# Patient Record
Sex: Female | Born: 1990 | Race: White | Hispanic: No | Marital: Married | Smoking: Former smoker
Health system: Southern US, Community
[De-identification: ages and names within clinical notes are randomized; demographics above are authoritative.]

## PROBLEM LIST (undated history)

## (undated) DIAGNOSIS — G43909 Migraine, unspecified, not intractable, without status migrainosus: Secondary | ICD-10-CM

## (undated) HISTORY — PX: TONSILLECTOMY: SUR1361

---
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
## 2008-01-10 ENCOUNTER — Ambulatory Visit: Payer: Self-pay | Admitting: Obstetrics and Gynecology

## 2008-01-10 IMAGING — US US PELV - US TRANSVAGINAL
1 series · 17 of 25 positions shown · non-contrast
Comparison: none

REASON FOR EXAM: pelvic pain
COMMENTS:

[Series 1: us pelv - us transvaginal · 17 of 46 slices shown]
[im 1/46]
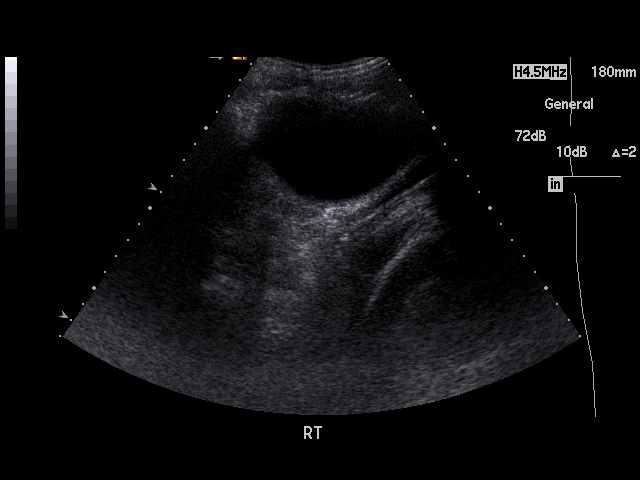
[im 4/46]
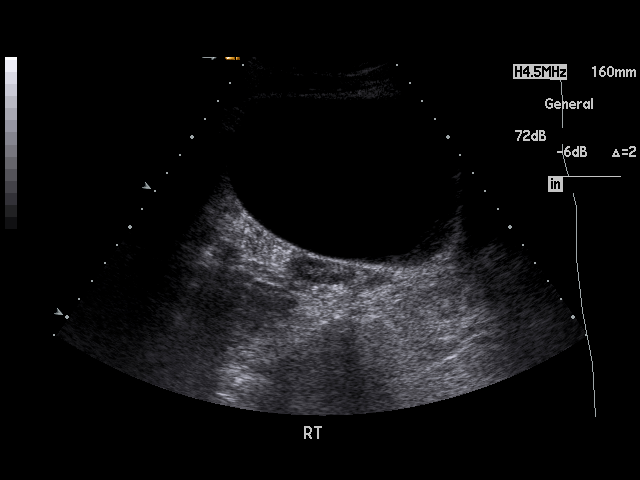
[im 6/46]
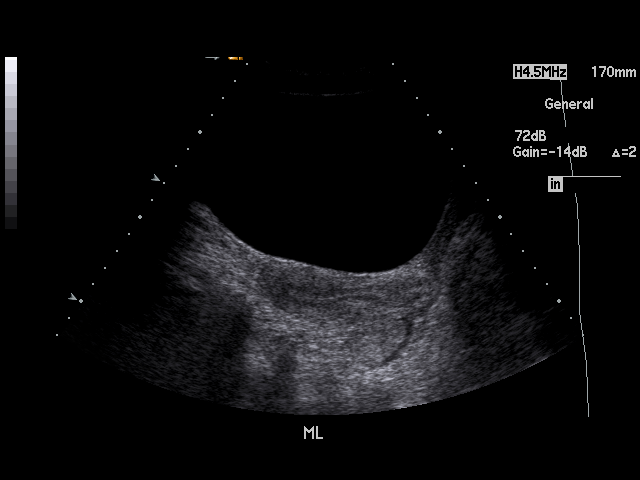
[im 10/46]
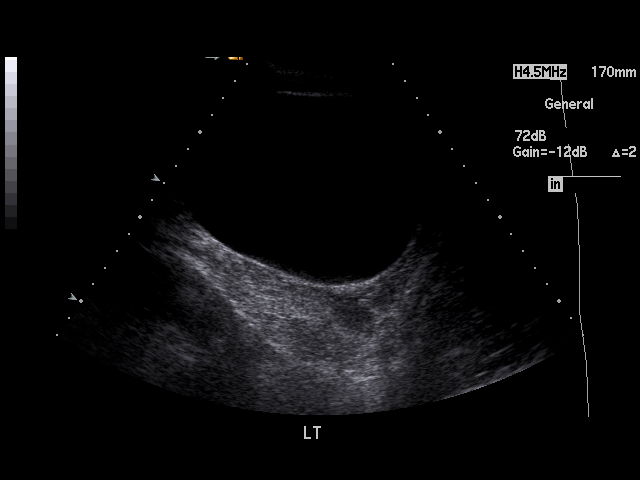
[im 12/46]
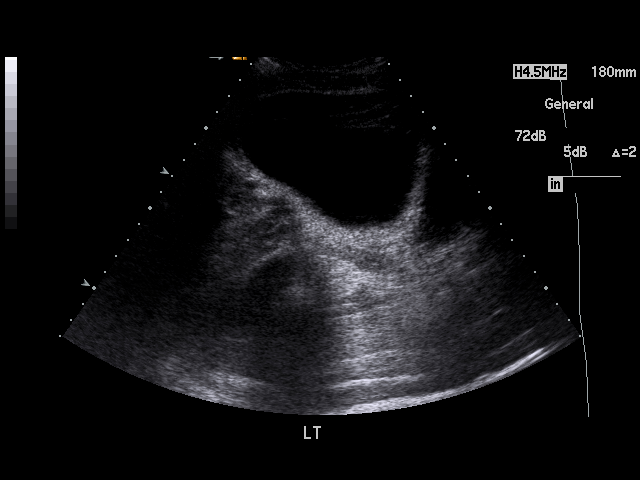
[im 16/46]
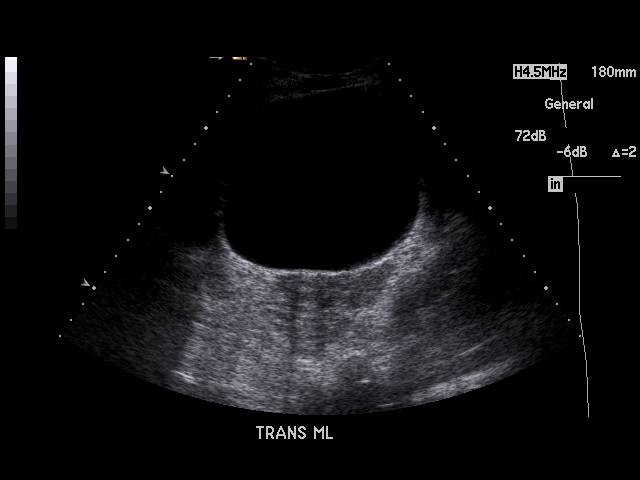
[im 17/46]
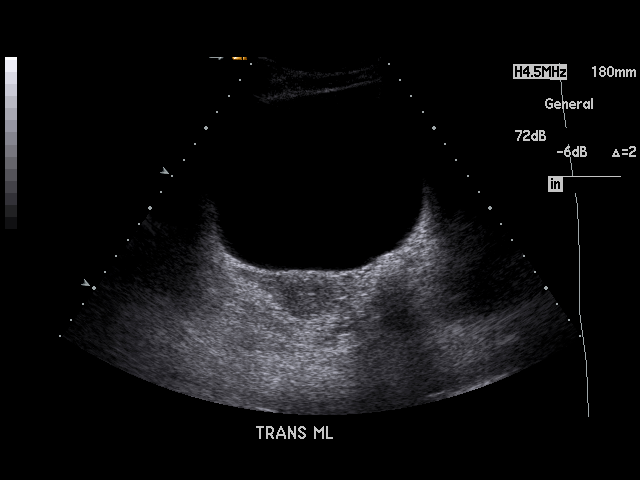
[im 21/46]
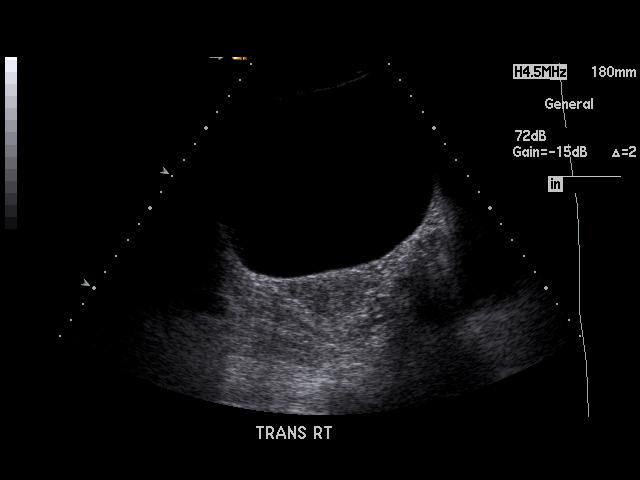
[im 23/46]
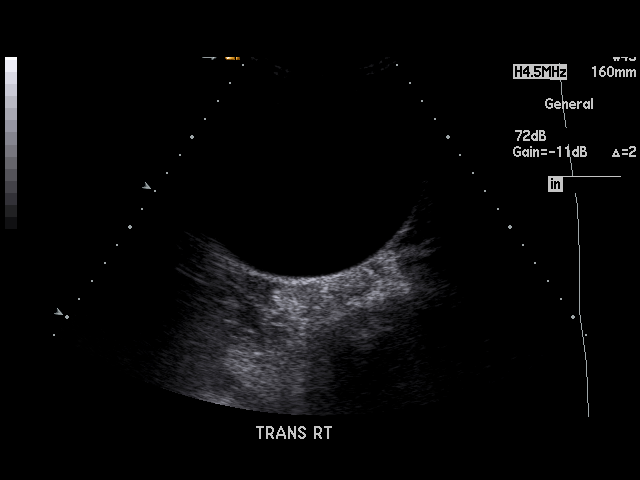
[im 25/46]
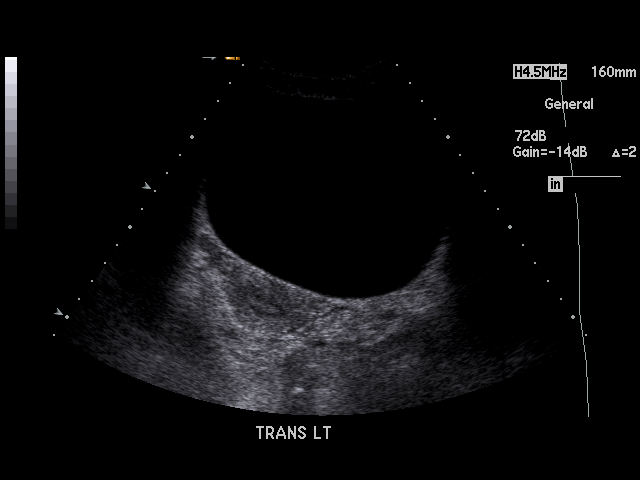
[im 29/46]
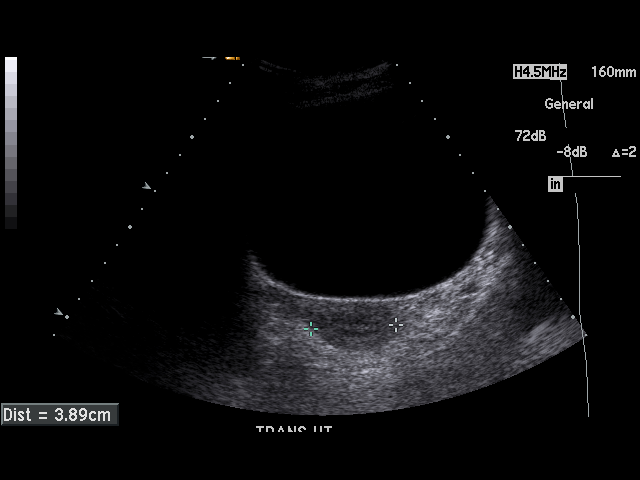
[im 31/46]
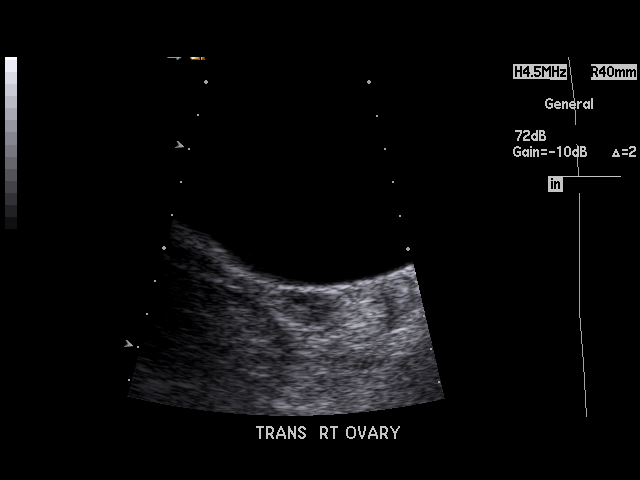
[im 34/46]
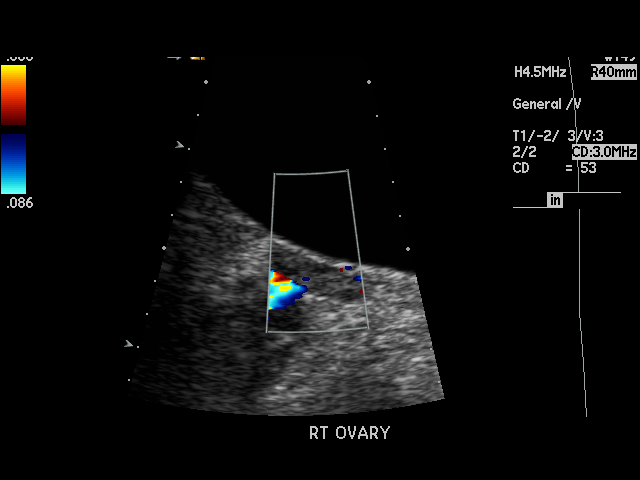
[im 36/46]
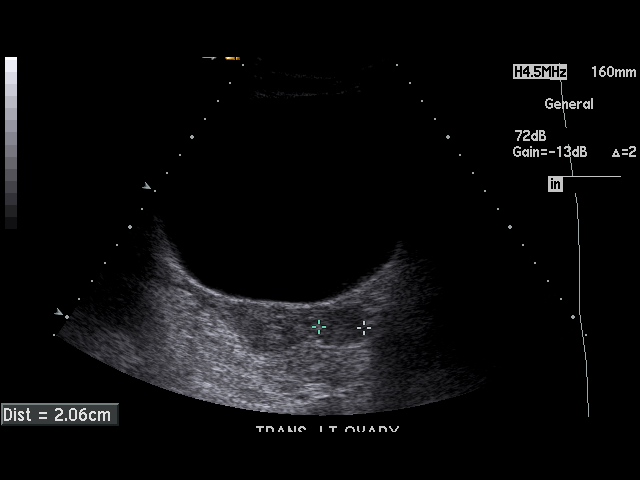
[im 40/46]
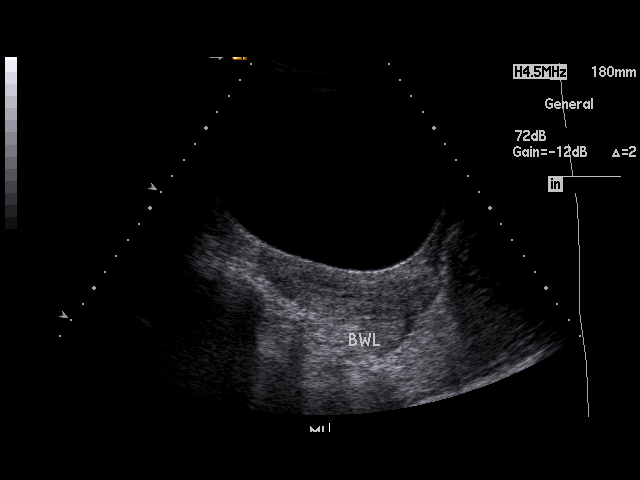
[im 42/46]
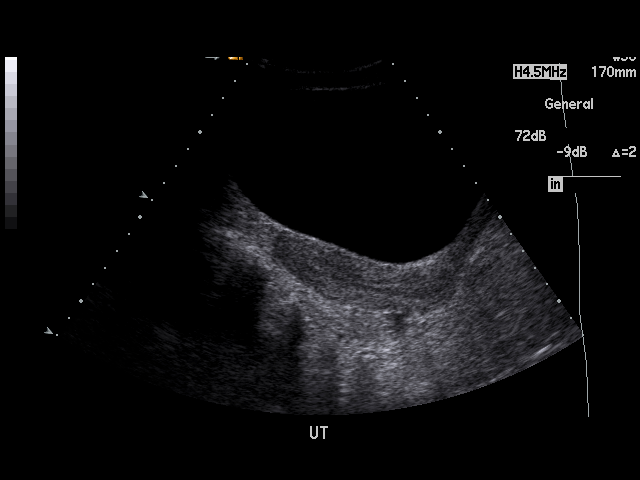
[im 46/46]
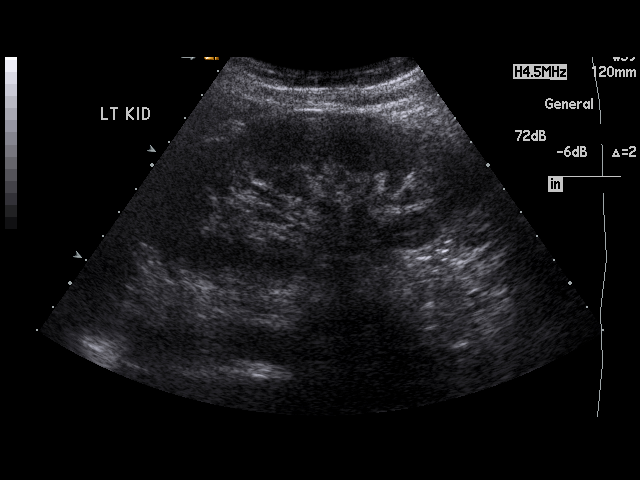

[17 of 25 positions shown; findings below may reference images not displayed]

PROCEDURE:     US  - US PELVIS MASS EXAM  - [DATE] [DATE] [DATE]  [DATE]

RESULT:     The uterus measures 8.75 cm x 4.41 cm x 2.53 cm. No uterine mass
lesions are seen. The endometrium measures 4.5 mm in thickness. The RIGHT
and LEFT ovaries are visualized. The RIGHT ovary measures 3.01 cm at maximum
diameter and the LEFT ovary measures 2.51 cm at maximum diameter.  No
abnormal adnexal masses are seen. There is a nonspecific trace of free fluid
in the cul-de-sac. The visualized portion of the urinary bladder is normal
in appearance. The kidneys are visualized bilaterally and show no
hydronephrosis.
IMPRESSION: 1.     No significant abnormalities are noted.

## 2008-03-19 ENCOUNTER — Emergency Department: Payer: Self-pay | Admitting: Emergency Medicine

## 2009-06-04 ENCOUNTER — Emergency Department: Payer: Self-pay | Admitting: Unknown Physician Specialty

## 2010-04-07 ENCOUNTER — Ambulatory Visit: Payer: Self-pay | Admitting: Gastroenterology

## 2010-04-22 ENCOUNTER — Ambulatory Visit: Payer: Self-pay | Admitting: Gastroenterology

## 2010-04-22 IMAGING — CR DG SMALL BOWEL
1 series · 4 of 4 positions shown · non-contrast
Comparison: none

REASON FOR EXAM: CHRONIC COLITIS
COMMENTS:

PROCEDURE:     FL  - FL SMALL BOWEL  - [DATE]  [DATE]
RESULT:     Indication: Chronic colitis

[Series 1: view not recorded · 0.17mm/px · 4 of 4 slices shown]
[im 1/4]
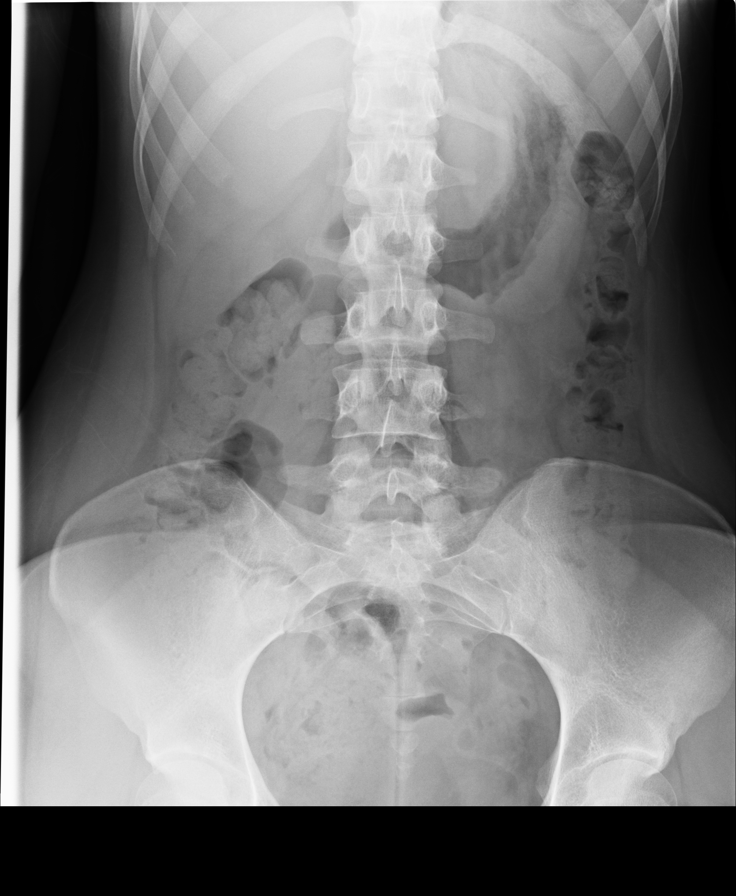
[im 2/4]
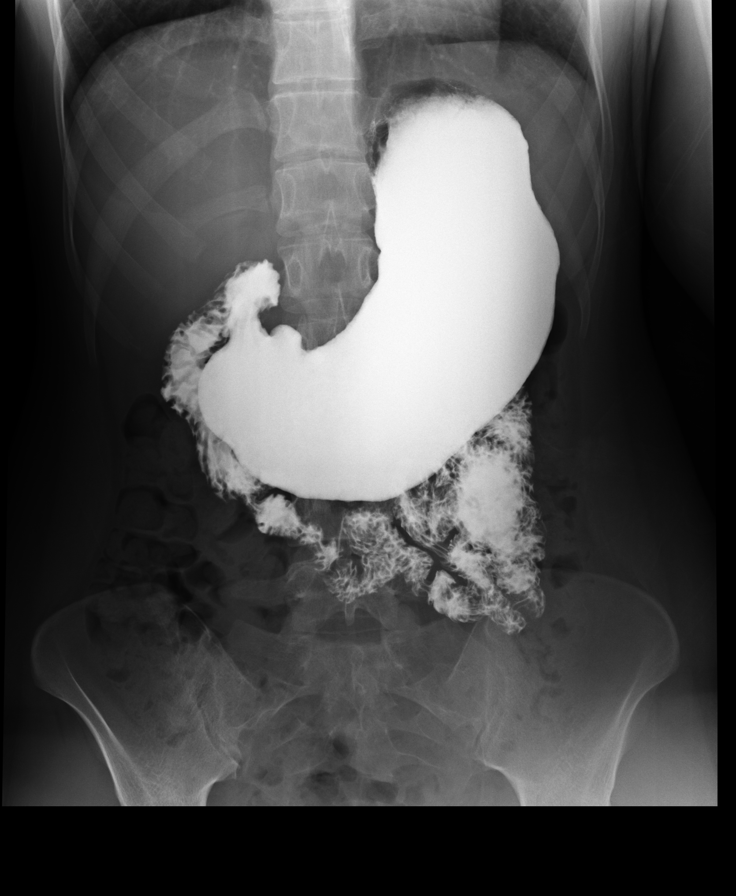
[im 3/4]
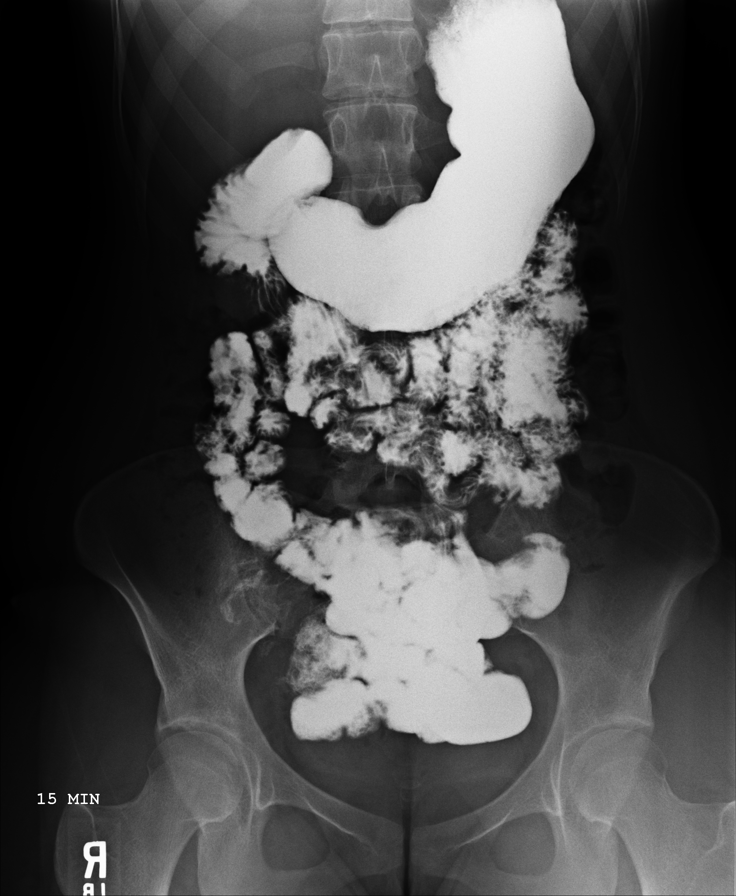
[im 4/4]
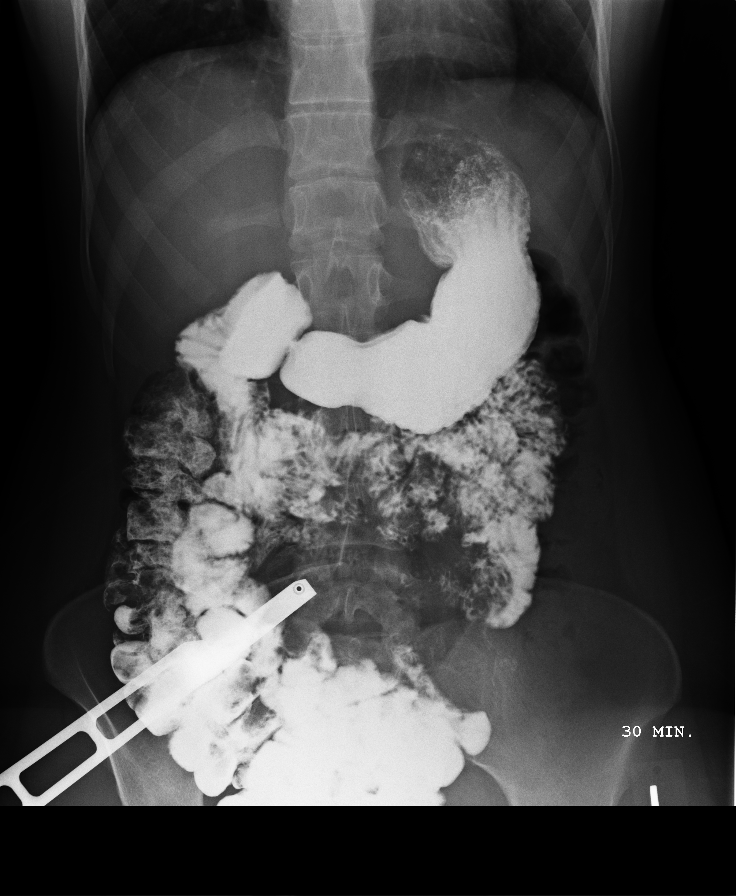

[4 of 4 positions shown; findings below may reference images not displayed]

FINDINGS: Scout frontal abdominal and pelvic radiograph demonstrates no focal
abnormality.

Medium density barium was periodically observed under fluoroscopy to travel
from the stomach to the ascending colon (over a 30 minute time period).
There is no evidence of small bowel stricture or obstruction. No large
filling defects to suggest mass lesion. In addition, there is no evidence of
tethering or definite inflammatory changes present within the small bowel.
IMPRESSION: Normal small bowel follow-through study without evidence of tethering, mass
lesion, or obstruction within the small bowel.

## 2010-04-22 IMAGING — RF DG SMALL BOWEL
1 series · 5 of 5 positions shown · non-contrast
Comparison: none

REASON FOR EXAM: CHRONIC COLITIS
COMMENTS:

PROCEDURE:     FL  - FL SMALL BOWEL  - [DATE]  [DATE]
RESULT:     Indication: Chronic colitis

[Series 1: run · 5 of 5 slices shown]
[im 1/5]
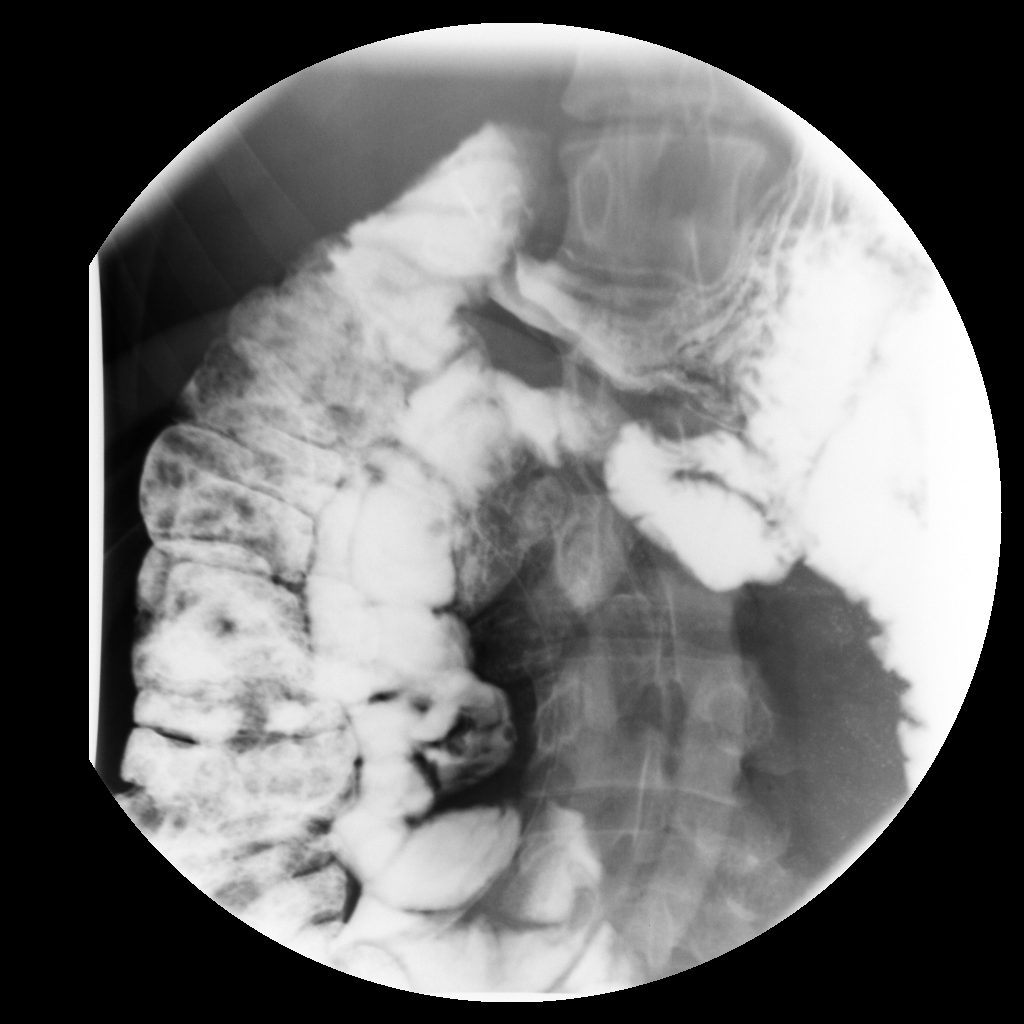
[im 2/5]
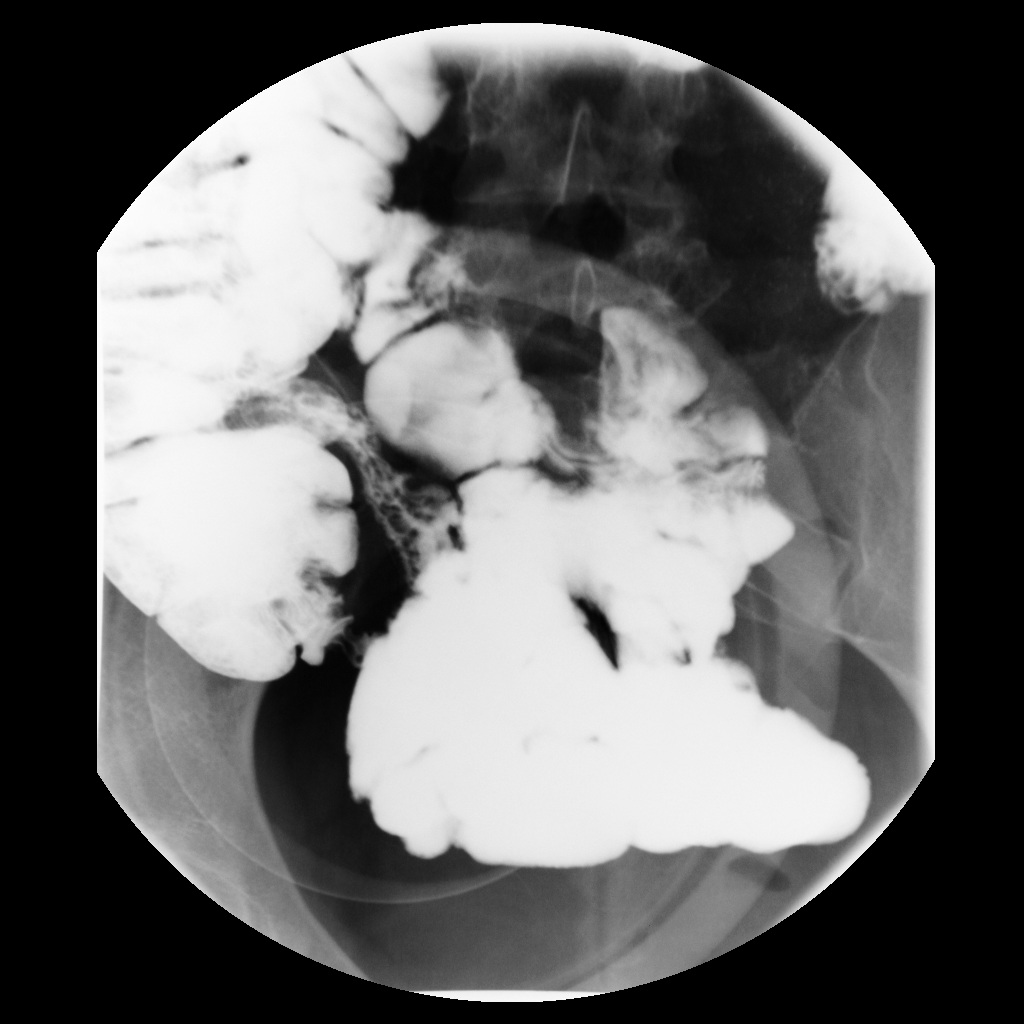
[im 3/5]
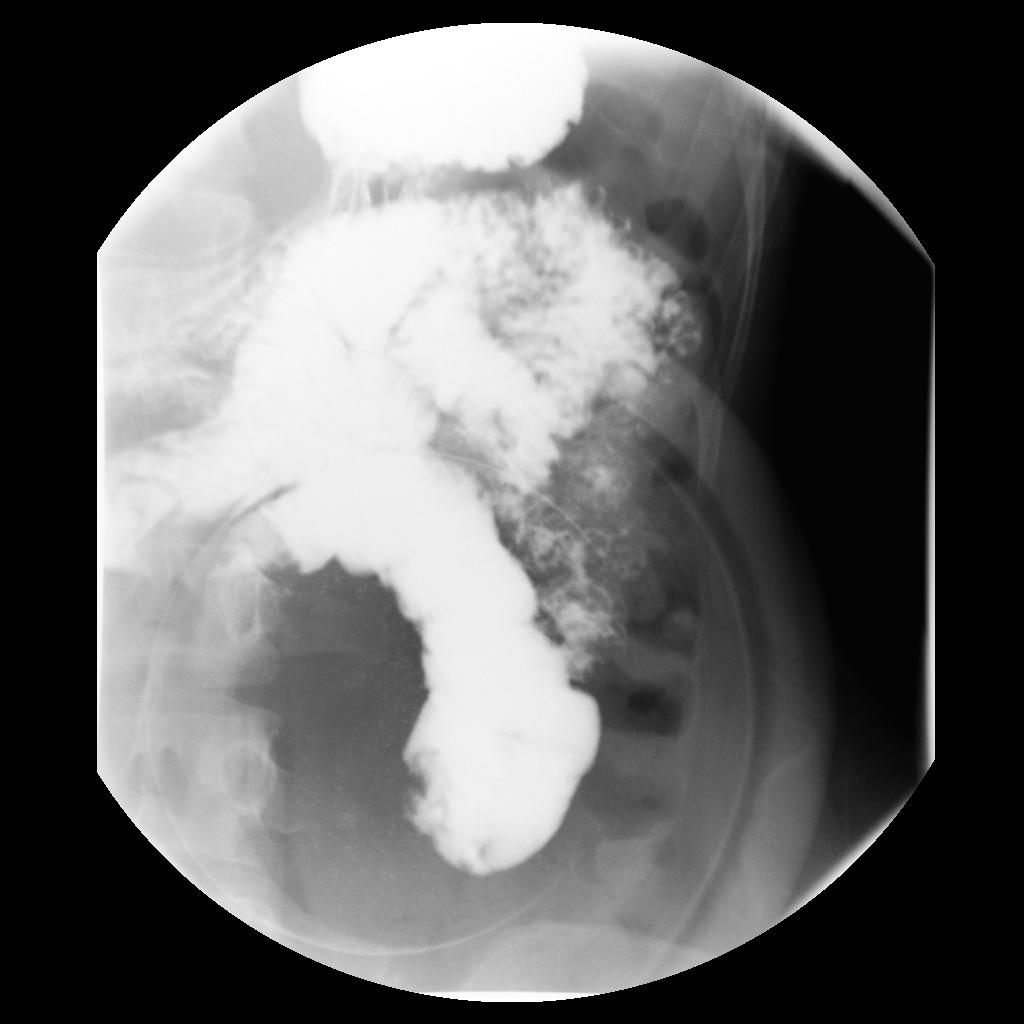
[im 4/5]
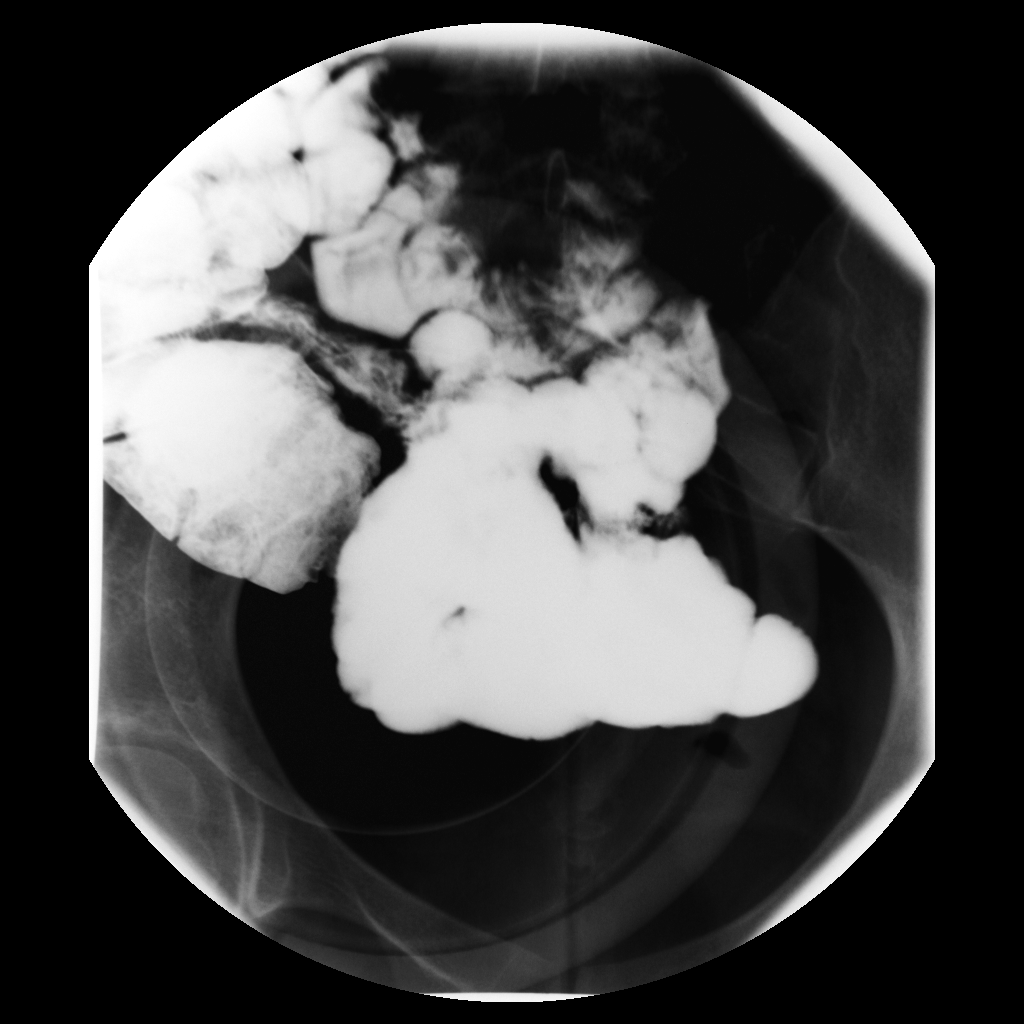
[im 5/5]
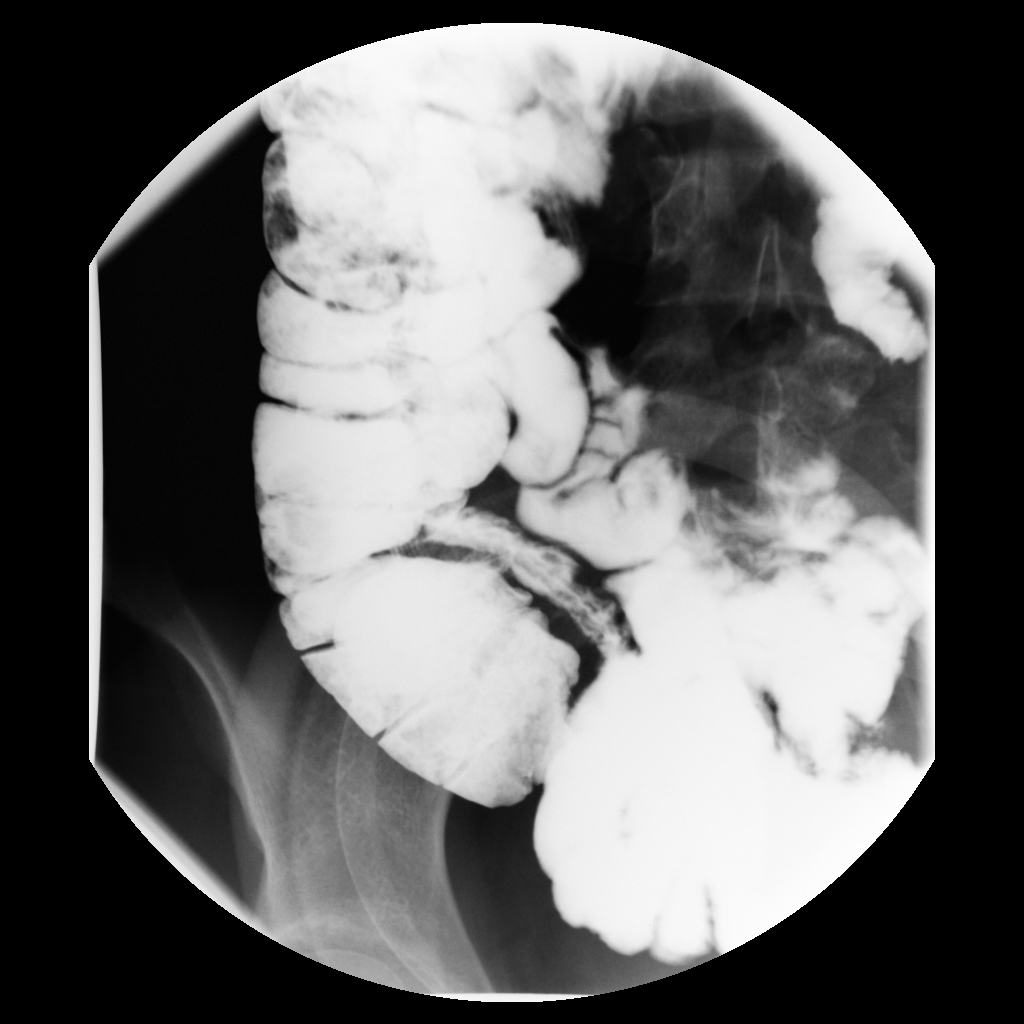

[5 of 5 positions shown; findings below may reference images not displayed]

FINDINGS: Scout frontal abdominal and pelvic radiograph demonstrates no focal
abnormality.

Medium density barium was periodically observed under fluoroscopy to travel
from the stomach to the ascending colon (over a 30 minute time period).
There is no evidence of small bowel stricture or obstruction. No large
filling defects to suggest mass lesion. In addition, there is no evidence of
tethering or definite inflammatory changes present within the small bowel.
IMPRESSION: Normal small bowel follow-through study without evidence of tethering, mass
lesion, or obstruction within the small bowel.

## 2010-05-14 ENCOUNTER — Ambulatory Visit: Payer: Self-pay | Admitting: Gastroenterology

## 2010-05-19 ENCOUNTER — Ambulatory Visit: Payer: Self-pay | Admitting: Gastroenterology

## 2010-05-19 IMAGING — CT CT ABD-PELV W/ CM
1 of 2 series · 15 of 32 positions shown, 19 images · non-contrast
Comparison: none

REASON FOR EXAM: LLQ abd pain
COMMENTS:

PROCEDURE:     CT  - CT ABDOMEN / PELVIS  W  - [DATE]  [DATE]
RESULT:
TECHNIQUE: CT of the abdomen and pelvis is performed utilizing 85 ml of
[7O] iodinated intravenous contrast along with oral contrast. Images
are reconstructed in the axial plane at 5 mm slice thickness.
There is no previous exam for comparison.

[Series 2: soft tissue · axial · 0.69mm/px · z∈[-310,+76]mm · 15 of 85 slices shown, 19 images]
[im 4/85  soft-tissue]
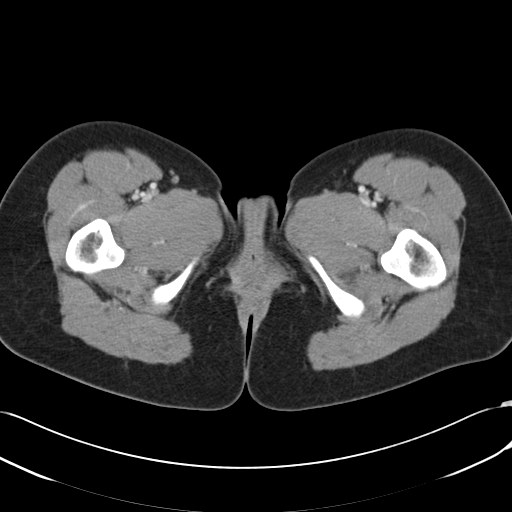
[im 4/85  bone]
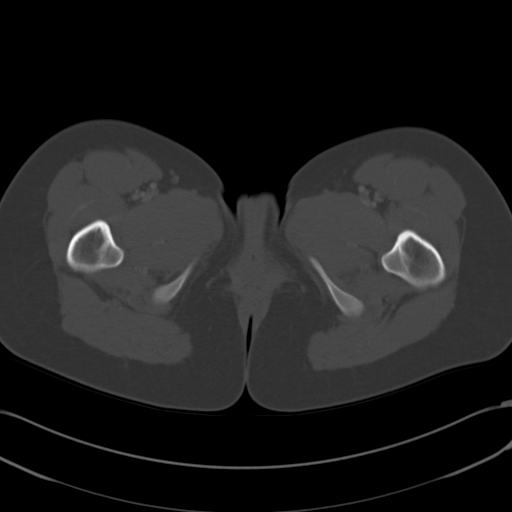
[im 11/85  soft-tissue]
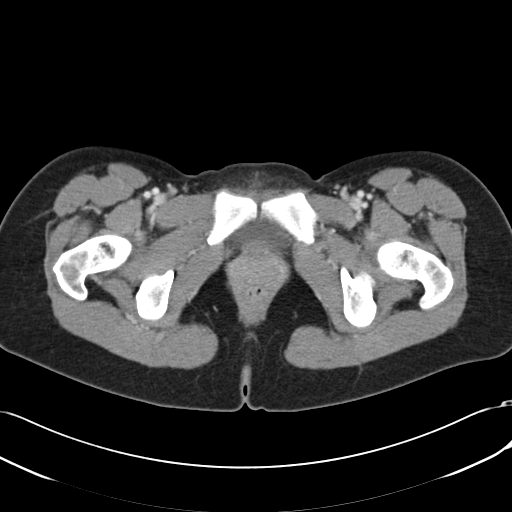
[im 17/85  soft-tissue]
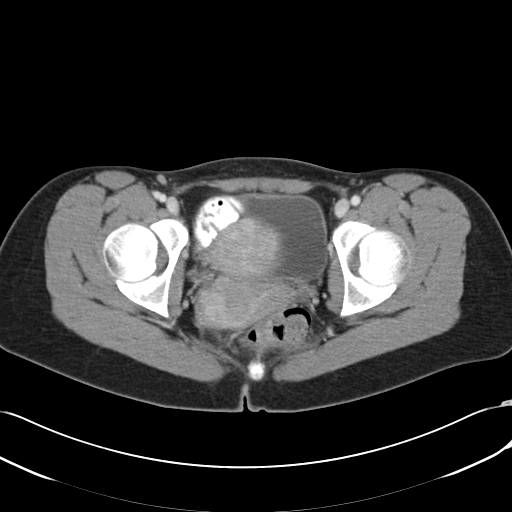
[im 24/85  soft-tissue]
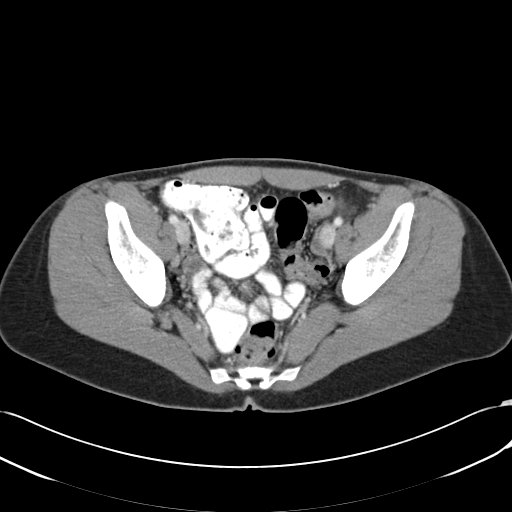
[im 31/85  soft-tissue]
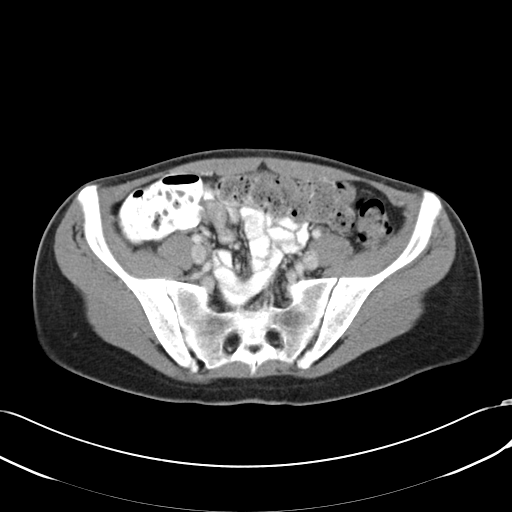
[im 37/85  soft-tissue]
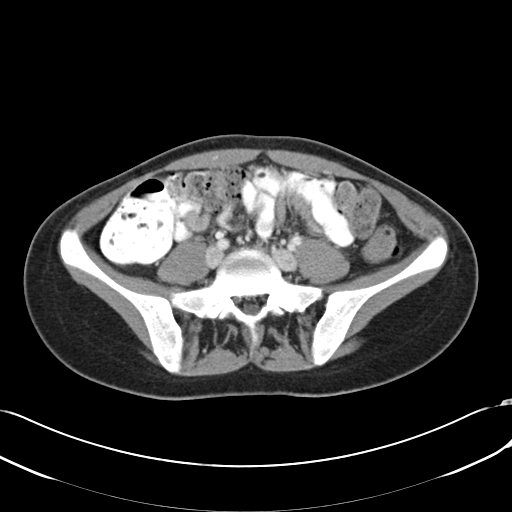
[im 44/85  soft-tissue]
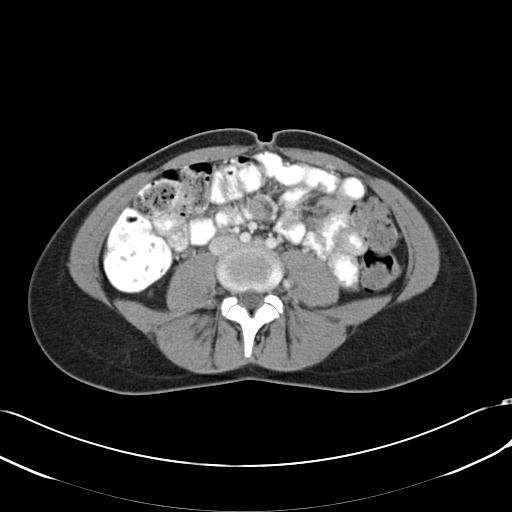
[im 48/85  soft-tissue]
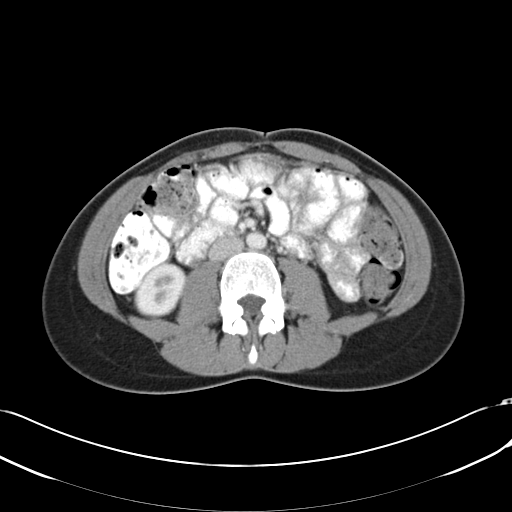
[im 54/85  soft-tissue]
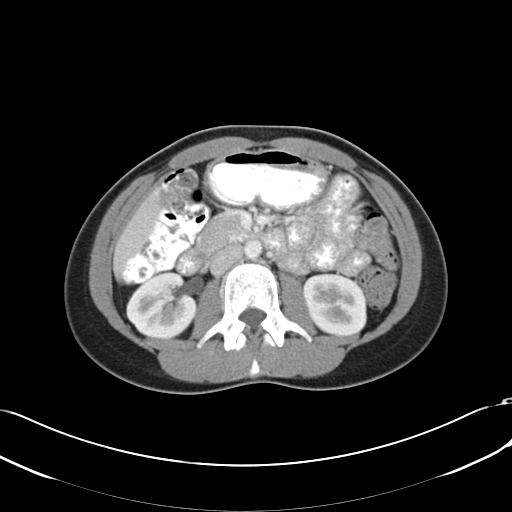
[im 54/85  bone]
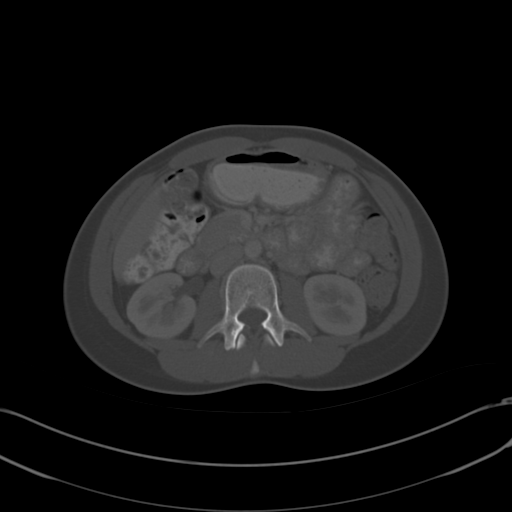
[im 61/85  soft-tissue]
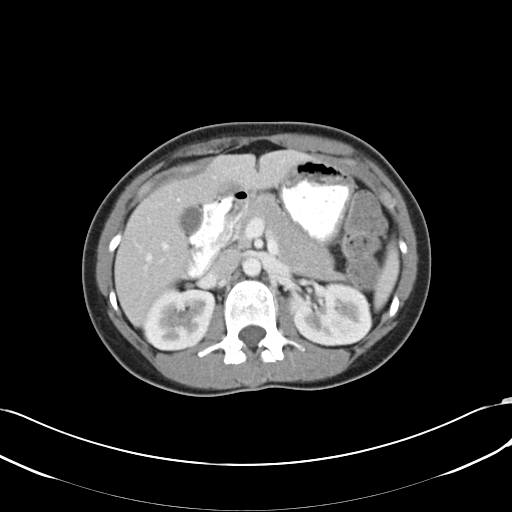
[im 68/85  soft-tissue]
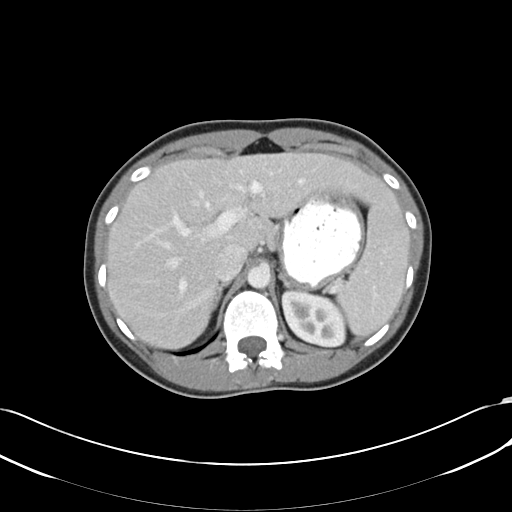
[im 71/85  lung]
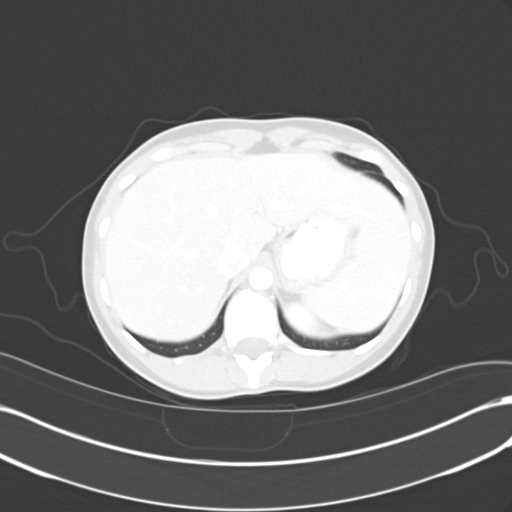
[im 74/85  soft-tissue]
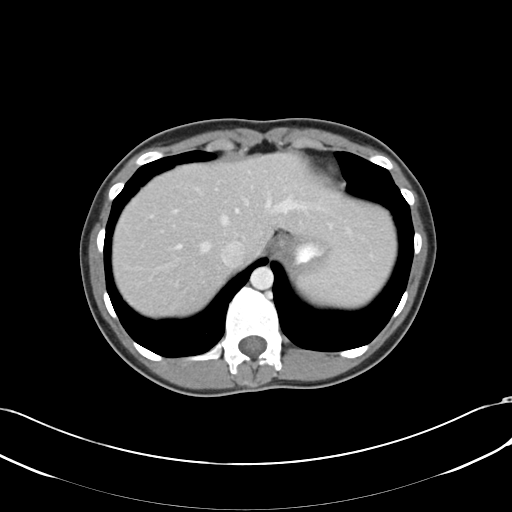
[im 74/85  lung]
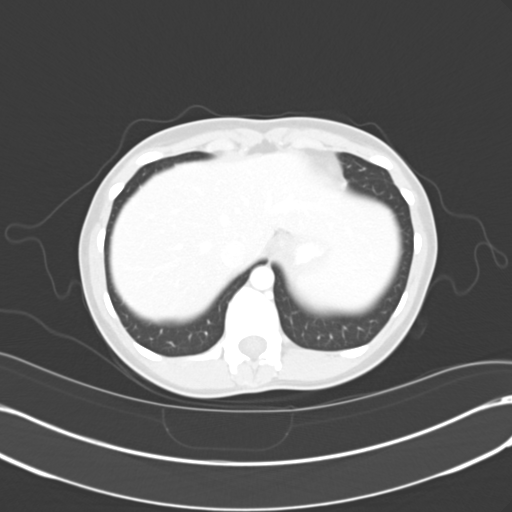
[im 78/85  lung]
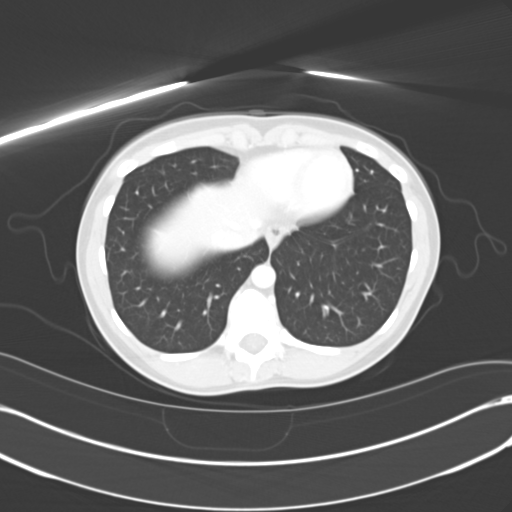
[im 81/85  soft-tissue]
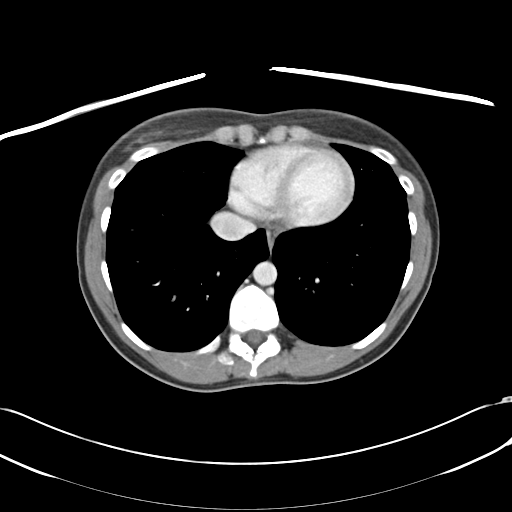
[im 81/85  lung]
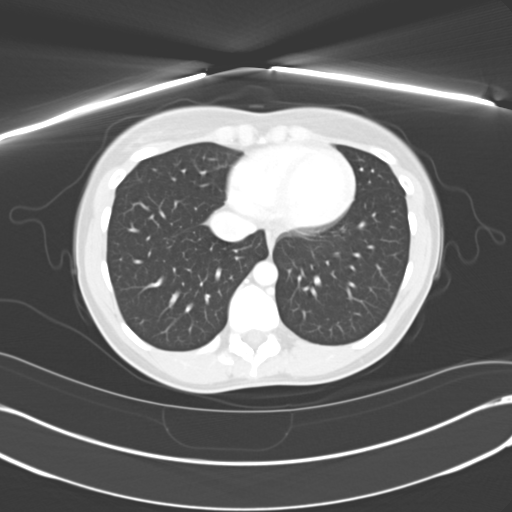

[15 of 32 positions shown; findings below may reference images not displayed]

FINDINGS: Images through the base of the lungs demonstrate normal appearing
aeration. There is no pleural or pericardial effusion. The liver and spleen
appear to be within normal limits. The pancreas, gallbladder, adrenal
glands, kidneys, aorta and bowel appear to be unremarkable. There is no
abnormal bowel distention, wall thickening, abnormal fluid collection or
free air. The urinary bladder is nondistended. The uterus appears to be
within normal limits. There is no free air or free fluid. There is what
appears to be a normal appearing appendix. No adenopathy is evident.
IMPRESSION: No acute abnormality demonstrated in the abdomen or pelvis.
No findings to suggest colitis or diverticulitis. No enteritis evident.

## 2012-09-15 LAB — COMPREHENSIVE METABOLIC PANEL
Alkaline Phosphatase: 122 U/L (ref 50–136)
BUN: 8 mg/dL (ref 7–18)
Bilirubin,Total: 0.5 mg/dL (ref 0.2–1.0)
Calcium, Total: 9.1 mg/dL (ref 8.5–10.1)
Chloride: 107 mmol/L (ref 98–107)
Co2: 22 mmol/L (ref 21–32)
Creatinine: 0.65 mg/dL (ref 0.60–1.30)
EGFR (African American): >60
EGFR (Non-African Amer.): >60
Osmolality: 274 (ref 275–301)
SGOT(AST): 23 U/L (ref 15–37)
SGPT (ALT): 18 U/L (ref 12–78)
Sodium: 138 mmol/L (ref 136–145)
Total Protein: 8.7 g/dL — ABNORMAL HIGH (ref 6.4–8.2)

## 2012-09-15 LAB — TSH: Thyroid Stimulating Horm: 0.69 u[IU]/mL

## 2012-09-15 LAB — DRUG SCREEN, URINE
Amphetamines, Ur Screen: NEGATIVE (ref ?–1000)
Barbiturates, Ur Screen: NEGATIVE (ref ?–200)
Benzodiazepine, Ur Scrn: NEGATIVE (ref ?–200)
Cannabinoid 50 Ng, Ur ~~LOC~~: NEGATIVE (ref ?–50)
Cocaine Metabolite,Ur ~~LOC~~: NEGATIVE (ref ?–300)
MDMA (Ecstasy)Ur Screen: NEGATIVE (ref ?–500)
Tricyclic, Ur Screen: NEGATIVE (ref ?–1000)

## 2012-09-15 LAB — URINALYSIS, COMPLETE
Bacteria: NONE SEEN
Bilirubin,UR: NEGATIVE
Glucose,UR: NEGATIVE mg/dL (ref 0–75)
Leukocyte Esterase: NEGATIVE
Nitrite: NEGATIVE
Protein: NEGATIVE
RBC,UR: 10 /HPF (ref 0–5)
Specific Gravity: 1.043 (ref 1.003–1.030)
Squamous Epithelial: 2
WBC UR: 1 /HPF (ref 0–5)

## 2012-09-15 LAB — ACETAMINOPHEN LEVEL: Acetaminophen: 82 ug/mL — ABNORMAL HIGH

## 2012-09-15 LAB — ETHANOL
Ethanol %: <0.003 % (ref 0.000–0.080)
Ethanol: <3 mg/dL

## 2012-09-15 LAB — CBC
HGB: 15 g/dL (ref 12.0–16.0)
MCH: 29.3 pg (ref 26.0–34.0)
MCV: 85 fL (ref 80–100)
RBC: 5.11 10*6/uL (ref 3.80–5.20)

## 2012-09-16 ENCOUNTER — Inpatient Hospital Stay: Payer: Self-pay | Admitting: Psychiatry
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
## 2018-09-24 ENCOUNTER — Emergency Department: Payer: BC Managed Care – PPO

## 2018-09-24 ENCOUNTER — Emergency Department
Admission: EM | Admit: 2018-09-24 | Discharge: 2018-09-24 | Disposition: A | Discharge status: Home / Self Care | Payer: BC Managed Care – PPO | Attending: Student in an Organized Health Care Education/Training Program | Admitting: Student in an Organized Health Care Education/Training Program

## 2018-09-24 ENCOUNTER — Other Ambulatory Visit: Payer: Self-pay

## 2018-09-24 DIAGNOSIS — R0602 Shortness of breath: Secondary | ICD-10-CM | POA: Insufficient documentation

## 2018-09-24 LAB — BASIC METABOLIC PANEL
Anion gap: 9 (ref 5–15)
BUN: 11 mg/dL (ref 6–20)
CALCIUM: 9.4 mg/dL (ref 8.9–10.3)
CHLORIDE: 105 mmol/L (ref 98–111)
CO2: 24 mmol/L (ref 22–32)
CREATININE: 0.61 mg/dL (ref 0.44–1.00)
GFR calc Af Amer: >60 mL/min (ref >60)
GFR calc non Af Amer: >60 mL/min (ref >60)
Glucose, Bld: 91 mg/dL (ref 70–99)
Potassium: 3.9 mmol/L (ref 3.5–5.1)
Sodium: 138 mmol/L (ref 135–145)

## 2018-09-24 LAB — FIBRIN DERIVATIVES D-DIMER (ARMC ONLY): Fibrin derivatives D-dimer (ARMC): 262.16 ng/mL (FEU) (ref 0.00–499.00)

## 2018-09-24 LAB — CBC
HEMATOCRIT: 42.6 % (ref 36.0–46.0)
Hemoglobin: 14.1 g/dL (ref 12.0–15.0)
MCH: 28.8 pg (ref 26.0–34.0)
MCHC: 33.1 g/dL (ref 30.0–36.0)
MCV: 86.9 fL (ref 80.0–100.0)
PLATELETS: 267 10*3/uL (ref 150–400)
RBC: 4.9 MIL/uL (ref 3.87–5.11)
RDW: 13 % (ref 11.5–15.5)
WBC: 10.9 10*3/uL — ABNORMAL HIGH (ref 4.0–10.5)
nRBC: 0 % (ref 0.0–0.2)

## 2018-09-24 LAB — HCG, QUANTITATIVE, PREGNANCY: hCG, Beta Chain, Quant, S: <1 m[IU]/mL (ref <5)

## 2018-09-24 LAB — TROPONIN I: Troponin I: <0.03 ng/mL (ref <0.03)

## 2018-09-24 IMAGING — CR DG CHEST 2V
1 series · 2 of 2 positions shown · non-contrast
Comparison: None.

CLINICAL DATA: Acute chest pain and shortness of breath.

EXAM:
CHEST - 2 VIEW

[Series 1: dg chest 2 view · 0.14mm/px · 2 of 2 slices shown]
[im 1/2]
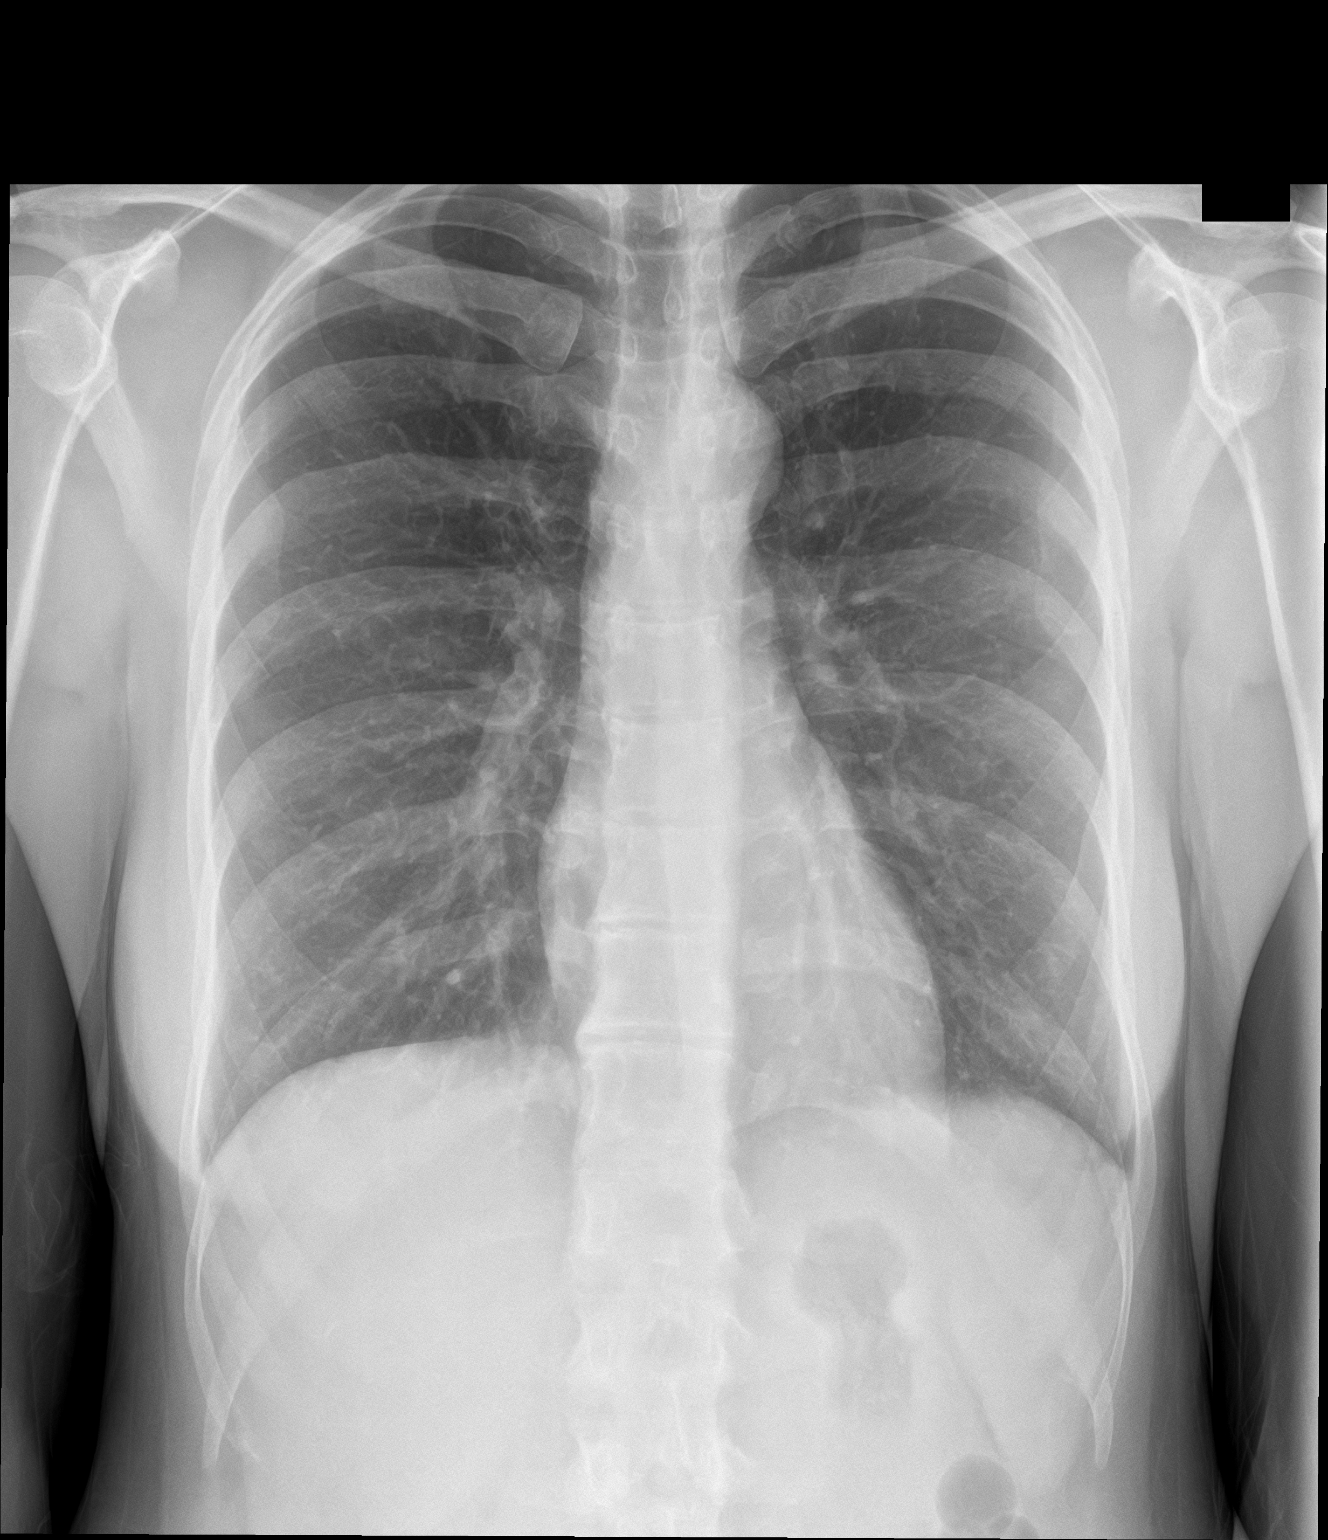
[im 2/2]
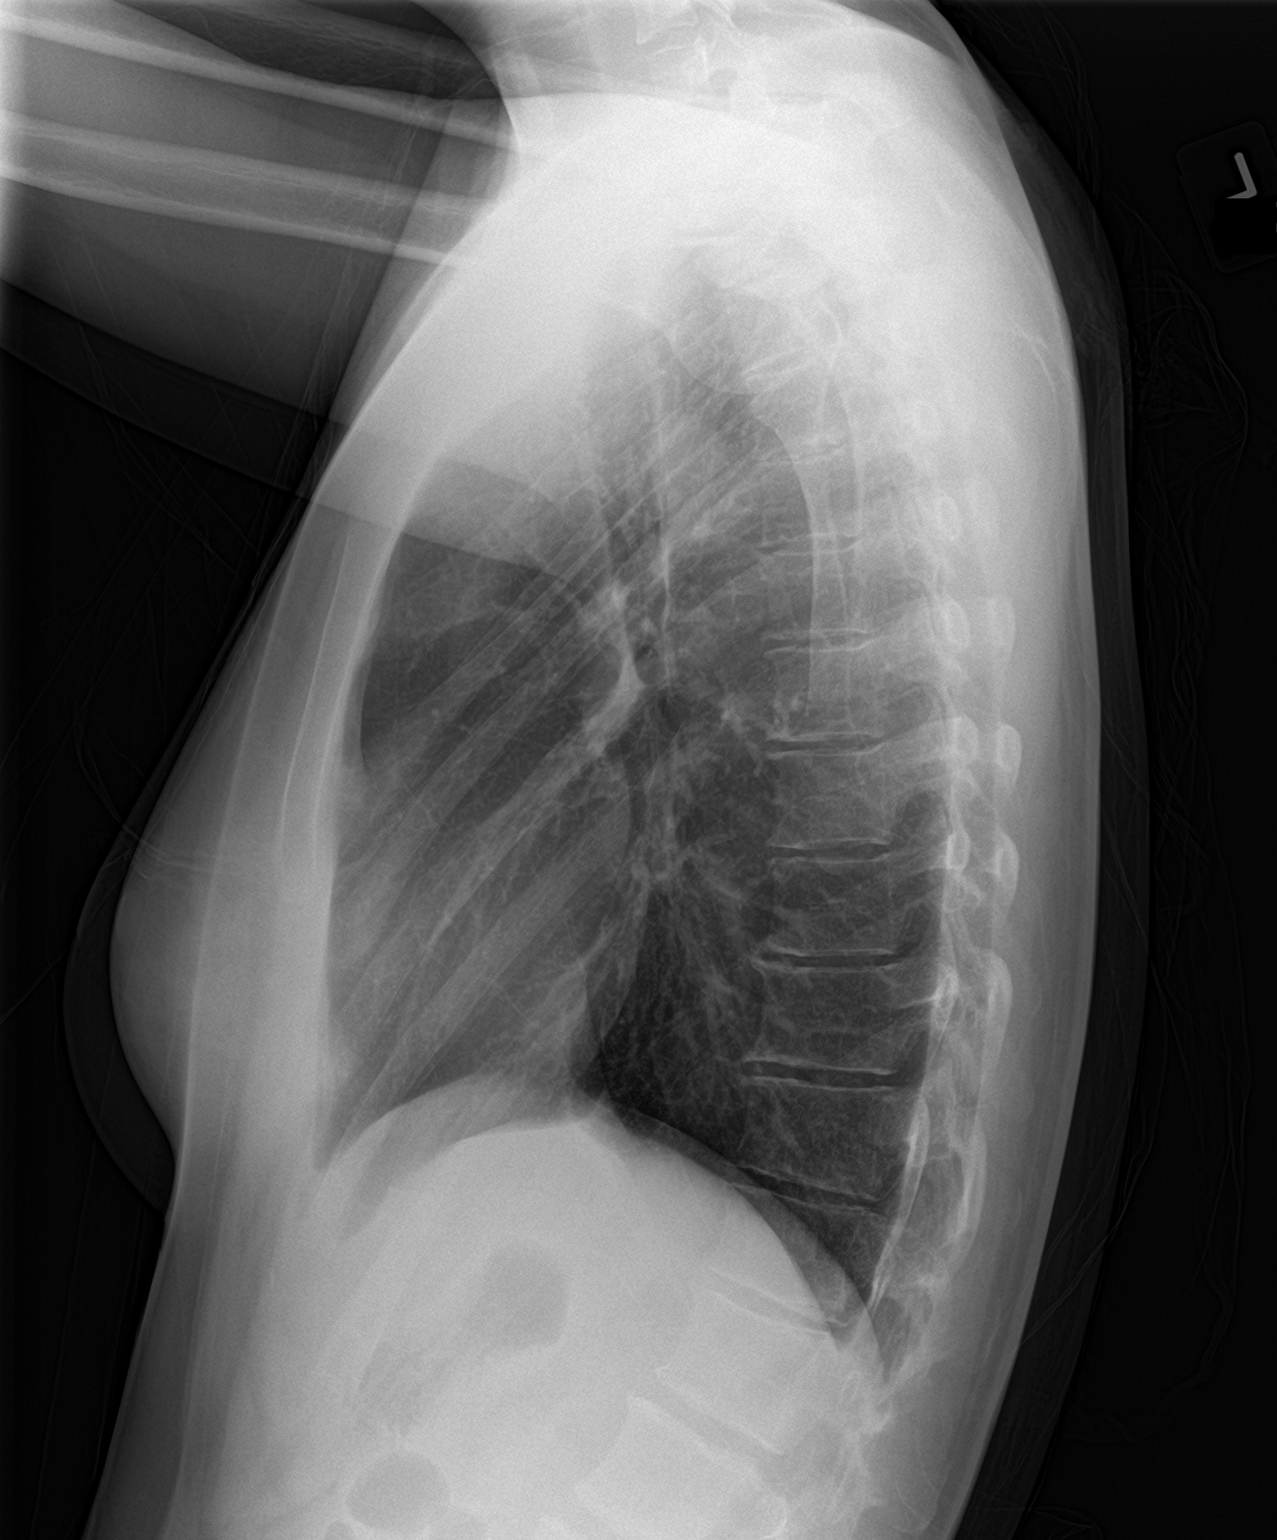

[2 of 2 positions shown; findings below may reference images not displayed]

FINDINGS: The cardiomediastinal silhouette is unremarkable.

There is no evidence of focal airspace disease, pulmonary edema,
suspicious pulmonary nodule/mass, pleural effusion, or pneumothorax.

No acute bony abnormalities are identified.
IMPRESSION: No active cardiopulmonary disease.

## 2018-09-24 IMAGING — CT CT ANGIO CHEST
2 of 7 series · 19 of 46 positions shown · IV contrast (APPLIED)
Comparison: Chest x-ray [DATE]

CLINICAL DATA: Painful breathing.  Shortness of breath.

EXAM:
CT ANGIOGRAPHY CHEST WITH CONTRAST
TECHNIQUE: Multidetector CT imaging of the chest was performed using the
standard protocol during bolus administration of intravenous
contrast. Multiplanar CT image reconstructions and MIPs were
obtained to evaluate the vascular anatomy.
CONTRAST:  75mL OMNIPAQUE IOHEXOL 350 MG/ML SOLN

[Series 6: thins · axial · 0.54mm/px · z∈[-224,+31]mm · 16 of 285 slices shown]
[im 15/285  lung]
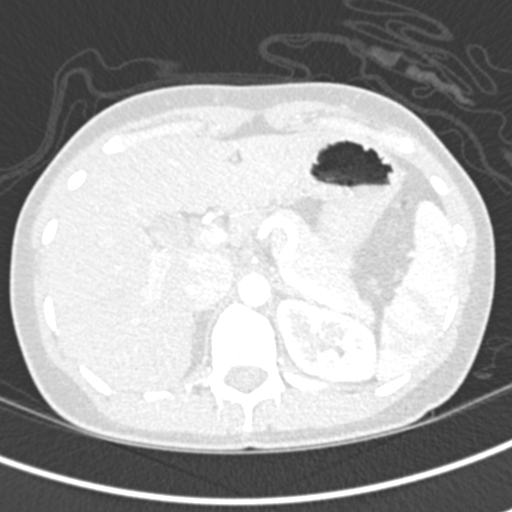
[im 30/285  soft-tissue]
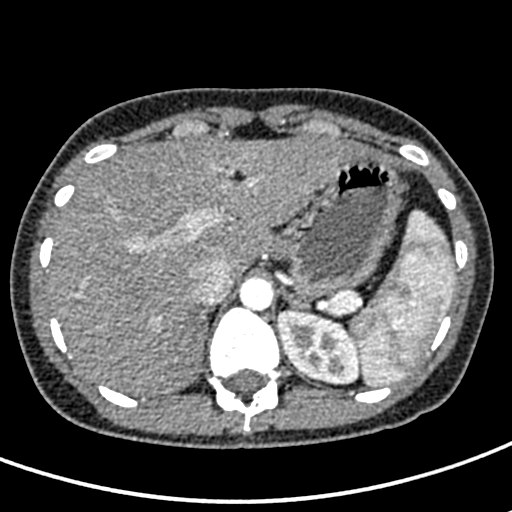
[im 45/285  lung]
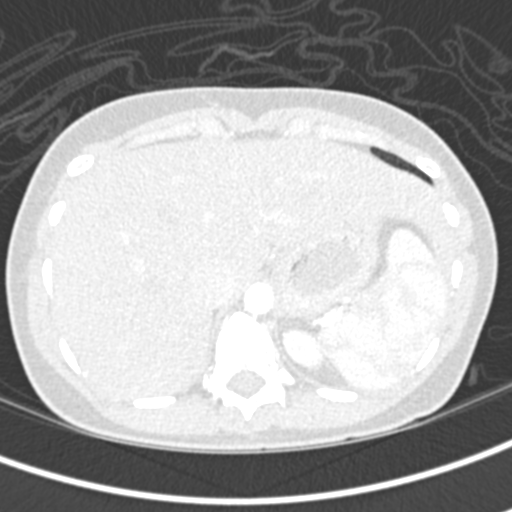
[im 60/285  soft-tissue]
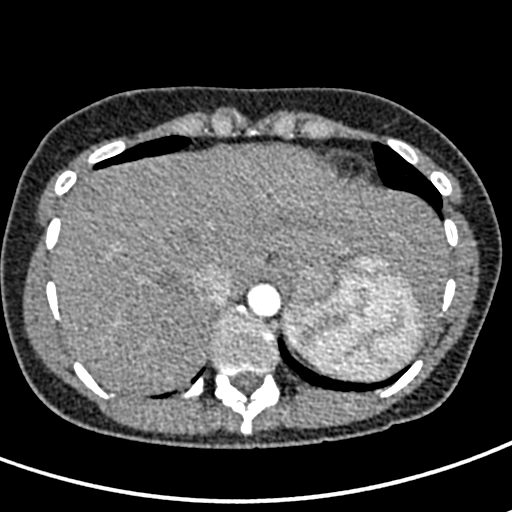
[im 90/285  lung]
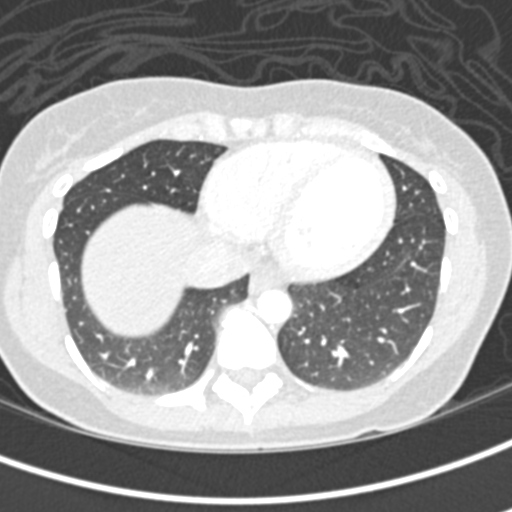
[im 105/285  soft-tissue]
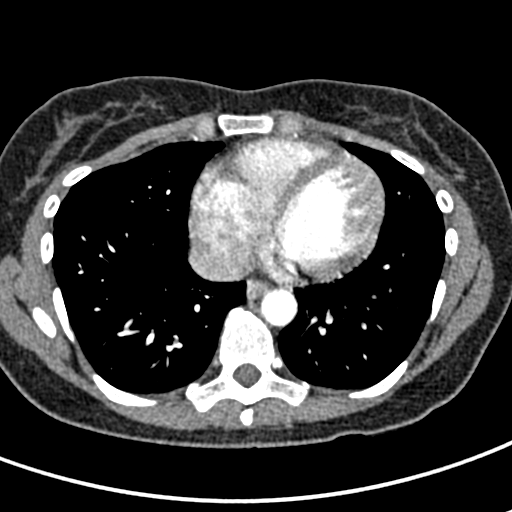
[im 120/285  lung]
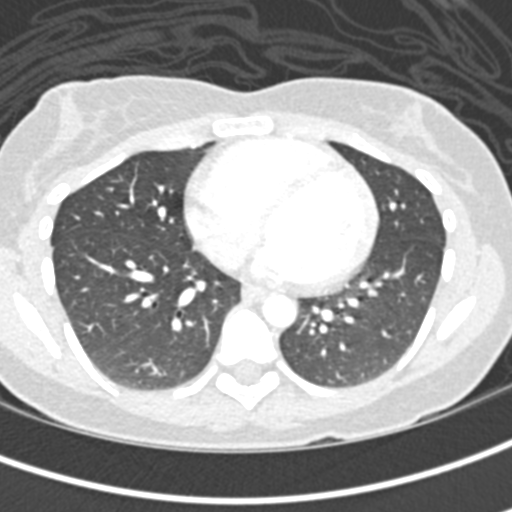
[im 135/285  soft-tissue]
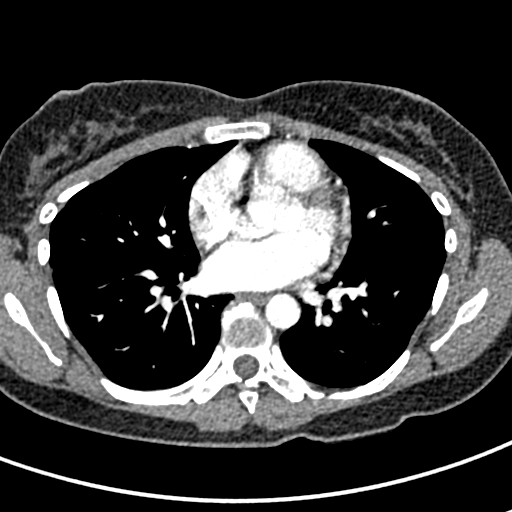
[im 150/285  lung]
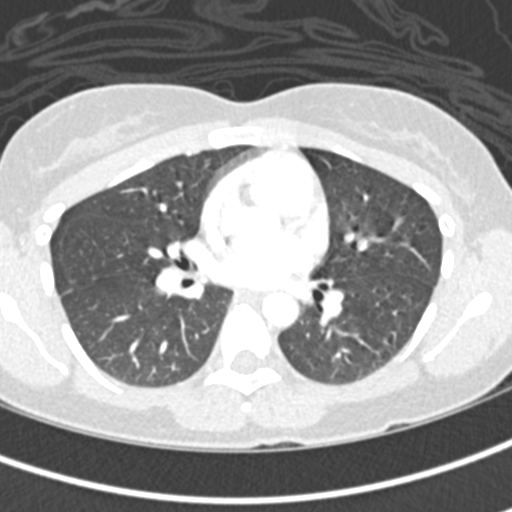
[im 165/285  soft-tissue]
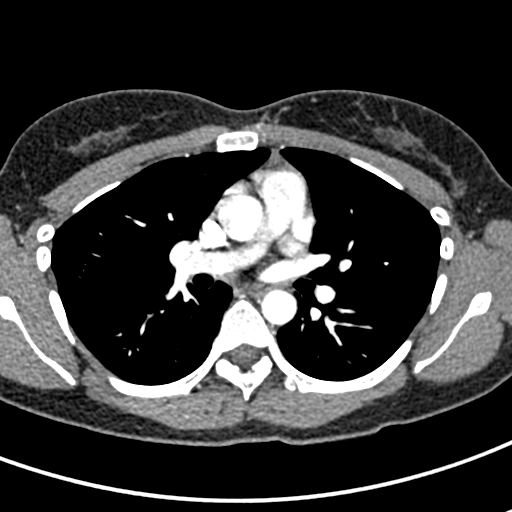
[im 180/285  lung]
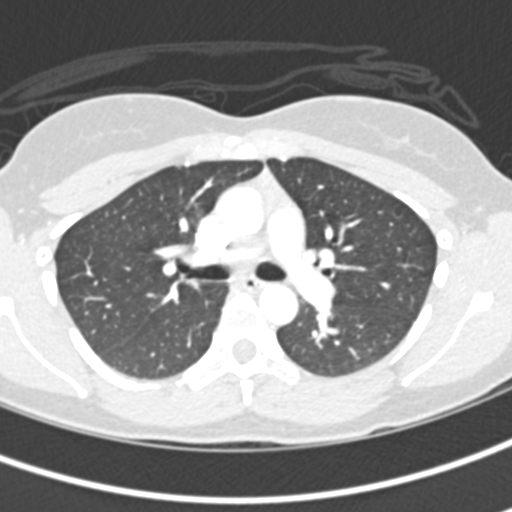
[im 195/285  soft-tissue]
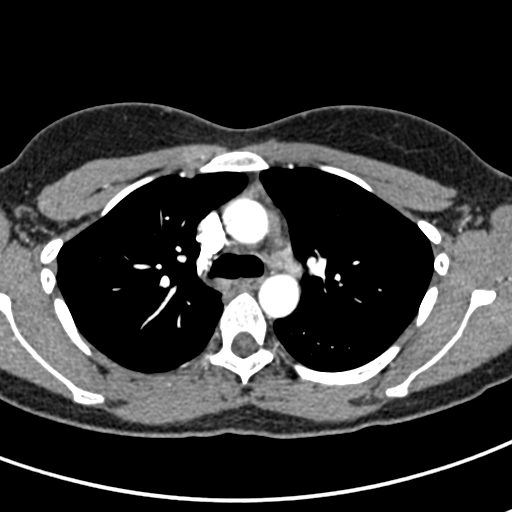
[im 225/285  lung]
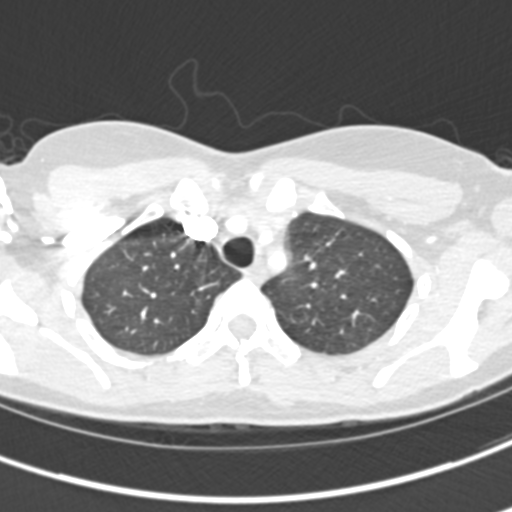
[im 240/285  soft-tissue]
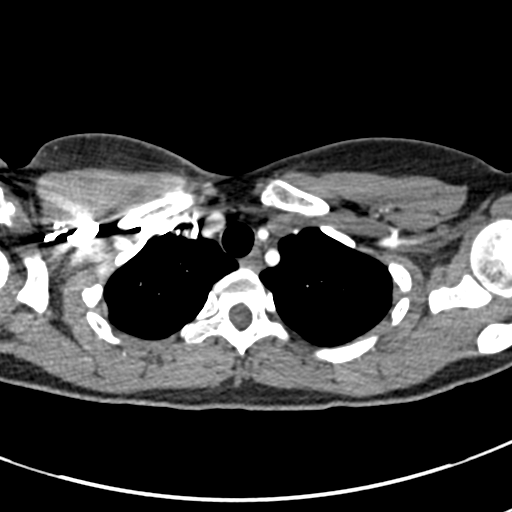
[im 255/285  lung]
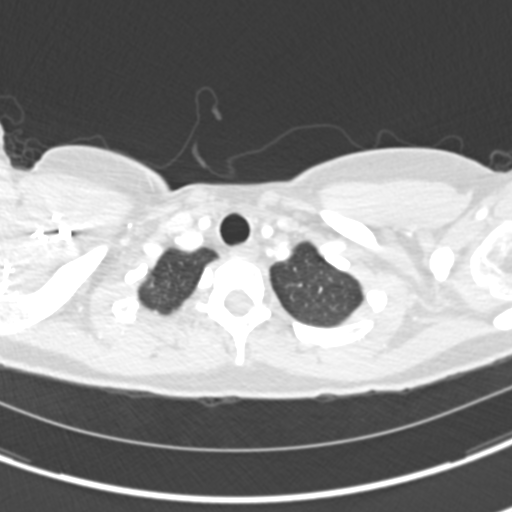
[im 270/285  soft-tissue]
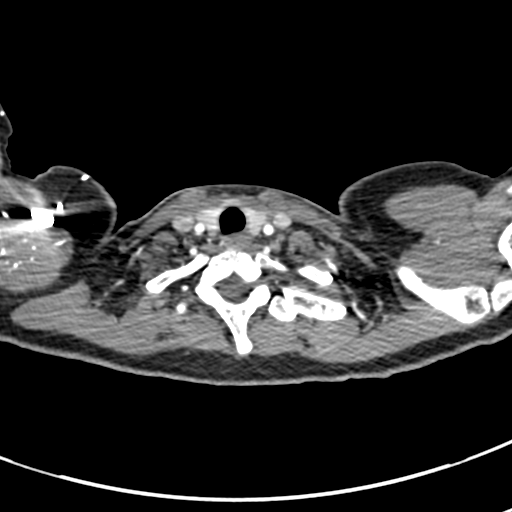

[Series 11: coronal mpr · coronal · 0.52mm/px · 3 of 65 slices shown]
[im 17/65  soft-tissue]
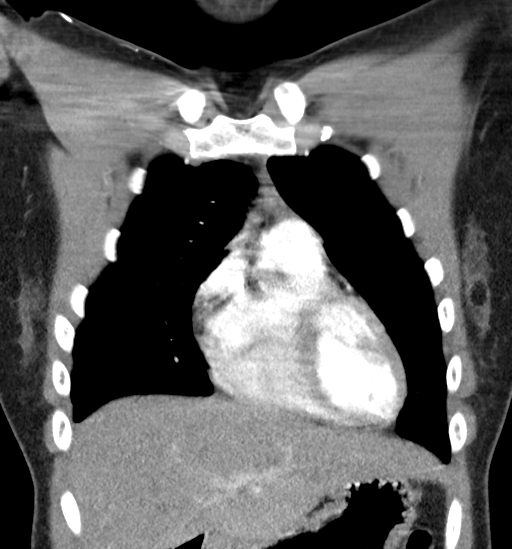
[im 33/65  soft-tissue]
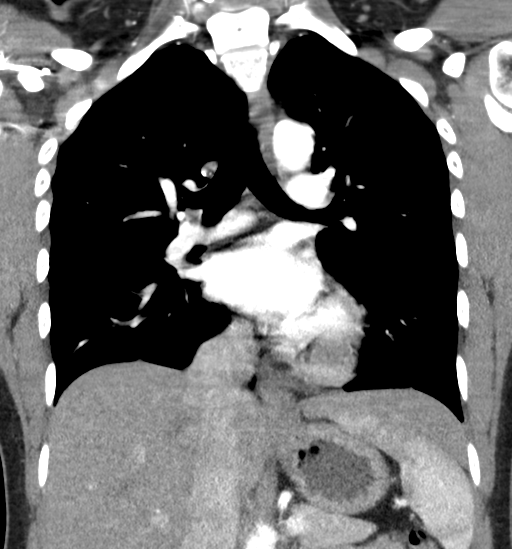
[im 49/65  soft-tissue]
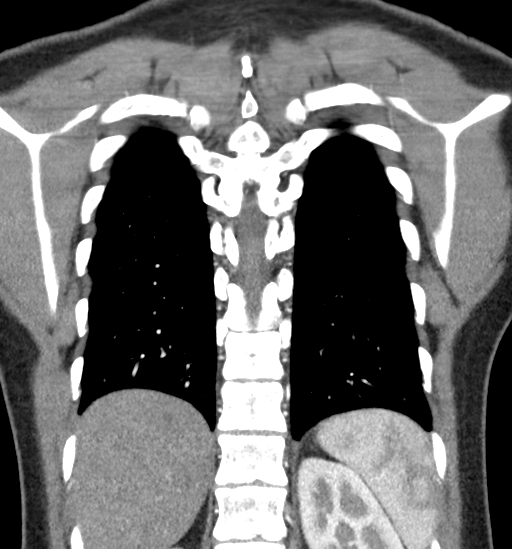

[19 of 46 positions shown; findings below may reference images not displayed]

FINDINGS: Cardiovascular: The thoracic aorta is normal with no aneurysm or
dissection. The heart size is normal. No coronary artery
calcifications identified. No pulmonary emboli.

Mediastinum/Nodes: The thyroid and esophagus are normal. No
adenopathy. No effusion. The chest wall is unremarkable.

Lungs/Pleura: Central airways are normal. No pneumothorax. There is
a nodule in the left lateral lung measuring 4 by 4 by 4 mm. No other
nodules. No masses or infiltrates.

Upper Abdomen: No acute abnormality.

Musculoskeletal: No chest wall abnormality. No acute or significant
osseous findings.

Review of the MIP images confirms the above findings.
IMPRESSION: 1. No pulmonary emboli identified.
2. No cause for the patient's chest pain is noted.
3. 4 mm nodule in the left lung base. No follow-up needed if patient
is low-risk. Non-contrast chest CT can be considered in 12 months if
patient is high-risk. This recommendation follows the consensus
statement: Guidelines for Management of Incidental Pulmonary Nodules
Detected on CT Images: From the [HOSPITAL] [K6]; Radiology

## 2018-09-24 MED ORDER — HYDROCODONE-ACETAMINOPHEN 5-325 MG PO TABS
1.0000 | ORAL_TABLET | Freq: Once | ORAL | Status: AC
  Administered 2018-09-24: 1 via ORAL
  Filled 2018-09-24: qty 1

## 2018-09-24 MED ORDER — NAPROXEN 500 MG PO TABS: 500.0000 mg | ORAL_TABLET | Freq: Two times a day (BID) | ORAL | 0 refills | Status: AC

## 2018-09-24 MED ORDER — METHYLPREDNISOLONE SODIUM SUCC 125 MG IJ SOLR
125.0000 mg | Freq: Once | INTRAMUSCULAR | Status: AC
  Administered 2018-09-24: 125 mg via INTRAVENOUS
  Filled 2018-09-24: qty 2

## 2018-09-24 MED ORDER — PREDNISONE 20 MG PO TABS: 40.0000 mg | ORAL_TABLET | Freq: Every day | ORAL | 0 refills | Status: AC

## 2018-09-24 MED ORDER — KETOROLAC TROMETHAMINE 30 MG/ML IJ SOLN
15.0000 mg | Freq: Once | INTRAMUSCULAR | Status: AC
  Administered 2018-09-24: 15 mg via INTRAVENOUS
  Filled 2018-09-24: qty 1

## 2018-09-24 MED ORDER — IPRATROPIUM-ALBUTEROL 0.5-2.5 (3) MG/3ML IN SOLN
3.0000 mL | Freq: Once | RESPIRATORY_TRACT | Status: AC
  Administered 2018-09-24: 3 mL via RESPIRATORY_TRACT
  Filled 2018-09-24: qty 3

## 2018-09-24 MED ORDER — IOHEXOL 350 MG/ML SOLN
75.0000 mL | Freq: Once | INTRAVENOUS | Status: AC | PRN
  Administered 2018-09-24: 75 mL via INTRAVENOUS

## 2018-09-24 MED ORDER — ALBUTEROL SULFATE HFA 108 (90 BASE) MCG/ACT IN AERS: 2.0000 | INHALATION_SPRAY | Freq: Four times a day (QID) | RESPIRATORY_TRACT | 2 refills | Status: DC | PRN

## 2018-09-24 NOTE — Discharge Instructions (Signed)
Return for worsening shortness of breath, cough, fevers or for any additional questions or concerns.

## 2018-09-24 NOTE — ED Notes (Signed)
Breathing treatment complete - pt states still feels the same.

## 2018-09-24 NOTE — ED Provider Notes (Signed)
[facility] Emergency Department Provider Note    None    (approximate)  I have reviewed the triage vital signs and the nursing notes.   HISTORY  Chief Complaint Shortness of Breath    HPI Adriana Harrell is a 27 y.o. female presents the ER for evaluation of shortness of breath and chest pain that hurts when she takes deep breath.  Never had pain like this before.  She does smoke and is on birth control.  Denies any leg swelling.  Pain is not worsened with exertion or palpation.  She does not vape.  No productive cough.  Denies any nausea or vomiting.    History reviewed. No pertinent past medical history. No family history on file. History reviewed. No pertinent surgical history. There are no active problems to display for this patient.     Prior to Admission medications   Medication Sig Start Date End Date Taking? Authorizing Provider  albuterol (PROVENTIL HFA;VENTOLIN HFA) 108 (90 Base) MCG/ACT inhaler Inhale 2 puffs into the lungs every 6 (six) hours as needed for wheezing or shortness of breath. 09/24/18   Willy Eddyobinson, Viriginia Amendola, MD  naproxen (NAPROSYN) 500 MG tablet Take 1 tablet (500 mg total) by mouth 2 (two) times daily with a meal. 09/24/18 09/24/19  Willy Eddyobinson, [place], MD  predniSONE (DELTASONE) 20 MG tablet Take 2 tablets (40 mg total) by mouth daily for 5 days. 09/24/18 09/29/18  Willy Eddyobinson, Alauna Hayden, MD    Allergies Patient has no allergy information on record.    Social History Social History   Tobacco Use  . Smoking status: Not on file  Substance Use Topics  . Alcohol use: Not on file  . Drug use: Not on file    Review of Systems Patient denies headaches, rhinorrhea, blurry vision, numbness, shortness of breath, chest pain, edema, cough, abdominal pain, nausea, vomiting, diarrhea, dysuria, fevers, rashes or hallucinations unless otherwise stated above in HPI. ____________________________________________   PHYSICAL EXAM:  VITAL  SIGNS: Vitals:   09/24/18 1800 09/24/18 1830  BP: (!) 114/94 (!) 115/100  Pulse: 90 94  Resp:    Temp:    SpO2: 98% 100%    Constitutional: Alert and oriented.  Eyes: Conjunctivae are normal.  Head: Atraumatic. Nose: No congestion/rhinnorhea. Mouth/Throat: Mucous membranes are moist.   Neck: No stridor. Painless ROM.  Cardiovascular: Normal rate, regular rhythm. Grossly normal heart sounds.  Good peripheral circulation. Respiratory: Normal respiratory effort.  No retractions. Lungs CTAB. Gastrointestinal: Soft and nontender. No distention. No abdominal bruits. No CVA tenderness. Genitourinary:  Musculoskeletal: No lower extremity tenderness nor edema.  No joint effusions. Neurologic:  Normal speech and language. No gross focal neurologic deficits are appreciated. No facial droop Skin:  Skin is warm, dry and intact. No rash noted. Psychiatric: Mood and affect are normal. Speech and behavior are normal.  ____________________________________________   LABS (all labs ordered are listed, but only abnormal results are displayed)  Results for orders placed or performed during the hospital encounter of 09/24/18 (from the past 24 hour(s))  Basic metabolic panel     Status: None   Collection Time: 09/24/18  2:55 PM  Result Value Ref Range   Sodium 138 135 - 145 mmol/L   Potassium 3.9 3.5 - 5.1 mmol/L   Chloride 105 98 - 111 mmol/L   CO2 24 22 - 32 mmol/L   Glucose, Bld 91 70 - 99 mg/dL   BUN 11 6 - 20 mg/dL   Creatinine, Ser 1.610.61 0.44 - 1.00  mg/dL   Calcium 9.4 8.9 - 16.1 mg/dL   GFR calc non Af Amer >60 >60 mL/min   GFR calc Af Amer >60 >60 mL/min   Anion gap 9 5 - 15  CBC     Status: Abnormal   Collection Time: 09/24/18  2:55 PM  Result Value Ref Range   WBC 10.9 (H) 4.0 - 10.5 K/uL   RBC 4.90 3.87 - 5.11 MIL/uL   Hemoglobin 14.1 12.0 - 15.0 g/dL   HCT 09.6 04.5 - 40.9 %   MCV 86.9 80.0 - 100.0 fL   MCH 28.8 26.0 - 34.0 pg   MCHC 33.1 30.0 - 36.0 g/dL   RDW 81.1 91.4 -  78.2 %   Platelets 267 150 - 400 K/uL   nRBC 0.0 0.0 - 0.2 %  Troponin I - ONCE - STAT     Status: None   Collection Time: 09/24/18  2:55 PM  Result Value Ref Range   Troponin I <0.03 <0.03 ng/mL  Fibrin derivatives D-Dimer ([facility] only)     Status: None   Collection Time: 09/24/18  3:25 PM  Result Value Ref Range   Fibrin derivatives D-dimer ([facility]) 262.16 0.00 - 499.00 ng/mL (FEU)  hCG, quantitative, pregnancy     Status: None   Collection Time: 09/24/18  4:43 PM  Result Value Ref Range   hCG, Beta Chain, Quant, S <1 <5 mIU/mL   ____________________________________________  EKG My review and personal interpretation at Time: 14:47    Indication: chest pain  Rate: 85  Rhythm: sinus Axis: normal Other: normal intervals, no stemi ____________________________________________  RADIOLOGY  I personally reviewed all radiographic images ordered to evaluate for the above acute complaints and reviewed radiology reports and findings.  These findings were personally discussed with the patient.  Please see medical record for radiology report.  ____________________________________________   PROCEDURES  Procedure(s) performed:  Procedures    Critical Care performed: no ____________________________________________   INITIAL IMPRESSION / ASSESSMENT AND PLAN / ED COURSE  Pertinent labs & imaging results that were available during my care of the patient were reviewed by me and considered in my medical decision making (see chart for details).   DDX: plueisy, bronchitis, pna, ptx, pe, acs  Adriana Harrell is a 27 y.o. who presents to the ED with symptoms as described above.  She arrives afebrile hemodynamically stable.  Initial triage pulse ox reported as 85 but the patient is resting comfortably satting 100% with a good waveform on room air.  She is low risk by Anner Crete but will further risk stratify with d-dimer for PE.  EKG shows no evidence of acute ischemia or dysrhythmia.  No evidence of  pneumothorax on chest x-ray.  Will give pain control as well as bronchodilators to see if she has some underlying bronchitis.  Clinical Course as of Sep 24 1933  Wynelle Link Sep 24, 2018  1626 Patient with significant sob and hypoxia with ambulation.  Quickly returned to normal with rest.  Do suspect some bronchospasm therefore will give steroids as well as nebulizer however given her no previous history of bronchitis, based on her symptoms will get a CT angiogram cannot better explain her degree of hypoxia and shortness of breath.   [PR]  1849 CTA shows no evidence of PE.  After nebulizer treatment as well as prednisone patient's ambulation trial as well without any hypoxia suggesting some response to bronchodilators.  Still complaining of some pain therefore will give dose of Toradol and reassess.   [  PR]  1916 Patient appears more comfortable.  Does have some scattered wheezes.  Do suspect component of bronchitis.   [PR]    Clinical Course User Index [PR] Willy Eddy, MD     As part of my medical decision making, I reviewed the following data within the electronic MEDICAL RECORD NUMBER Nursing notes reviewed and incorporated, Labs reviewed, notes from prior ED visits and [place] Controlled Substance Database   ____________________________________________   FINAL CLINICAL IMPRESSION(S) / ED DIAGNOSES  Final diagnoses:  SOB (shortness of breath)      NEW MEDICATIONS STARTED DURING THIS VISIT:  New Prescriptions   ALBUTEROL (PROVENTIL HFA;VENTOLIN HFA) 108 (90 BASE) MCG/ACT INHALER    Inhale 2 puffs into the lungs every 6 (six) hours as needed for wheezing or shortness of breath.   NAPROXEN (NAPROSYN) 500 MG TABLET    Take 1 tablet (500 mg total) by mouth 2 (two) times daily with a meal.   PREDNISONE (DELTASONE) 20 MG TABLET    Take 2 tablets (40 mg total) by mouth daily for 5 days.     Note:  This document was prepared using Dragon voice recognition software and may include unintentional  dictation errors.    Willy Eddy, MD 09/24/18 [phone]

## 2018-09-24 NOTE — ED Notes (Signed)
Pt ambulated from room 19 to c-pod nursing desk with sp02 95% hr 117.

## 2018-09-24 NOTE — ED Notes (Signed)
Applied 2L nasal cannula to patient at this time

## 2018-09-24 NOTE — ED Notes (Signed)
Reviewed discharge instructions, follow-up care, and prescriptions with patient. Patient verbalized understanding of all information reviewed. Patient stable, with no distress noted at this time.    

## 2018-09-24 NOTE — ED Triage Notes (Signed)
Pt comes via POV from home with c/o painful breathing and some SOB. Pt denies any recent cough or cold.  Pt states she went to urgent care and they advised her to come here for xray.

## 2018-09-24 NOTE — ED Notes (Signed)
Ambulated pt with pulse ox on - sp02 dropped to 85% walking down to c-pod desk and back. Dr made aware.

## 2018-09-24 NOTE — ED Notes (Signed)
Attempted IV draw able to get full rainbow but IV unable to save.

## 2019-09-14 ENCOUNTER — Encounter: Payer: Self-pay | Admitting: Emergency Medicine

## 2019-09-14 ENCOUNTER — Other Ambulatory Visit: Payer: Self-pay

## 2019-09-14 ENCOUNTER — Emergency Department
Admission: EM | Admit: 2019-09-14 | Discharge: 2019-09-14 | Disposition: A | Discharge status: Home / Self Care | Payer: BC Managed Care – PPO | Attending: Emergency Medicine | Admitting: Emergency Medicine

## 2019-09-14 ENCOUNTER — Emergency Department: Payer: BC Managed Care – PPO

## 2019-09-14 DIAGNOSIS — R002 Palpitations: Secondary | ICD-10-CM | POA: Insufficient documentation

## 2019-09-14 LAB — TROPONIN I (HIGH SENSITIVITY): Troponin I (High Sensitivity): <2 ng/L (ref <18)

## 2019-09-14 LAB — POCT PREGNANCY, URINE: Preg Test, Ur: NEGATIVE

## 2019-09-14 LAB — URINALYSIS, COMPLETE (UACMP) WITH MICROSCOPIC
Bacteria, UA: NONE SEEN
Bilirubin Urine: NEGATIVE
Glucose, UA: NEGATIVE mg/dL
Hgb urine dipstick: NEGATIVE
Ketones, ur: NEGATIVE mg/dL
Leukocytes,Ua: NEGATIVE
Nitrite: NEGATIVE
Protein, ur: NEGATIVE mg/dL
Specific Gravity, Urine: 1.013 (ref 1.005–1.030)
pH: 7 (ref 5.0–8.0)

## 2019-09-14 LAB — BASIC METABOLIC PANEL
Anion gap: 8 (ref 5–15)
BUN: 9 mg/dL (ref 6–20)
CO2: 23 mmol/L (ref 22–32)
Calcium: 9.2 mg/dL (ref 8.9–10.3)
Chloride: 108 mmol/L (ref 98–111)
Creatinine, Ser: 0.61 mg/dL (ref 0.44–1.00)
GFR calc Af Amer: >60 mL/min (ref >60)
GFR calc non Af Amer: >60 mL/min (ref >60)
Glucose, Bld: 99 mg/dL (ref 70–99)
Potassium: 4.3 mmol/L (ref 3.5–5.1)
Sodium: 139 mmol/L (ref 135–145)

## 2019-09-14 LAB — CBC
HCT: 40.9 % (ref 36.0–46.0)
Hemoglobin: 13.8 g/dL (ref 12.0–15.0)
MCH: 28.9 pg (ref 26.0–34.0)
MCHC: 33.7 g/dL (ref 30.0–36.0)
MCV: 85.7 fL (ref 80.0–100.0)
Platelets: 228 10*3/uL (ref 150–400)
RBC: 4.77 MIL/uL (ref 3.87–5.11)
RDW: 12.7 % (ref 11.5–15.5)
WBC: 6.5 10*3/uL (ref 4.0–10.5)
nRBC: 0 % (ref 0.0–0.2)

## 2019-09-14 IMAGING — DX DG CHEST 1V PORT
1 series · 1 of 1 positions shown · non-contrast
Comparison: [DATE]

CLINICAL DATA: Intermittent chest pain since [REDACTED]

EXAM:
PORTABLE CHEST 1 VIEW

[chest ap]
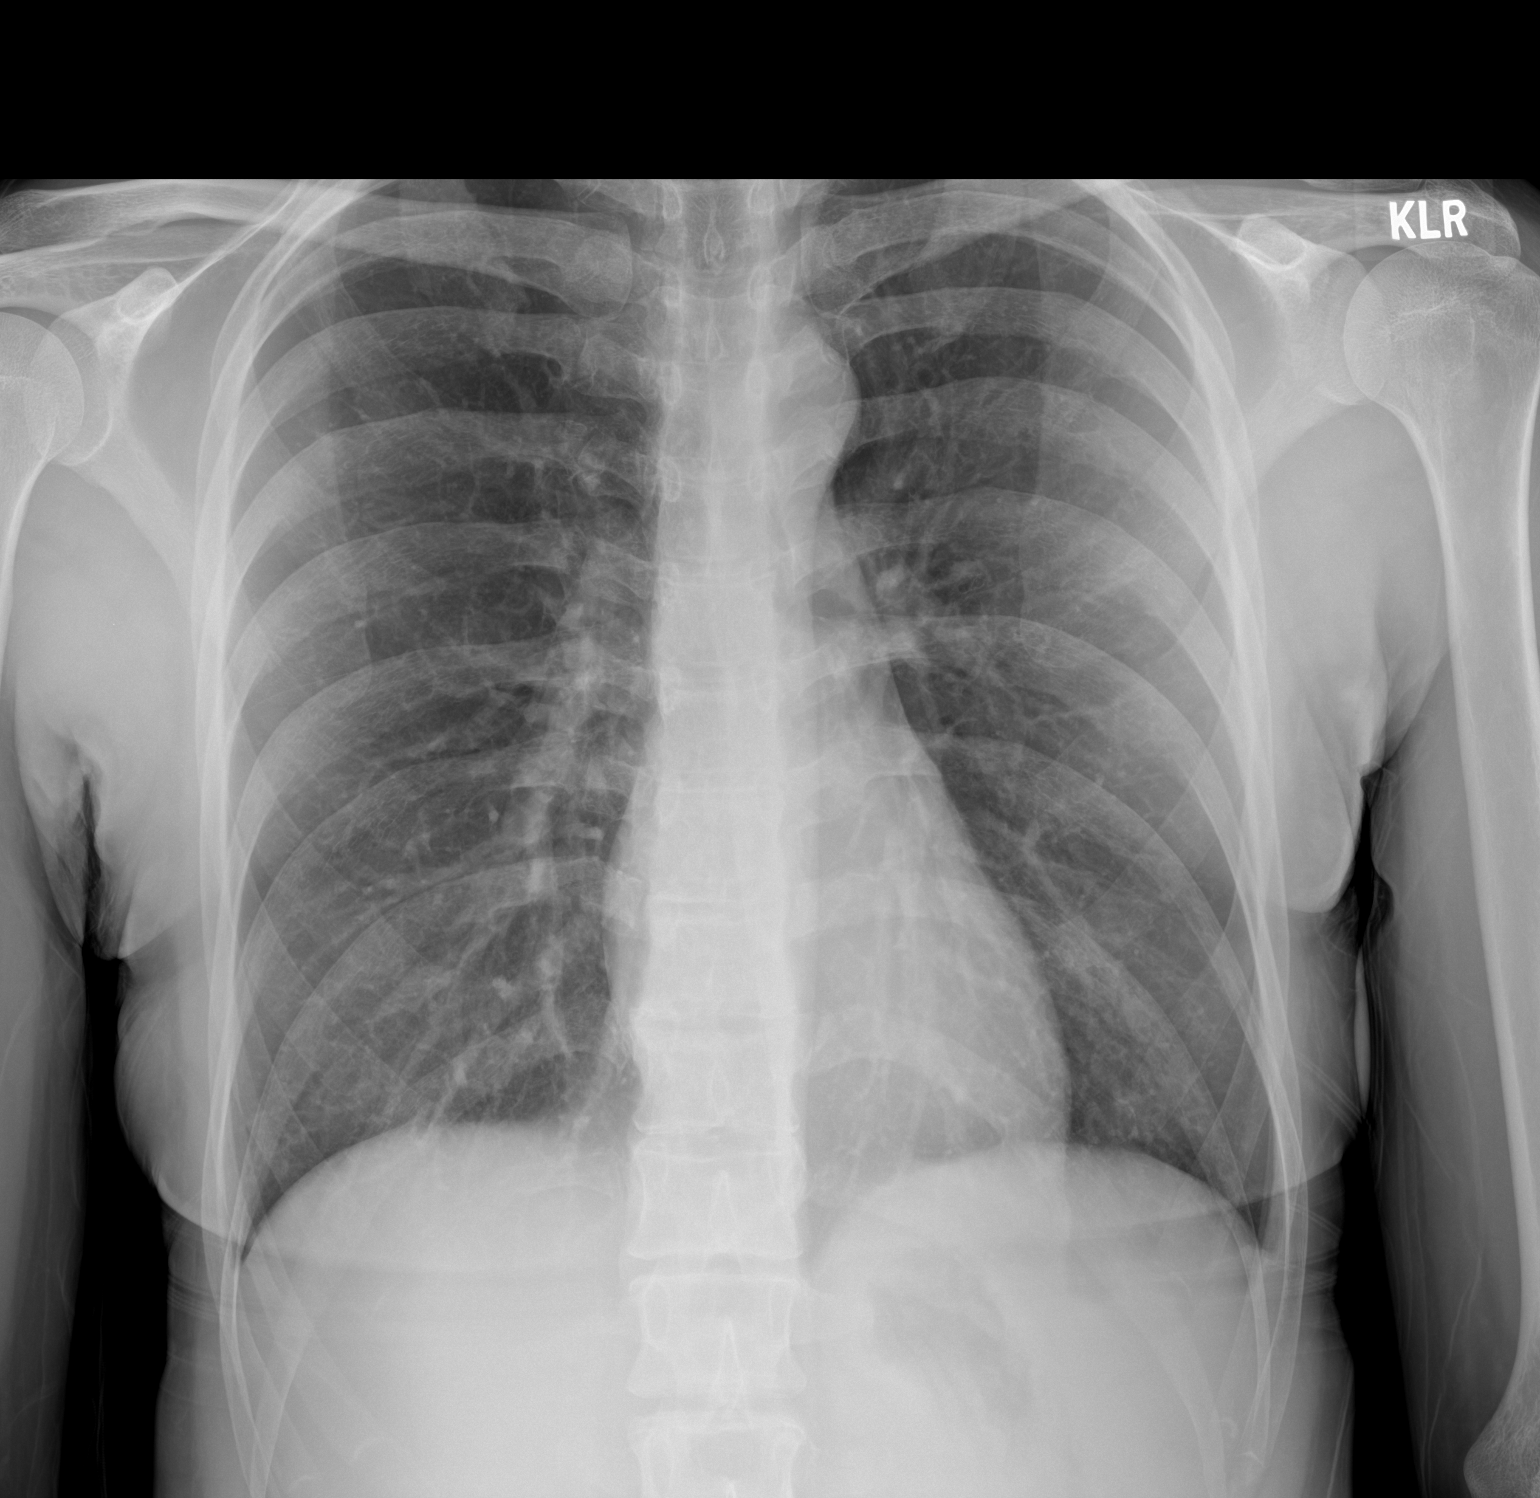

[1 of 1 positions shown; findings below may reference images not displayed]

FINDINGS: The heart size and mediastinal contours are within normal limits.
Both lungs are clear. The visualized skeletal structures are
unremarkable.
IMPRESSION: No active disease.

## 2019-09-14 MED ORDER — KETOROLAC TROMETHAMINE 30 MG/ML IJ SOLN
15.0000 mg | Freq: Once | INTRAMUSCULAR | Status: AC
  Administered 2019-09-14: 15 mg via INTRAVENOUS
  Filled 2019-09-14: qty 1

## 2019-09-14 MED ORDER — SODIUM CHLORIDE 0.9 % IV SOLN
1000.0000 mL | Freq: Once | INTRAVENOUS | Status: AC
  Administered 2019-09-14: 1000 mL via INTRAVENOUS

## 2019-09-14 NOTE — ED Triage Notes (Signed)
Pt reports intermittent episodes of HA, rapid heart rate and dizziness since Friday.

## 2019-09-14 NOTE — ED Provider Notes (Signed)
[facility] Emergency Department Provider Note   ____________________________________________    I have reviewed the triage vital signs and the nursing notes.   HISTORY  Chief Complaint Dizziness, headache    HPI Adriana Harrell is a 28 y.o. female who reports an episode of palpitations that occurred yesterday, she reports abruptly her heart was racing and she felt somewhat lightheaded.  She denies chest pain during that..  She notes it lasted about 20 minutes and then resolved, since then she has felt somewhat lightheaded but otherwise okay.  She reports this is happened once before.  No history of arrhythmia.  No history of thyroid issues.  No shortness of breath or pleurisy.  No nausea vomiting or chest pain.  Currently is feeling well except mild lightheadedness and headache  History reviewed. No pertinent past medical history.  There are no active problems to display for this patient.   History reviewed. No pertinent surgical history.  Prior to Admission medications   Medication Sig Start Date End Date Taking? Authorizing Provider  albuterol (PROVENTIL HFA;VENTOLIN HFA) 108 (90 Base) MCG/ACT inhaler Inhale 2 puffs into the lungs every 6 (six) hours as needed for wheezing or shortness of breath. 09/24/18   Willy Eddy, MD  naproxen (NAPROSYN) 500 MG tablet Take 1 tablet (500 mg total) by mouth 2 (two) times daily with a meal. 09/24/18 09/24/19  Willy Eddy, MD     Allergies Penicillins and Tamiflu [oseltamivir phosphate]  No family history on file.  Social History Does not smoke does not drink Review of Systems  Constitutional: No fever/chills Eyes: No visual changes.  ENT: No sore throat. Cardiovascular: Palpitations as above Respiratory: Denies shortness of breath. Gastrointestinal: No abdominal pain.   Genitourinary: Negative for dysuria. Musculoskeletal: Negative for back pain. Skin: Negative for rash. Neurological:  Negative for headaches    ____________________________________________   PHYSICAL EXAM:  VITAL SIGNS: ED Triage Vitals [09/14/19 0747]  Enc Vitals Group     BP      Pulse      Resp      Temp      Temp src      SpO2      Weight 53.5 kg (118 lb)     Height 1.6 m (5\' 3" )     Head Circumference      Peak Flow      Pain Score 4     Pain Loc      Pain Edu?      Excl. in GC?     Constitutional: Alert and oriented.   Nose: No congestion/rhinnorhea. Mouth/Throat: Mucous membranes are moist.    Cardiovascular: Normal rate, regular rhythm. Grossly normal heart sounds.  Good peripheral circulation. Respiratory: Normal respiratory effort.  No retractions. Lungs CTAB. Gastrointestinal: Soft and nontender. No distention.  .   Neurologic:  Normal speech and language. No gross focal neurologic deficits are appreciated.  Skin:  Skin is warm, dry and intact. No rash noted. Psychiatric: Mood and affect are normal. Speech and behavior are normal.  ____________________________________________   LABS (all labs ordered are listed, but only abnormal results are displayed)  Labs Reviewed  URINALYSIS, COMPLETE (UACMP) WITH MICROSCOPIC - Abnormal; Notable for the following components:      Result Value   Color, Urine YELLOW (*)    APPearance CLEAR (*)    All other components within normal limits  BASIC METABOLIC PANEL  CBC  POCT PREGNANCY, URINE  TROPONIN I (HIGH SENSITIVITY)  ____________________________________________  EKG  ED ECG REPORT I, Lavonia Drafts, the attending physician, personally viewed and interpreted this ECG.  Date: 09/14/2019  Rhythm: normal sinus rhythm QRS Axis: normal Intervals: normal ST/T Wave abnormalities: normal Narrative Interpretation: no evidence of acute ischemia  ____________________________________________  RADIOLOGY  Chest x-ray unremarkable ____________________________________________   PROCEDURES  Procedure(s) performed: No   Procedures   Critical Care performed: No ____________________________________________   INITIAL IMPRESSION / ASSESSMENT AND PLAN / ED COURSE  Pertinent labs & imaging results that were available during my care of the patient were reviewed by me and considered in my medical decision making (see chart for details).  Patient presents with palpitations as described above, EKG is quite reassuring, no PVCs, no delta wave.  However presentation is somewhat suspicious for SVT although she reports her heart rate was in the 130s which seems a little low for SVT.  Will treat with IV fluids, Toradol for headache and reevaluate.  Patient is feeling better after treatment, lab work is completely unremarkable.  Vital signs are unremarkable.  I will refer her to cardiology for further evaluation possible Holter monitoring     ____________________________________________   FINAL CLINICAL IMPRESSION(S) / ED DIAGNOSES  Final diagnoses:  Palpitations        Note:  This document was prepared using Dragon voice recognition software and may include unintentional dictation errors.   Lavonia Drafts, MD 09/14/19 1259

## 2019-10-31 ENCOUNTER — Ambulatory Visit
Admission: EM | Admit: 2019-10-31 | Discharge: 2019-10-31 | Disposition: A | Discharge status: Home / Self Care | Payer: BC Managed Care – PPO

## 2019-10-31 ENCOUNTER — Other Ambulatory Visit: Payer: Self-pay

## 2019-10-31 ENCOUNTER — Encounter: Payer: Self-pay | Admitting: Emergency Medicine

## 2019-10-31 DIAGNOSIS — R059 Cough, unspecified: Secondary | ICD-10-CM

## 2019-10-31 DIAGNOSIS — J029 Acute pharyngitis, unspecified: Secondary | ICD-10-CM | POA: Diagnosis not present

## 2019-10-31 DIAGNOSIS — J3489 Other specified disorders of nose and nasal sinuses: Secondary | ICD-10-CM | POA: Diagnosis not present

## 2019-10-31 DIAGNOSIS — R05 Cough: Secondary | ICD-10-CM

## 2019-10-31 DIAGNOSIS — B349 Viral infection, unspecified: Secondary | ICD-10-CM

## 2019-10-31 DIAGNOSIS — R5383 Other fatigue: Secondary | ICD-10-CM

## 2019-10-31 NOTE — Discharge Instructions (Addendum)
Your COVID test is pending.  You should self quarantine until your test result is back and is negative.   ° °Go to the emergency department if you develop high fever, shortness of breath, severe diarrhea, or other concerning symptoms.   ° °

## 2019-10-31 NOTE — ED Provider Notes (Signed)
Renaldo Fiddler    CSN: [id number] Arrival date & time: 10/31/19  1542      History   Chief Complaint Chief Complaint  Patient presents with  . Sore Throat  . Fatigue    HPI Adriana Harrell is a 28 y.o. female.   Patient presents with nonproductive cough, chills, body aches, fatigue, rhinorrhea, and sore throat since this morning.  She denies fever, rash, shortness of breath, vomiting, diarrhea, or other symptoms.  No treatments attempted at home.  Patient is a Engineer, civil (consulting) at [employer].   The history is provided by the patient.    History reviewed. No pertinent past medical history.  There are no problems to display for this patient.   History reviewed. No pertinent surgical history.  OB History   No obstetric history on file.      Home Medications    Prior to Admission medications   Medication Sig Start Date End Date Taking? Authorizing Provider  norethindrone (MICRONOR) 0.35 MG tablet Take by mouth. 10/06/18  Yes [provider]  albuterol (PROVENTIL HFA;VENTOLIN HFA) 108 (90 Base) MCG/ACT inhaler Inhale 2 puffs into the lungs every 6 (six) hours as needed for wheezing or shortness of breath. 09/24/18   Willy Eddy, MD    Family History History reviewed. No pertinent family history.  Social History Social History   Tobacco Use  . Smoking status: Current Some Day Smoker  . Smokeless tobacco: Never Used  Substance Use Topics  . Alcohol use: Not on file  . Drug use: Not on file     Allergies   Penicillins and Tamiflu [oseltamivir phosphate]   Review of Systems Review of Systems  Constitutional: Positive for chills and fatigue. Negative for fever.  HENT: Positive for rhinorrhea and sore throat. Negative for ear pain.   Eyes: Negative for pain and visual disturbance.  Respiratory: Positive for cough. Negative for shortness of breath.   Cardiovascular: Negative for chest pain and palpitations.  Gastrointestinal: Negative for abdominal  pain and vomiting.  Genitourinary: Negative for dysuria and hematuria.  Musculoskeletal: Negative for arthralgias and back pain.  Skin: Negative for color change and rash.  Neurological: Negative for seizures and syncope.  All other systems reviewed and are negative.    Physical Exam Triage Vital Signs ED Triage Vitals  Enc Vitals Group     BP      Pulse      Resp      Temp      Temp src      SpO2      Weight      Height      Head Circumference      Peak Flow      Pain Score      Pain Loc      Pain Edu?      Excl. in GC?    No data found.  Updated Vital Signs BP 119/74 (BP Location: Left Arm)   Pulse 82   Temp 98.6 F (37 C) (Oral)   Resp 16   Wt 115 lb (52.2 kg)   LMP 10/01/2019   SpO2 96%   BMI 20.37 kg/m   Visual Acuity Right Eye Distance:   Left Eye Distance:   Bilateral Distance:    Right Eye Near:   Left Eye Near:    Bilateral Near:     Physical Exam Vitals and nursing note reviewed.  Constitutional:      General: She is not in acute distress.  Appearance: She is well-developed.  HENT:     Head: Normocephalic and atraumatic.     Right Ear: Tympanic membrane normal.     Left Ear: Tympanic membrane normal.     Nose: Rhinorrhea present.     Mouth/Throat:     Mouth: Mucous membranes are moist.     Pharynx: Oropharynx is clear.  Eyes:     Conjunctiva/sclera: Conjunctivae normal.  Cardiovascular:     Rate and Rhythm: Normal rate and regular rhythm.     Heart sounds: No murmur.  Pulmonary:     Effort: Pulmonary effort is normal. No respiratory distress.     Breath sounds: Normal breath sounds.  Abdominal:     General: Bowel sounds are normal.     Palpations: Abdomen is soft.     Tenderness: There is no abdominal tenderness. There is no guarding or rebound.  Musculoskeletal:     Cervical back: Neck supple.  Skin:    General: Skin is warm and dry.     Findings: No rash.  Neurological:     General: No focal deficit present.     Mental  Status: She is alert and oriented to person, place, and time.  Psychiatric:        Mood and Affect: Mood normal.        Behavior: Behavior normal.      UC Treatments / Results  Labs (all labs ordered are listed, but only abnormal results are displayed) Labs Reviewed  NOVEL CORONAVIRUS, NAA    EKG   Radiology No results found.  Procedures Procedures (including critical care time)  Medications Ordered in UC Medications - No data to display  Initial Impression / Assessment and Plan / UC Course  I have reviewed the triage vital signs and the nursing notes.  Pertinent labs & imaging results that were available during my care of the patient were reviewed by me and considered in my medical decision making (see chart for details).    Viral Illness, cough.  COVID test performed here.  Instructed patient to self quarantine until the test result is back.  Instructed patient to go to the emergency department if develops high fever, shortness of breath, severe diarrhea, or other concerning symptoms.  Patient agrees with plan of care.   Final Clinical Impressions(s) / UC Diagnoses   Final diagnoses:  Cough  Viral illness     Discharge Instructions     Your COVID test is pending.  You should self quarantine until your test result is back and is negative.    Go to the emergency department if you develop high fever, shortness of breath, severe diarrhea, or other concerning symptoms.       ED Prescriptions    None     PDMP not reviewed this encounter.   Sharion Balloon, NP 10/31/19 [phone]

## 2019-10-31 NOTE — ED Triage Notes (Signed)
Patient in office today c/o sore throat and body ache woke up with these symptoms  CBI:PJRP  Denies: fever,loss of taste or smell

## 2019-11-01 LAB — NOVEL CORONAVIRUS, NAA: SARS-CoV-2, NAA: NOT DETECTED

## 2021-02-03 ENCOUNTER — Emergency Department: Payer: BC Managed Care – PPO

## 2021-02-03 ENCOUNTER — Other Ambulatory Visit: Payer: Self-pay

## 2021-02-03 ENCOUNTER — Emergency Department
Admission: EM | Admit: 2021-02-03 | Discharge: 2021-02-03 | Disposition: A | Discharge status: Home / Self Care | Payer: BC Managed Care – PPO | Attending: Emergency Medicine | Admitting: Emergency Medicine

## 2021-02-03 DIAGNOSIS — Z3A01 Less than 8 weeks gestation of pregnancy: Secondary | ICD-10-CM | POA: Insufficient documentation

## 2021-02-03 DIAGNOSIS — R1032 Left lower quadrant pain: Secondary | ICD-10-CM | POA: Diagnosis not present

## 2021-02-03 DIAGNOSIS — O26891 Other specified pregnancy related conditions, first trimester: Secondary | ICD-10-CM | POA: Insufficient documentation

## 2021-02-03 DIAGNOSIS — O26851 Spotting complicating pregnancy, first trimester: Secondary | ICD-10-CM | POA: Diagnosis not present

## 2021-02-03 DIAGNOSIS — O468X1 Other antepartum hemorrhage, first trimester: Secondary | ICD-10-CM

## 2021-02-03 DIAGNOSIS — Z87891 Personal history of nicotine dependence: Secondary | ICD-10-CM | POA: Diagnosis not present

## 2021-02-03 DIAGNOSIS — O418X10 Other specified disorders of amniotic fluid and membranes, first trimester, not applicable or unspecified: Secondary | ICD-10-CM

## 2021-02-03 LAB — BASIC METABOLIC PANEL
Anion gap: 7 (ref 5–15)
BUN: 9 mg/dL (ref 6–20)
CO2: 24 mmol/L (ref 22–32)
Calcium: 9.6 mg/dL (ref 8.9–10.3)
Chloride: 104 mmol/L (ref 98–111)
Creatinine, Ser: 0.52 mg/dL (ref 0.44–1.00)
GFR, Estimated: >60 mL/min (ref >60)
Glucose, Bld: 100 mg/dL — ABNORMAL HIGH (ref 70–99)
Potassium: 3.8 mmol/L (ref 3.5–5.1)
Sodium: 135 mmol/L (ref 135–145)

## 2021-02-03 LAB — CBC
HCT: 36.2 % (ref 36.0–46.0)
Hemoglobin: 12.5 g/dL (ref 12.0–15.0)
MCH: 29 pg (ref 26.0–34.0)
MCHC: 34.5 g/dL (ref 30.0–36.0)
MCV: 84 fL (ref 80.0–100.0)
Platelets: 232 10*3/uL (ref 150–400)
RBC: 4.31 MIL/uL (ref 3.87–5.11)
RDW: 13 % (ref 11.5–15.5)
WBC: 10.5 10*3/uL (ref 4.0–10.5)
nRBC: 0 % (ref 0.0–0.2)

## 2021-02-03 LAB — POC URINE PREG, ED: Preg Test, Ur: NEGATIVE

## 2021-02-03 LAB — HCG, QUANTITATIVE, PREGNANCY: hCG, Beta Chain, Quant, S: 69204 m[IU]/mL — ABNORMAL HIGH (ref <5)

## 2021-02-03 IMAGING — US US OB < 14 WEEKS - US OB TV
1 series · 13 of 28 positions shown · non-contrast
Comparison: None.

CLINICAL DATA: Initial evaluation for acute left lower quadrant
pain, vaginal spotting, early pregnancy.

EXAM:
OBSTETRIC <14 WK US AND TRANSVAGINAL OB US
TECHNIQUE: Both transabdominal and transvaginal ultrasound examinations were
performed for complete evaluation of the gestation as well as the
maternal uterus, adnexal regions, and pelvic cul-de-sac.
Transvaginal technique was performed to assess early pregnancy.

[Series 1: early ob us · arterial · 13 of 115 slices shown]
[im 5/115]
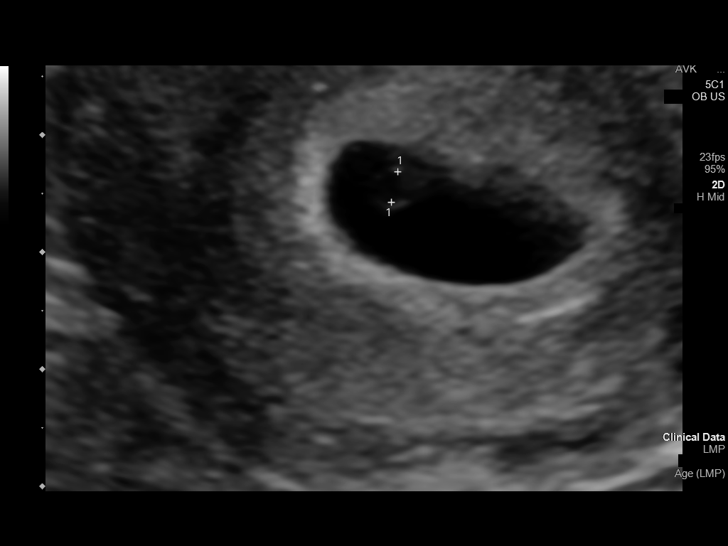
[im 13/115]
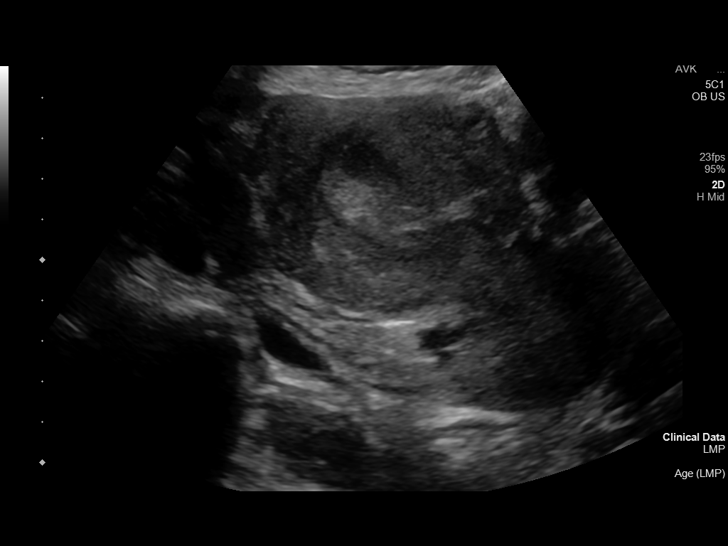
[im 22/115]
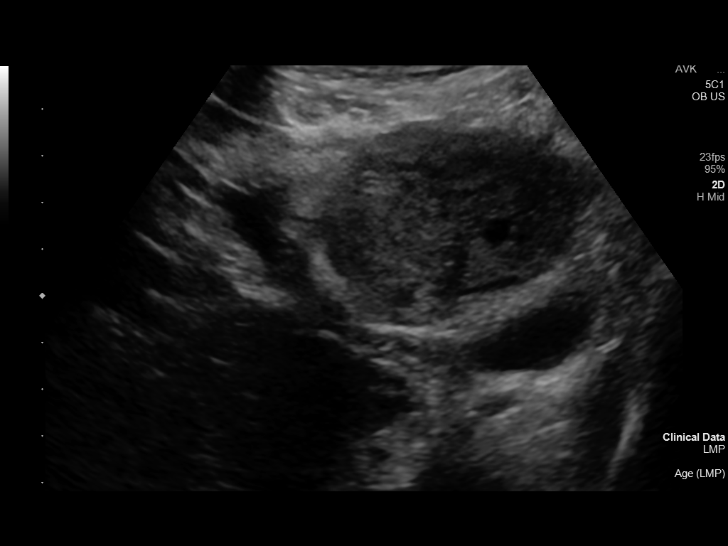
[im 30/115]
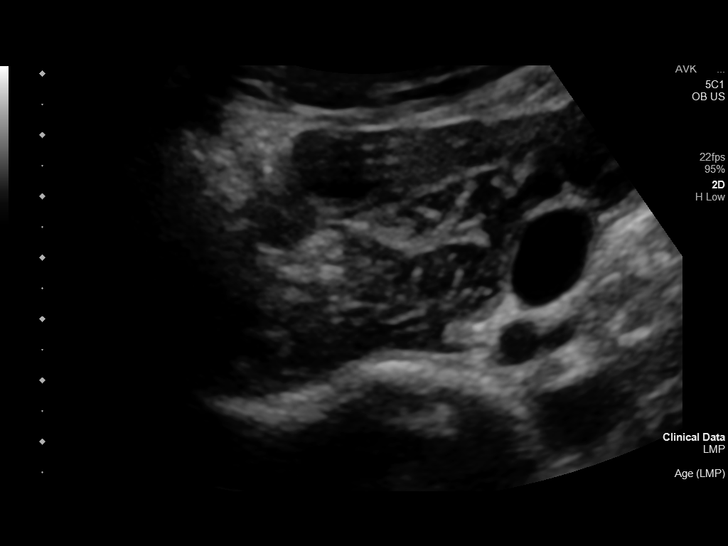
[im 39/115]
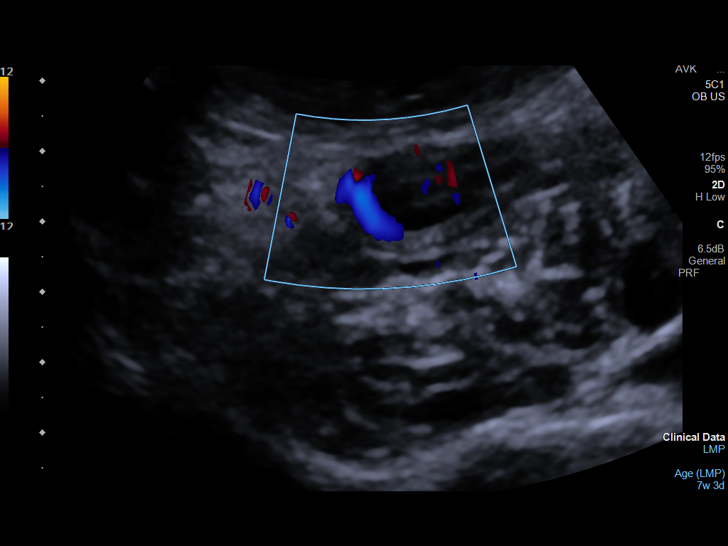
[im 47/115]
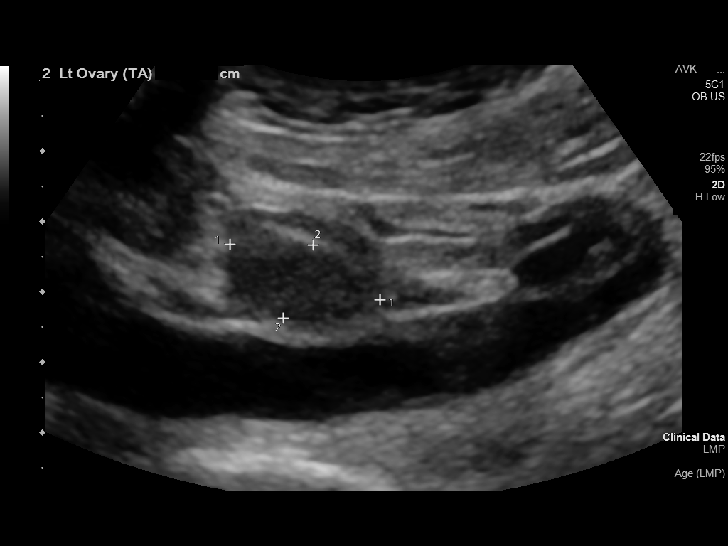
[im 60/115]
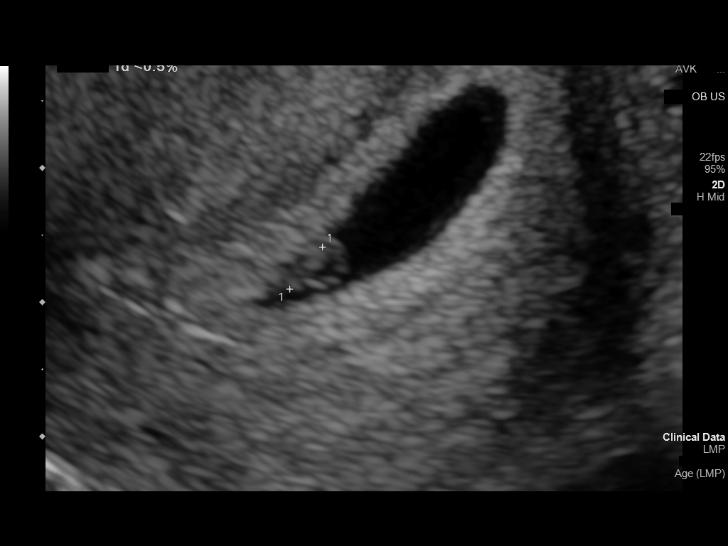
[im 68/115]
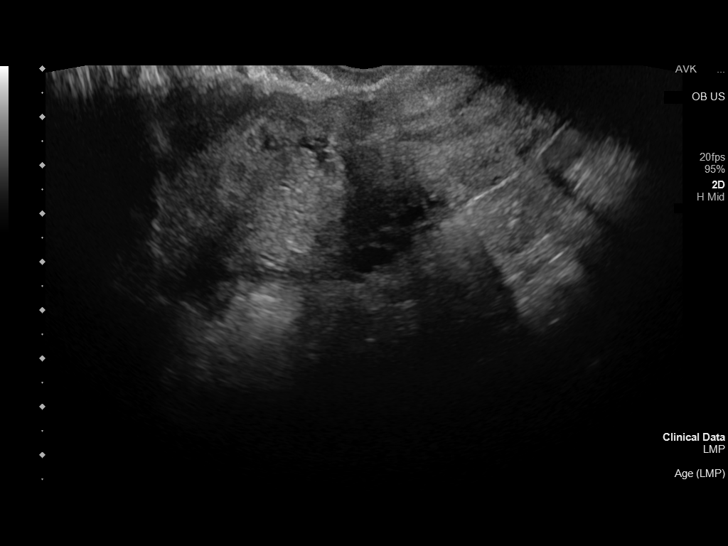
[im 77/115]
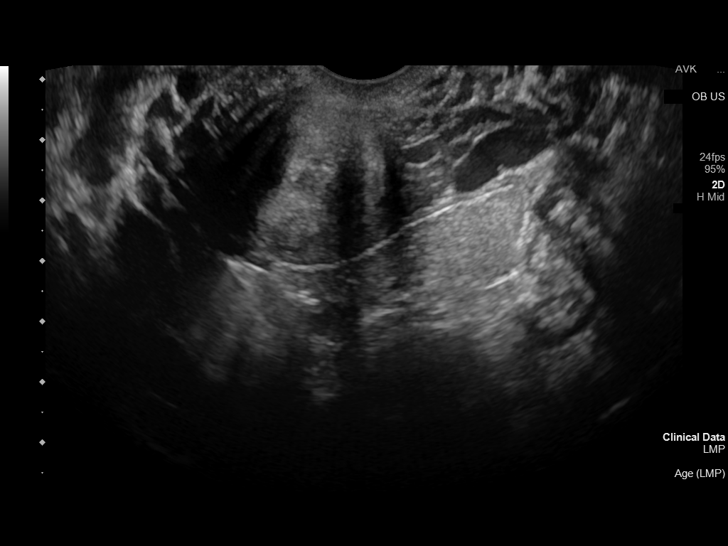
[im 85/115]
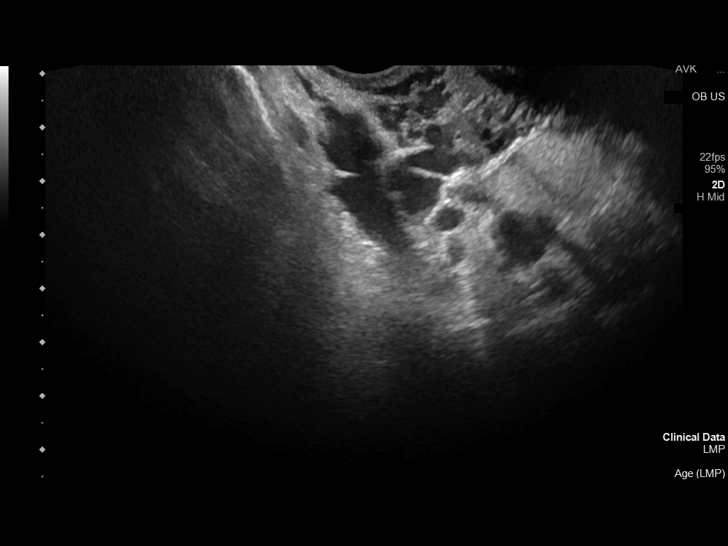
[im 93/115]
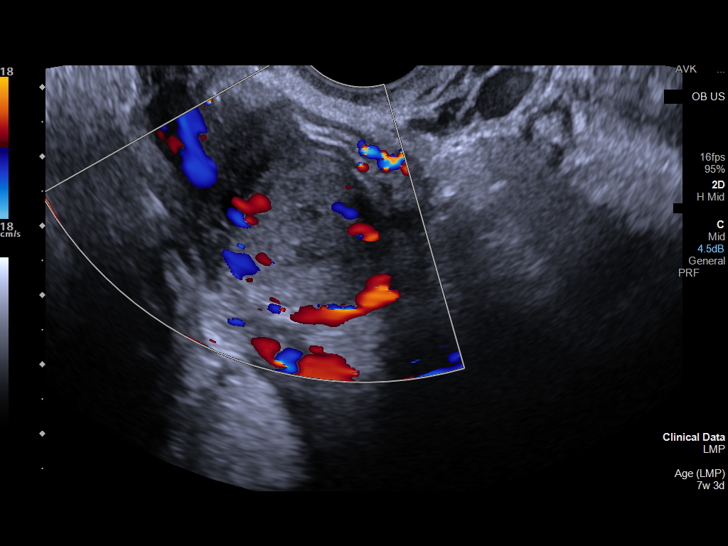
[im 102/115]
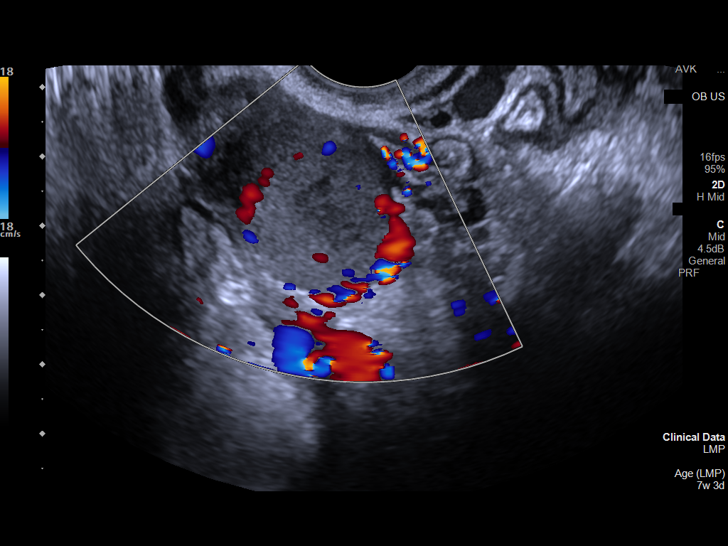
[im 110/115]
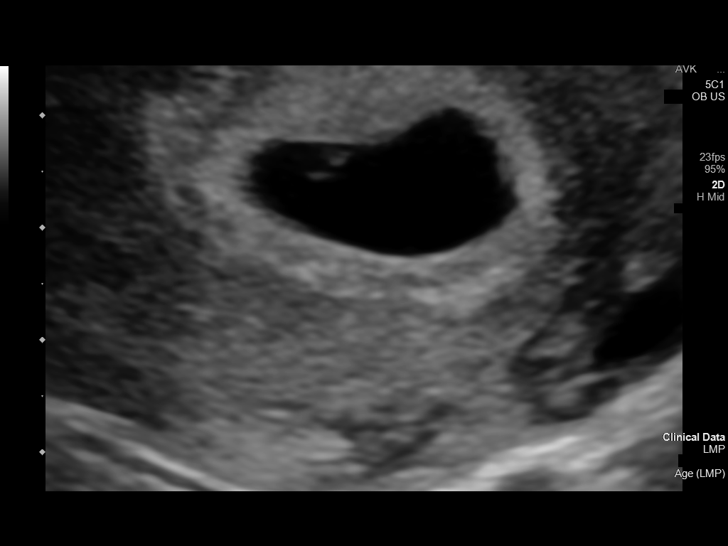

[13 of 28 positions shown; findings below may reference images not displayed]

FINDINGS: Intrauterine gestational sac: Single

Yolk sac:  Present

Embryo:  Present

Cardiac Activity: Present

Heart Rate: 114 bpm

CRL: 3.2 mm   6 w   0 d                  US EDC: [DATE]

Subchorionic hemorrhage: Small curvilinear subchorionic hemorrhage
without associated mass effect.

Maternal uterus/adnexae: Right ovary within normal limits. 2.7 x
x 2.1 cm degenerating corpus luteal cyst noted within the left
ovary. No adnexal mass or free fluid.
IMPRESSION: 1. Single viable intrauterine pregnancy as above, estimated
gestational age 6 weeks and 0 days by crown-rump length, with
ultrasound EDC of [DATE].
2. Small subchorionic hemorrhage without mass effect.
3. 2.7 cm left ovarian corpus luteal cyst, which could contribute to
left lower quadrant pain.

## 2021-02-03 NOTE — ED Provider Notes (Signed)
[facility] Emergency Department Provider Note  ____________________________________________   None    (approximate)  I have reviewed the triage vital signs and the nursing notes.   HISTORY  Chief Complaint Vaginal Bleeding    HPI Adriana Harrell is a 30 y.o. female presents emergency department complaint of left lower quadrant pain.  Patient states she is about [redacted] weeks pregnant.  She took 6 home pregnancy test that were all positive.  For the past 2 days she has had left lower quadrant pain combined with brown-tinged blood when she wipes.  She has not had an OB appointment yet.  She denies any fever or chills.  She denies vaginal discharge.  States that she does have a history of a miscarriage about 8 years ago.    History reviewed. No pertinent past medical history.  There are no problems to display for this patient.   Past Surgical History:  Procedure Laterality Date  . CESAREAN SECTION     x2  . TONSILLECTOMY      Prior to Admission medications   Medication Sig Start Date End Date Taking? Authorizing Provider  albuterol (PROVENTIL HFA;VENTOLIN HFA) 108 (90 Base) MCG/ACT inhaler Inhale 2 puffs into the lungs every 6 (six) hours as needed for wheezing or shortness of breath. 09/24/18   Willy Eddy, MD  norethindrone (MICRONOR) 0.35 MG tablet Take by mouth. 10/06/18   [provider]    Allergies Penicillins and Tamiflu [oseltamivir phosphate]  No family history on file.  Social History Social History   Tobacco Use  . Smoking status: Former Games developer  . Smokeless tobacco: Never Used  Substance Use Topics  . Alcohol use: Not Currently  . Drug use: Not Currently    Review of Systems  Constitutional: No fever/chills Eyes: No visual changes. ENT: No sore throat. Respiratory: Denies cough Cardiovascular: Denies chest pain Gastrointestinal: Positive left lower quadrant abdominal pain Genitourinary: Negative for dysuria.   Positive pelvic pain and bleeding and questionable pregnancy Musculoskeletal: Negative for back pain. Skin: Negative for rash. Psychiatric: no mood changes,     ____________________________________________   PHYSICAL EXAM:  VITAL SIGNS: ED Triage Vitals [02/03/21 1640]  Enc Vitals Group     BP (!) 109/48     Pulse Rate 85     Resp 16     Temp 98.4 F (36.9 C)     Temp Source Oral     SpO2 100 %     Weight 122 lb (55.3 kg)     Height 5\' 2"  (1.575 m)     Head Circumference      Peak Flow      Pain Score 6     Pain Loc      Pain Edu?      Excl. in GC?     Constitutional: Alert and oriented. Well appearing and in no acute distress. Eyes: Conjunctivae are normal.  Head: Atraumatic. Nose: No congestion/rhinnorhea. Mouth/Throat: Mucous membranes are moist.   Neck:  supple no lymphadenopathy noted Cardiovascular: Normal rate, regular rhythm. Heart sounds are normal Respiratory: Normal respiratory effort.  No retractions, lungs c t a  Abd: soft tender in the LLQ, bs normal all 4 quad GU: deferred Musculoskeletal: FROM all extremities, warm and well perfused Neurologic:  Normal speech and language.  Skin:  Skin is warm, dry and intact. No rash noted. Psychiatric: Mood and affect are normal. Speech and behavior are normal.  ____________________________________________   LABS (all labs ordered are listed, but  only abnormal results are displayed)  Labs Reviewed  BASIC METABOLIC PANEL - Abnormal; Notable for the following components:      Result Value   Glucose, Bld 100 (*)    All other components within normal limits  HCG, QUANTITATIVE, PREGNANCY - Abnormal; Notable for the following components:   hCG, Beta Chain, Quant, S 69,204 (*)    All other components within normal limits  CBC  POC URINE PREG, ED   ____________________________________________   ____________________________________________  RADIOLOGY  Ultrasound OB less than 14  weeks  ____________________________________________   PROCEDURES  Procedure(s) performed: No  Procedures    ____________________________________________   INITIAL IMPRESSION / ASSESSMENT AND PLAN / ED COURSE  Pertinent labs & imaging results that were available during my care of the patient were reviewed by me and considered in my medical decision making (see chart for details).   Patient is 30 year old female presents with left lower quadrant pain.  See HPI.  Physical exam shows patient per stable at the time  DDx: Round ligament pain, ectopic pregnancy, threatened miscarriage, ovarian cyst, tumor  CBC and metabolic panel are normal POC pregnancy is positive Beta hCG is pending  Ultrasound OB less than 14 weeks with transvaginal   Ultrasound reviewed by me confirmed by radiology to have a IUP, radiologist comments subchorionic hemorrhage and left luteal cyst  I did explain your thing to the patient.  She is to follow-up with her regular GYN doctor.  Return emergency department worsening.  Take over-the-counter prenatal pills.  She was discharged in stable condition  Adriana Harrell was evaluated in Emergency Department on 02/03/2021 for the symptoms described in the history of present illness. She was evaluated in the context of the global COVID-19 pandemic, which necessitated consideration that the patient might be at risk for infection with the SARS-CoV-2 virus that causes COVID-19. Institutional protocols and algorithms that pertain to the evaluation of patients at risk for COVID-19 are in a state of rapid change based on information released by regulatory bodies including the CDC and federal and state organizations. These policies and algorithms were followed during the patient's care in the ED.    As part of my medical decision making, I reviewed the following data within the electronic MEDICAL RECORD NUMBER Nursing notes reviewed and incorporated, Labs reviewed , Old chart  reviewed, Radiograph reviewed , Notes from prior ED visits and [place] Controlled Substance Database  ____________________________________________   FINAL CLINICAL IMPRESSION(S) / ED DIAGNOSES  Final diagnoses:  LLQ pain  Subchorionic hemorrhage of placenta in first trimester, single or unspecified fetus      NEW MEDICATIONS STARTED DURING THIS VISIT:  New Prescriptions   No medications on file     Note:  This document was prepared using Dragon voice recognition software and may include unintentional dictation errors.    Faythe Ghee, PA-C 02/03/21 1905    Gilles Chiquito, MD 02/03/21 [phone]

## 2021-02-03 NOTE — Discharge Instructions (Addendum)
Take over-the-counter prenatal pills. Return emergency department if worsening Follow-up with your regular doctor.  Please call for an appointment

## 2021-02-03 NOTE — ED Triage Notes (Signed)
Pt states she is about [redacted] weeks pregnant and for the past 2 days having brown tinged blood when she wipes and LLQ pain . States she has not had her first OB appt yet.Marland Kitchen

## 2021-02-03 NOTE — ED Notes (Signed)
Pt's POC is POSITIVE, negative result placed as error.

## 2021-02-17 LAB — OB RESULTS CONSOLE HEPATITIS B SURFACE ANTIGEN: Hepatitis B Surface Ag: NEGATIVE

## 2021-02-17 LAB — OB RESULTS CONSOLE RUBELLA ANTIBODY, IGM: Rubella: IMMUNE

## 2021-03-06 ENCOUNTER — Ambulatory Visit
Admission: RE | Admit: 2021-03-06 | Discharge: 2021-03-06 | Disposition: A | Discharge status: Home / Self Care | Payer: BC Managed Care – PPO | Source: Ambulatory Visit | Attending: Emergency Medicine | Admitting: Emergency Medicine

## 2021-03-06 ENCOUNTER — Other Ambulatory Visit: Payer: Self-pay

## 2021-03-06 VITALS — BP 104/67 | HR 76 | Temp 98.1°F | Resp 16 | Ht 62.0 in | Wt 126.0 lb

## 2021-03-06 DIAGNOSIS — Z1152 Encounter for screening for COVID-19: Secondary | ICD-10-CM

## 2021-03-06 DIAGNOSIS — Z3A10 10 weeks gestation of pregnancy: Secondary | ICD-10-CM | POA: Diagnosis not present

## 2021-03-06 DIAGNOSIS — J302 Other seasonal allergic rhinitis: Secondary | ICD-10-CM | POA: Diagnosis not present

## 2021-03-06 NOTE — ED Triage Notes (Signed)
Pt reports having productive cough, sore throat an bila ear discomfort x2 weeks. sts her ears feels like they are full of fluid.  Neg home covid test yesterday.

## 2021-03-06 NOTE — ED Provider Notes (Signed)
Renaldo Fiddler    CSN: [id number] Arrival date & time: 03/06/21  0944      History   Chief Complaint Chief Complaint  Patient presents with  . Cough    HPI Adriana Harrell is a 30 y.o. female.   Patient presents with 2-week history of ear fullness, sore throat, postnasal drip, runny nose, cough.  She denies fever, chills, rash, shortness of breath, abdominal pain, nausea, vomiting, or other symptoms.  Treatment attempted at home Tylenol cold and flu medication.  Patient is [redacted] weeks pregnant.  She denies other pertinent medical history.  The history is provided by the patient.    History reviewed. No pertinent past medical history.  There are no problems to display for this patient.   Past Surgical History:  Procedure Laterality Date  . CESAREAN SECTION     x2  . TONSILLECTOMY      OB History    Gravida  1   Para      Term      Preterm      AB      Living        SAB      IAB      Ectopic      Multiple      Live Births               Home Medications    Prior to Admission medications   Medication Sig Start Date End Date Taking? Authorizing Provider  albuterol (PROVENTIL HFA;VENTOLIN HFA) 108 (90 Base) MCG/ACT inhaler Inhale 2 puffs into the lungs every 6 (six) hours as needed for wheezing or shortness of breath. 09/24/18   Willy Eddy, MD  norethindrone (MICRONOR) 0.35 MG tablet Take by mouth. 10/06/18   [provider]    Family History No family history on file.  Social History Social History   Tobacco Use  . Smoking status: Former Games developer  . Smokeless tobacco: Never Used  Substance Use Topics  . Alcohol use: Not Currently  . Drug use: Not Currently     Allergies   Penicillins and Tamiflu [oseltamivir phosphate]   Review of Systems Review of Systems  Constitutional: Negative for chills and fever.  HENT: Positive for ear pain, postnasal drip, rhinorrhea and sore throat.   Eyes: Negative for pain and visual  disturbance.  Respiratory: Positive for cough. Negative for shortness of breath.   Cardiovascular: Negative for chest pain and palpitations.  Gastrointestinal: Negative for abdominal pain, diarrhea and vomiting.  Genitourinary: Negative for dysuria and hematuria.  Skin: Negative for color change and rash.  All other systems reviewed and are negative.    Physical Exam Triage Vital Signs ED Triage Vitals  Enc Vitals Group     BP      Pulse      Resp      Temp      Temp src      SpO2      Weight      Height      Head Circumference      Peak Flow      Pain Score      Pain Loc      Pain Edu?      Excl. in GC?    No data found.  Updated Vital Signs BP 104/67   Pulse 76   Temp 98.1 F (36.7 C) (Oral)   Resp 16   Ht 5\' 2"  (1.575 m)   Wt 126 lb (  57.2 kg)   LMP 12/11/2020 (Exact Date)   SpO2 99%   BMI 23.05 kg/m   Visual Acuity Right Eye Distance:   Left Eye Distance:   Bilateral Distance:    Right Eye Near:   Left Eye Near:    Bilateral Near:     Physical Exam Vitals and nursing note reviewed.  Constitutional:      General: She is not in acute distress.    Appearance: She is well-developed. She is not ill-appearing.  HENT:     Head: Normocephalic and atraumatic.     Right Ear: Tympanic membrane and ear canal normal.     Left Ear: Tympanic membrane and ear canal normal.     Nose: Nose normal.     Mouth/Throat:     Mouth: Mucous membranes are moist.     Pharynx: Oropharynx is clear.     Comments: Clear postnasal drip. Eyes:     Conjunctiva/sclera: Conjunctivae normal.  Cardiovascular:     Rate and Rhythm: Normal rate and regular rhythm.     Heart sounds: Normal heart sounds.  Pulmonary:     Effort: Pulmonary effort is normal. No respiratory distress.     Breath sounds: Normal breath sounds.  Abdominal:     Palpations: Abdomen is soft.     Tenderness: There is no abdominal tenderness.  Musculoskeletal:     Cervical back: Neck supple.  Skin:     General: Skin is warm and dry.     Findings: No rash.  Neurological:     General: No focal deficit present.     Mental Status: She is alert and oriented to person, place, and time.     Gait: Gait normal.  Psychiatric:        Mood and Affect: Mood normal.        Behavior: Behavior normal.      UC Treatments / Results  Labs (all labs ordered are listed, but only abnormal results are displayed) Labs Reviewed  NOVEL CORONAVIRUS, NAA    EKG   Radiology No results found.  Procedures Procedures (including critical care time)  Medications Ordered in UC Medications - No data to display  Initial Impression / Assessment and Plan / UC Course  I have reviewed the triage vital signs and the nursing notes.  Pertinent labs & imaging results that were available during my care of the patient were reviewed by me and considered in my medical decision making (see chart for details).   Seasonal allergies, [redacted] weeks pregnant, COVID test.  Patient is well-appearing and her exam is reassuring.  PCR COVID pending.  Instructed patient to self quarantine until test result is back.  Instructed her to follow-up with her OB/GYN and only take medications as directed by her OB/GYN.  ED precautions discussed.  Patient agrees to plan of care.   Final Clinical Impressions(s) / UC Diagnoses   Final diagnoses:  Seasonal allergies  [redacted] weeks gestation of pregnancy  Encounter for screening for COVID-19     Discharge Instructions     Your COVID test is pending.  You should self quarantine until the test result is back.    Take over-the-counter symptomatic medications as directed by your OB/GYN.  Follow-up with your primary care provider or OB/GYN if your symptoms are not improving.        ED Prescriptions    None     PDMP not reviewed this encounter.   Mickie Bail, NP 03/06/21 1019

## 2021-03-06 NOTE — Discharge Instructions (Signed)
Your COVID test is pending.  You should self quarantine until the test result is back.    Take over-the-counter symptomatic medications as directed by your OB/GYN.  Follow-up with your primary care provider or OB/GYN if your symptoms are not improving.

## 2021-03-07 LAB — NOVEL CORONAVIRUS, NAA: SARS-CoV-2, NAA: NOT DETECTED

## 2021-03-07 LAB — SARS-COV-2, NAA 2 DAY TAT

## 2021-05-19 ENCOUNTER — Other Ambulatory Visit: Payer: Self-pay

## 2021-05-19 ENCOUNTER — Inpatient Hospital Stay (HOSPITAL_COMMUNITY): Payer: BC Managed Care – PPO

## 2021-05-19 ENCOUNTER — Inpatient Hospital Stay (HOSPITAL_COMMUNITY)
Admission: AD | Admit: 2021-05-19 | Discharge: 2021-05-19 | Disposition: A | Discharge status: Home / Self Care | Payer: BC Managed Care – PPO | Attending: Obstetrics and Gynecology | Admitting: Obstetrics and Gynecology

## 2021-05-19 ENCOUNTER — Encounter (HOSPITAL_COMMUNITY): Payer: Self-pay

## 2021-05-19 DIAGNOSIS — Z3A21 21 weeks gestation of pregnancy: Secondary | ICD-10-CM | POA: Insufficient documentation

## 2021-05-19 DIAGNOSIS — R109 Unspecified abdominal pain: Secondary | ICD-10-CM

## 2021-05-19 DIAGNOSIS — R1011 Right upper quadrant pain: Secondary | ICD-10-CM | POA: Insufficient documentation

## 2021-05-19 DIAGNOSIS — O26892 Other specified pregnancy related conditions, second trimester: Secondary | ICD-10-CM | POA: Insufficient documentation

## 2021-05-19 HISTORY — DX: Migraine, unspecified, not intractable, without status migrainosus: G43.909

## 2021-05-19 LAB — CBC WITH DIFFERENTIAL/PLATELET
Abs Immature Granulocytes: 0.1 10*3/uL — ABNORMAL HIGH (ref 0.00–0.07)
Basophils Absolute: 0 10*3/uL (ref 0.0–0.1)
Basophils Relative: 0 %
Eosinophils Absolute: 0.2 10*3/uL (ref 0.0–0.5)
Eosinophils Relative: 2 %
HCT: 34.5 % — ABNORMAL LOW (ref 36.0–46.0)
Hemoglobin: 11.8 g/dL — ABNORMAL LOW (ref 12.0–15.0)
Immature Granulocytes: 1 %
Lymphocytes Relative: 22 %
Lymphs Abs: 2.4 10*3/uL (ref 0.7–4.0)
MCH: 29.8 pg (ref 26.0–34.0)
MCHC: 34.2 g/dL (ref 30.0–36.0)
MCV: 87.1 fL (ref 80.0–100.0)
Monocytes Absolute: 0.8 10*3/uL (ref 0.1–1.0)
Monocytes Relative: 8 %
Neutro Abs: 7.6 10*3/uL (ref 1.7–7.7)
Neutrophils Relative %: 67 %
Platelets: 248 10*3/uL (ref 150–400)
RBC: 3.96 MIL/uL (ref 3.87–5.11)
RDW: 13.2 % (ref 11.5–15.5)
WBC: 11.1 10*3/uL — ABNORMAL HIGH (ref 4.0–10.5)
nRBC: 0 % (ref 0.0–0.2)

## 2021-05-19 LAB — URINALYSIS, ROUTINE W REFLEX MICROSCOPIC
Bilirubin Urine: NEGATIVE
Glucose, UA: NEGATIVE mg/dL
Hgb urine dipstick: NEGATIVE
Ketones, ur: NEGATIVE mg/dL
Leukocytes,Ua: NEGATIVE
Nitrite: NEGATIVE
Protein, ur: NEGATIVE mg/dL
Specific Gravity, Urine: 1.016 (ref 1.005–1.030)
pH: 7 (ref 5.0–8.0)

## 2021-05-19 LAB — COMPREHENSIVE METABOLIC PANEL
ALT: 12 U/L (ref 0–44)
AST: 20 U/L (ref 15–41)
Albumin: 2.9 g/dL — ABNORMAL LOW (ref 3.5–5.0)
Alkaline Phosphatase: 86 U/L (ref 38–126)
Anion gap: 5 (ref 5–15)
BUN: 5 mg/dL — ABNORMAL LOW (ref 6–20)
CO2: 24 mmol/L (ref 22–32)
Calcium: 8.6 mg/dL — ABNORMAL LOW (ref 8.9–10.3)
Chloride: 105 mmol/L (ref 98–111)
Creatinine, Ser: 0.5 mg/dL (ref 0.44–1.00)
GFR, Estimated: >60 mL/min (ref >60)
Glucose, Bld: 74 mg/dL (ref 70–99)
Potassium: 3.7 mmol/L (ref 3.5–5.1)
Sodium: 134 mmol/L — ABNORMAL LOW (ref 135–145)
Total Bilirubin: 0.2 mg/dL — ABNORMAL LOW (ref 0.3–1.2)
Total Protein: 6.2 g/dL — ABNORMAL LOW (ref 6.5–8.1)

## 2021-05-19 LAB — LIPASE, BLOOD: Lipase: 32 U/L (ref 11–51)

## 2021-05-19 IMAGING — US US ABDOMEN LIMITED
1 series · 15 of 25 positions shown · non-contrast
Comparison: None.

CLINICAL DATA: Right upper quadrant pain pregnant patient

EXAM:
ULTRASOUND ABDOMEN LIMITED RIGHT UPPER QUADRANT

[Series 1: us abdomen limited · 15 of 50 slices shown]
[im 1/50]
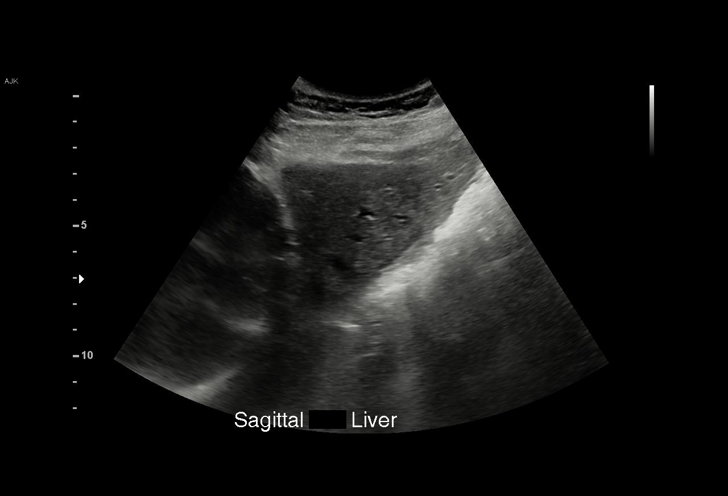
[im 5/50]
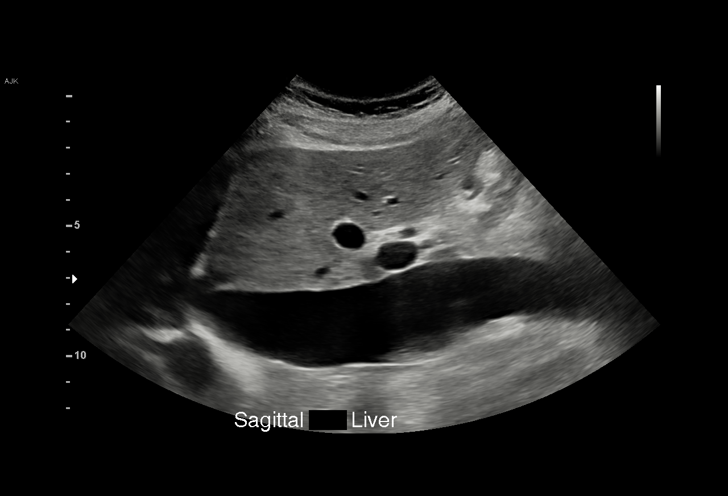
[im 9/50]
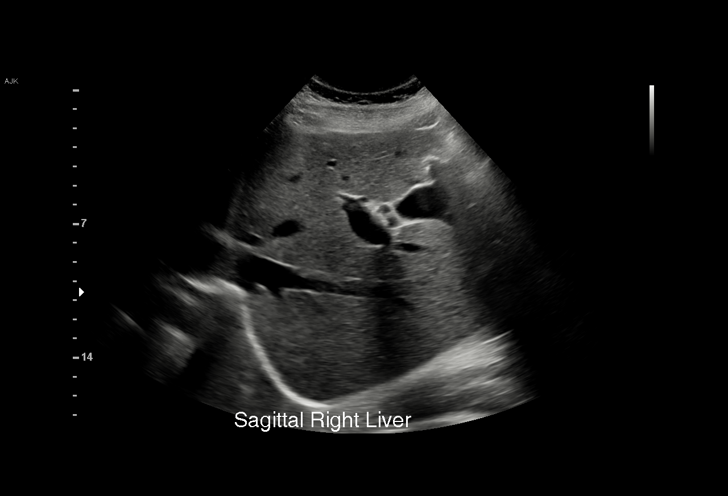
[im 11/50]
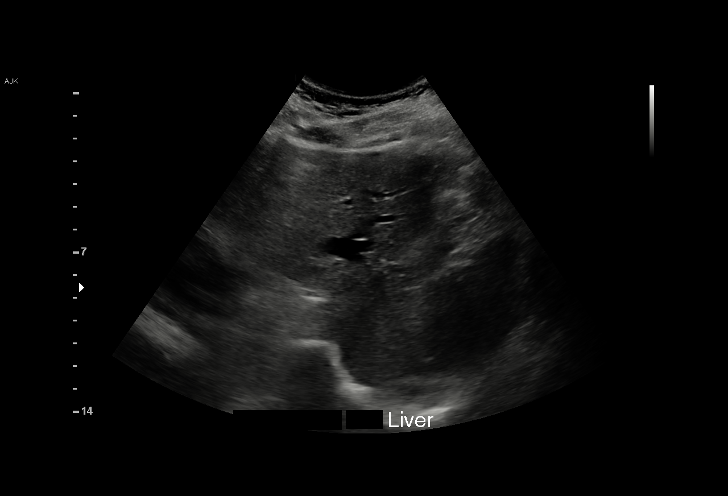
[im 15/50]
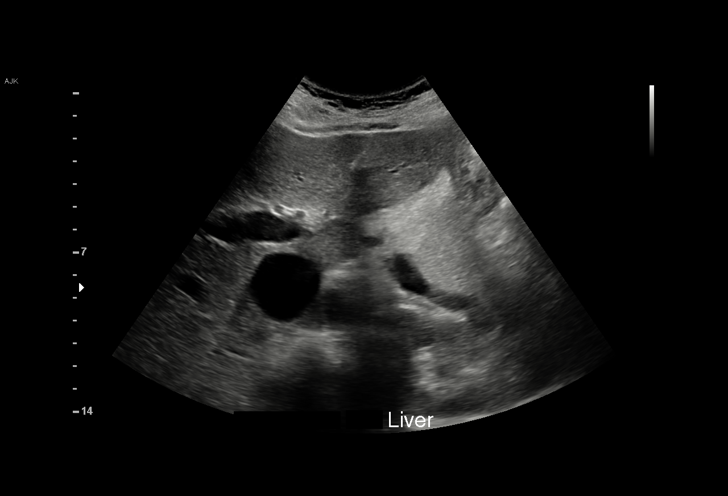
[im 19/50]
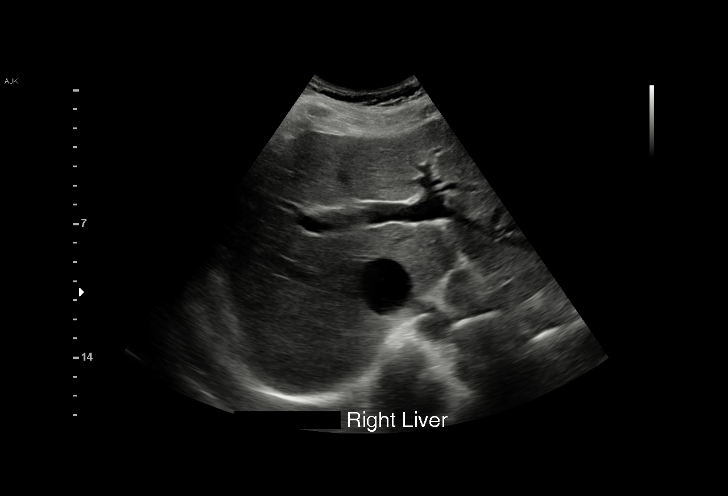
[im 21/50]
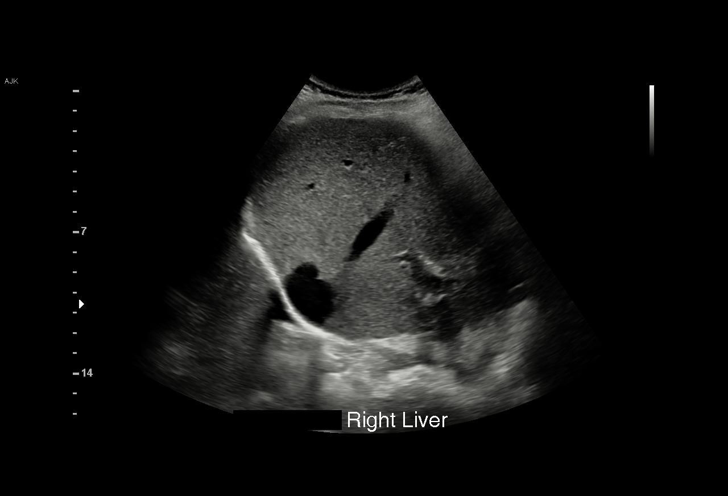
[im 25/50]
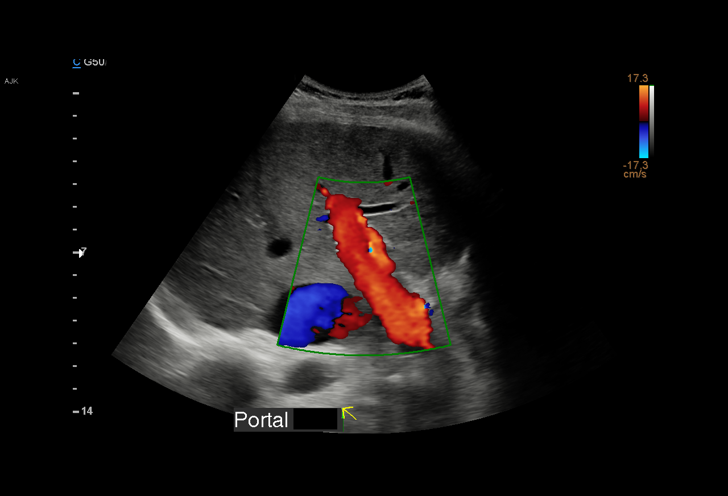
[im 29/50]
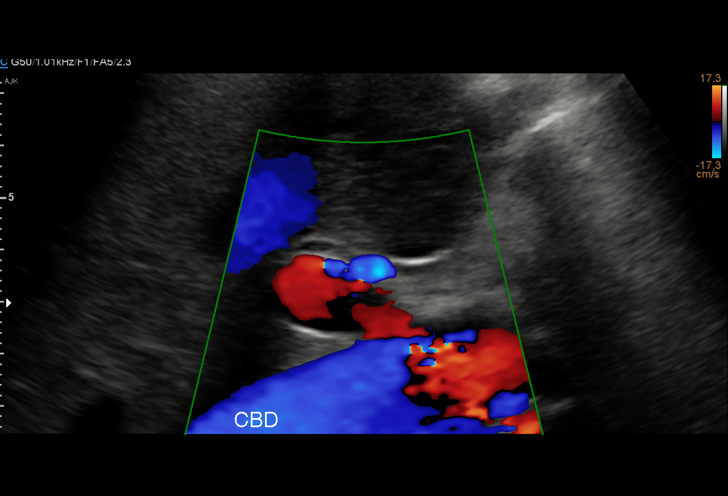
[im 31/50]
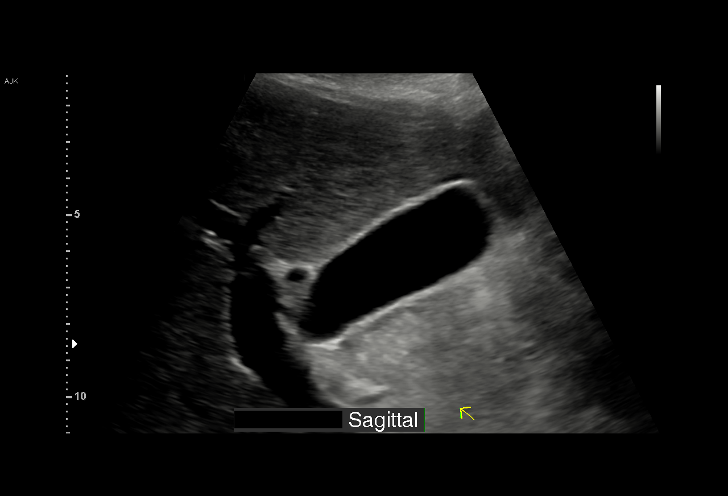
[im 35/50]
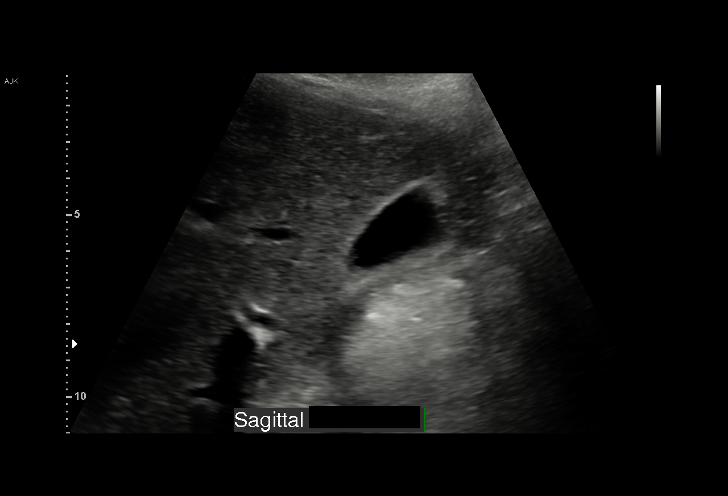
[im 39/50]
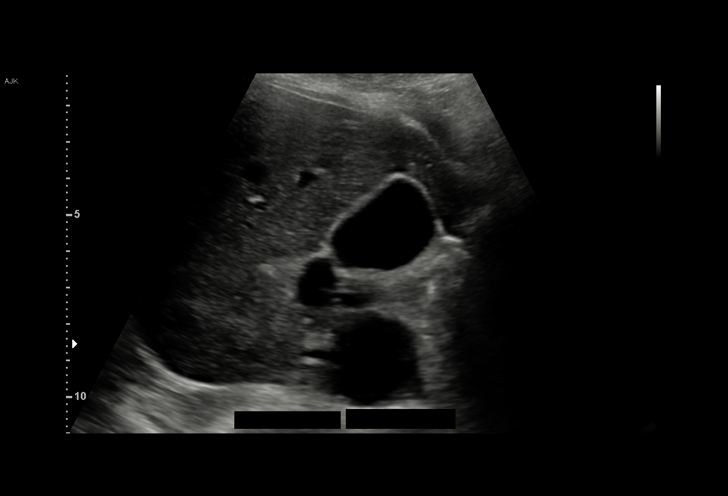
[im 41/50]
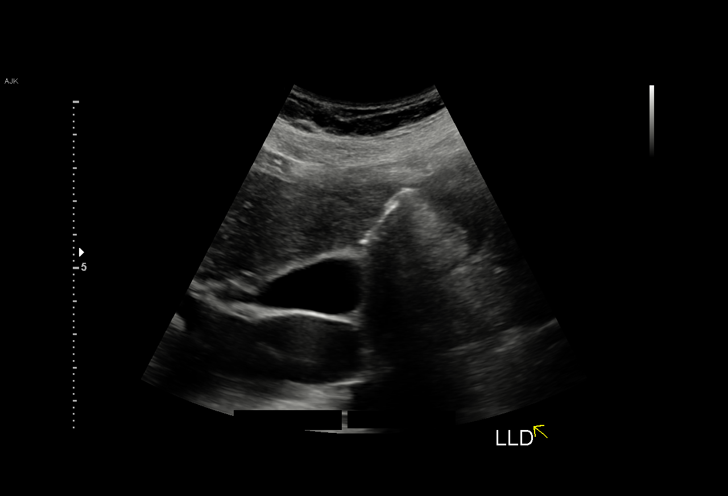
[im 45/50]
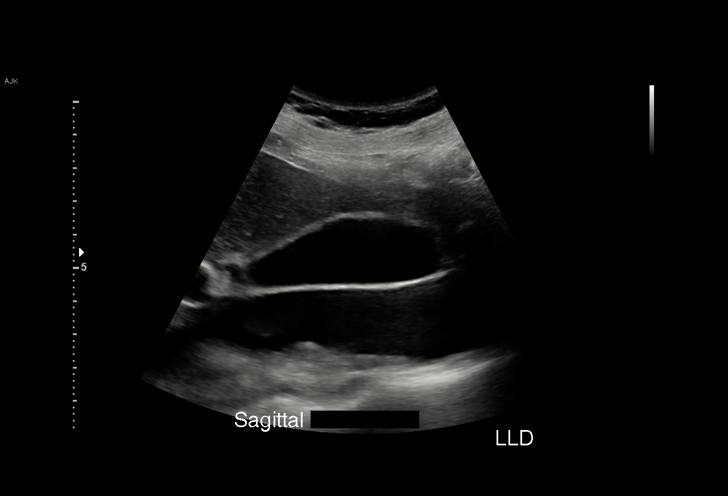
[im 50/50]
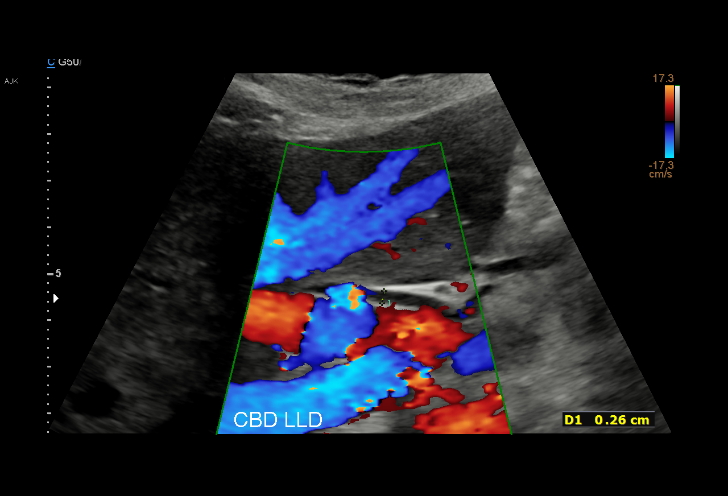

[15 of 25 positions shown; findings below may reference images not displayed]

FINDINGS: Gallbladder:

No gallstones or wall thickening visualized. No sonographic Murphy
sign noted by sonographer.

Common bile duct:

Diameter: 2.6 mm

Liver:

No focal lesion identified. Within normal limits in parenchymal
echogenicity. Portal vein is patent on color Doppler imaging with
normal direction of blood flow towards the liver.

Other: None.
IMPRESSION: Negative examination

## 2021-05-19 MED ORDER — HYDROMORPHONE HCL 1 MG/ML IJ SOLN
0.5000 mg | Freq: Once | INTRAMUSCULAR | Status: AC
  Administered 2021-05-19: 0.5 mg via INTRAVENOUS
  Filled 2021-05-19: qty 1

## 2021-05-19 MED ORDER — LACTATED RINGERS IV BOLUS
1000.0000 mL | Freq: Once | INTRAVENOUS | Status: AC
  Administered 2021-05-19: 1000 mL via INTRAVENOUS

## 2021-05-19 MED ORDER — SODIUM CHLORIDE 0.9 % IV SOLN
25.0000 mg | Freq: Once | INTRAVENOUS | Status: AC
  Administered 2021-05-19: 25 mg via INTRAVENOUS
  Filled 2021-05-19: qty 1

## 2021-05-19 MED ORDER — TRAMADOL HCL 50 MG PO TABS: 50.0000 mg | ORAL_TABLET | Freq: Four times a day (QID) | ORAL | 0 refills | Status: DC | PRN

## 2021-05-19 MED ORDER — SIMETHICONE 80 MG PO CHEW: 80.0000 mg | CHEWABLE_TABLET | Freq: Four times a day (QID) | ORAL | 0 refills | Status: DC | PRN

## 2021-05-19 MED ORDER — HYDROMORPHONE HCL 1 MG/ML IJ SOLN
1.0000 mg | Freq: Once | INTRAMUSCULAR | Status: AC
  Administered 2021-05-19: 1 mg via INTRAVENOUS
  Filled 2021-05-19: qty 1

## 2021-05-19 MED ORDER — SIMETHICONE 80 MG PO CHEW
80.0000 mg | CHEWABLE_TABLET | Freq: Once | ORAL | Status: AC
  Administered 2021-05-19: 80 mg via ORAL
  Filled 2021-05-19: qty 1

## 2021-05-19 NOTE — MAU Provider Note (Addendum)
History     CSN: [id number]  Arrival date and time: 05/19/21 1730   Event Date/Time   First Provider Initiated Contact with Patient 05/19/21 1801      Chief Complaint  Patient presents with   Abdominal Pain   HPI Adriana Harrell is a 30 y.o. [id number] at [redacted]w[redacted]d who presents with abdominal pain. Symptoms started this morning. Reports constant aching pain in her right mid to upper abdomen. Rates pain 8/10. Took 2 extra strength tylenol earlier with mild relief. Symptoms worse with eating. Ate scrambled eggs this morning and an apple later in the day. Endorses nausea & vomited a few times today. Denies contractions, fever/chills, dysuria, diarrhea, vaginal bleeding, or LOF. Positive fetal movement.   OB History     Gravida  4   Para  2   Term  2   Preterm      AB  1   Living  2      SAB  1   IAB      Ectopic      Multiple      Live Births  2           Past Medical History:  Diagnosis Date   Migraine     Past Surgical History:  Procedure Laterality Date   CESAREAN SECTION     x2   TONSILLECTOMY      History reviewed. No pertinent family history.  Social History   Tobacco Use   Smoking status: Former   Smokeless tobacco: Never  Substance Use Topics   Alcohol use: Not Currently   Drug use: Not Currently    Allergies:  Allergies  Allergen Reactions   Penicillins Hives   Tamiflu [Oseltamivir Phosphate] Hives    Medications Prior to Admission  Medication Sig Dispense Refill Last Dose   acetaminophen (TYLENOL) 500 MG tablet Take 1,000 mg by mouth every 6 (six) hours as needed.   05/19/2021 at 1430   Prenatal Vit-Fe Fumarate-FA (PRENATAL MULTIVITAMIN) TABS tablet Take 1 tablet by mouth daily at 12 noon.   05/18/2021   albuterol (PROVENTIL HFA;VENTOLIN HFA) 108 (90 Base) MCG/ACT inhaler Inhale 2 puffs into the lungs every 6 (six) hours as needed for wheezing or shortness of breath. 1 Inhaler 2     Review of Systems  Constitutional: Negative.    Gastrointestinal:  Positive for abdominal pain, nausea and vomiting. Negative for constipation and diarrhea.  Genitourinary: Negative.   Neurological: Negative.   Physical Exam   Blood pressure 110/64, pulse 76, temperature 98 F (36.7 C), temperature source Oral, resp. rate 16, last menstrual period 12/11/2020, SpO2 100 %.  Physical Exam Vitals and nursing note reviewed.  Constitutional:      General: She is not in acute distress.    Appearance: She is well-developed and normal weight. She is not ill-appearing.  HENT:     Head: Normocephalic and atraumatic.  Pulmonary:     Effort: Pulmonary effort is normal. No respiratory distress.  Abdominal:     General: Bowel sounds are normal.     Palpations: Abdomen is soft.     Tenderness: There is abdominal tenderness in the right upper quadrant. There is no right CVA tenderness, left CVA tenderness, guarding or rebound. Negative signs include Murphy's sign and McBurney's sign.  Skin:    General: Skin is warm and dry.  Neurological:     Mental Status: She is alert.  Psychiatric:        Mood and Affect: Mood normal.  Behavior: Behavior normal.    MAU Course  Procedures Results for orders placed or performed during the hospital encounter of 05/19/21 (from the past 24 hour(s))  Urinalysis, Routine w reflex microscopic Urine, Clean Catch     Status: Abnormal   Collection Time: 05/19/21  5:58 PM  Result Value Ref Range   Color, Urine YELLOW YELLOW   APPearance HAZY (A) CLEAR   Specific Gravity, Urine 1.016 1.005 - 1.030   pH 7.0 5.0 - 8.0   Glucose, UA NEGATIVE NEGATIVE mg/dL   Hgb urine dipstick NEGATIVE NEGATIVE   Bilirubin Urine NEGATIVE NEGATIVE   Ketones, ur NEGATIVE NEGATIVE mg/dL   Protein, ur NEGATIVE NEGATIVE mg/dL   Nitrite NEGATIVE NEGATIVE   Leukocytes,Ua NEGATIVE NEGATIVE    MDM FHT present via doppler.   Patient present with RUQ pain. Vital signs are normal. Has some tenderness in RUQ but otherwise benign  abdominal exam.  IV fluids, phenergan, & dilaudid given for pain management & nausea.   CBC, CMP, lipase, & RUQ ultrasound ordered.   Care turned over to [facility] CNM Judeth Horn, NP 05/19/2021 7:58 PM    Results for orders placed or performed during the hospital encounter of 05/19/21 (from the past 24 hour(s))  Urinalysis, Routine w reflex microscopic Urine, Clean Catch     Status: Abnormal   Collection Time: 05/19/21  5:58 PM  Result Value Ref Range   Color, Urine YELLOW YELLOW   APPearance HAZY (A) CLEAR   Specific Gravity, Urine 1.016 1.005 - 1.030   pH 7.0 5.0 - 8.0   Glucose, UA NEGATIVE NEGATIVE mg/dL   Hgb urine dipstick NEGATIVE NEGATIVE   Bilirubin Urine NEGATIVE NEGATIVE   Ketones, ur NEGATIVE NEGATIVE mg/dL   Protein, ur NEGATIVE NEGATIVE mg/dL   Nitrite NEGATIVE NEGATIVE   Leukocytes,Ua NEGATIVE NEGATIVE  CBC with Differential/Platelet     Status: Abnormal   Collection Time: 05/19/21  8:47 PM  Result Value Ref Range   WBC 11.1 (H) 4.0 - 10.5 K/uL   RBC 3.96 3.87 - 5.11 MIL/uL   Hemoglobin 11.8 (L) 12.0 - 15.0 g/dL   HCT 03.0 (L) 09.2 - 33.0 %   MCV 87.1 80.0 - 100.0 fL   MCH 29.8 26.0 - 34.0 pg   MCHC 34.2 30.0 - 36.0 g/dL   RDW 07.6 22.6 - 33.3 %   Platelets 248 150 - 400 K/uL   nRBC 0.0 0.0 - 0.2 %   Neutrophils Relative % 67 %   Neutro Abs 7.6 1.7 - 7.7 K/uL   Lymphocytes Relative 22 %   Lymphs Abs 2.4 0.7 - 4.0 K/uL   Monocytes Relative 8 %   Monocytes Absolute 0.8 0.1 - 1.0 K/uL   Eosinophils Relative 2 %   Eosinophils Absolute 0.2 0.0 - 0.5 K/uL   Basophils Relative 0 %   Basophils Absolute 0.0 0.0 - 0.1 K/uL   Immature Granulocytes 1 %   Abs Immature Granulocytes 0.10 (H) 0.00 - 0.07 K/uL  Comprehensive metabolic panel     Status: Abnormal   Collection Time: 05/19/21  8:47 PM  Result Value Ref Range   Sodium 134 (L) 135 - 145 mmol/L   Potassium 3.7 3.5 - 5.1 mmol/L   Chloride 105 98 - 111 mmol/L   CO2 24 22 - 32 mmol/L    Glucose, Bld 74 70 - 99 mg/dL   BUN 5 (L) 6 - 20 mg/dL   Creatinine, Ser 5.45 0.44 - 1.00 mg/dL   Calcium 8.6 (  L) 8.9 - 10.3 mg/dL   Total Protein 6.2 (L) 6.5 - 8.1 g/dL   Albumin 2.9 (L) 3.5 - 5.0 g/dL   AST 20 15 - 41 U/L   ALT 12 0 - 44 U/L   Alkaline Phosphatase 86 38 - 126 U/L   Total Bilirubin 0.2 (L) 0.3 - 1.2 mg/dL   GFR, Estimated >78 >29 mL/min   Anion gap 5 5 - 15  Lipase, blood     Status: None   Collection Time: 05/19/21  8:47 PM  Result Value Ref Range   Lipase 32 11 - 51 U/L    US ABDOMEN LIMITED RUQ (LIVER/GB)  Result Date: 05/19/2021 CLINICAL DATA:  Right upper quadrant pain pregnant patient EXAM: ULTRASOUND ABDOMEN LIMITED RIGHT UPPER QUADRANT COMPARISON:  None. FINDINGS: Gallbladder: No gallstones or wall thickening visualized. No sonographic Murphy sign noted by sonographer. Common bile duct: Diameter: 2.6 mm Liver: No focal lesion identified. Within normal limits in parenchymal echogenicity. Portal vein is patent on color Doppler imaging with normal direction of blood flow towards the liver. Other: None. IMPRESSION: Negative examination Electronically Signed   By: Jasmine Pang M.D.   On: 05/19/2021 19:55     MDM:   Pt with normal RUQ ultrasound and normal liver enzymes and lipase.  No elevated WBCs.  No rebound tenderness or guarding, no acute abdomen.  Dilaudid second dose given along with simethicone.  Pt symptoms improved.  D/C home with Rx for simethicone and Tramadol 10 tabs only.  Note to miss work x 2-3 days.  Return precautions reviewed.  Assessment and Plan   1. Abdominal pain during pregnancy in second trimester   2. RUQ pain   3. [redacted] weeks gestation of pregnancy      D/C home with return precautions   Sharen Counter, CNM 3:23 AM

## 2021-05-19 NOTE — MAU Note (Signed)
...  Adriana Harrell is a 30 y.o. at [redacted]w[redacted]d here in MAU reporting: Right upper/mid abdominal pain that began this morning around 0500. She states her pain increased when eating eggs this morning and later on an apple around 1230. She rates her pain 7/10 and states it feels sharp and stabbing at times but for the most part it feels "dull and naggy." Last took 1000 mg of Tylenol at 1430. +FM. No VB or LOF.   Vitals:   05/19/21 1750  BP: 121/64  Pulse: 78  Resp: 16  Temp: 98 F (36.7 C)  SpO2: 99%     FHT: 153 doppler Lab orders placed from triage: UA

## 2021-06-10 ENCOUNTER — Inpatient Hospital Stay (HOSPITAL_COMMUNITY): Payer: BC Managed Care – PPO

## 2021-06-10 ENCOUNTER — Inpatient Hospital Stay (HOSPITAL_COMMUNITY)
Admission: AD | Admit: 2021-06-10 | Discharge: 2021-06-10 | Disposition: A | Discharge status: Home / Self Care | Payer: BC Managed Care – PPO | Attending: Obstetrics and Gynecology | Admitting: Obstetrics and Gynecology

## 2021-06-10 ENCOUNTER — Other Ambulatory Visit: Payer: Self-pay

## 2021-06-10 ENCOUNTER — Inpatient Hospital Stay (HOSPITAL_BASED_OUTPATIENT_CLINIC_OR_DEPARTMENT_OTHER): Payer: BC Managed Care – PPO

## 2021-06-10 ENCOUNTER — Encounter (HOSPITAL_COMMUNITY): Payer: Self-pay | Admitting: Obstetrics and Gynecology

## 2021-06-10 DIAGNOSIS — M549 Dorsalgia, unspecified: Secondary | ICD-10-CM | POA: Diagnosis not present

## 2021-06-10 DIAGNOSIS — Z880 Allergy status to penicillin: Secondary | ICD-10-CM | POA: Diagnosis not present

## 2021-06-10 DIAGNOSIS — Z3A24 24 weeks gestation of pregnancy: Secondary | ICD-10-CM | POA: Diagnosis not present

## 2021-06-10 DIAGNOSIS — O26892 Other specified pregnancy related conditions, second trimester: Secondary | ICD-10-CM

## 2021-06-10 DIAGNOSIS — R1030 Lower abdominal pain, unspecified: Secondary | ICD-10-CM | POA: Diagnosis present

## 2021-06-10 DIAGNOSIS — O26899 Other specified pregnancy related conditions, unspecified trimester: Secondary | ICD-10-CM

## 2021-06-10 DIAGNOSIS — O99891 Other specified diseases and conditions complicating pregnancy: Secondary | ICD-10-CM | POA: Insufficient documentation

## 2021-06-10 DIAGNOSIS — Z20822 Contact with and (suspected) exposure to covid-19: Secondary | ICD-10-CM | POA: Diagnosis not present

## 2021-06-10 DIAGNOSIS — R109 Unspecified abdominal pain: Secondary | ICD-10-CM

## 2021-06-10 DIAGNOSIS — M7918 Myalgia, other site: Secondary | ICD-10-CM

## 2021-06-10 LAB — RESP PANEL BY RT-PCR (FLU A&B, COVID) ARPGX2
Influenza A by PCR: NEGATIVE
Influenza B by PCR: NEGATIVE
SARS Coronavirus 2 by RT PCR: NEGATIVE

## 2021-06-10 LAB — COMPREHENSIVE METABOLIC PANEL WITH GFR
ALT: 10 U/L (ref 0–44)
AST: 23 U/L (ref 15–41)
Albumin: 2.8 g/dL — ABNORMAL LOW (ref 3.5–5.0)
Alkaline Phosphatase: 106 U/L (ref 38–126)
Anion gap: 7 (ref 5–15)
BUN: 5 mg/dL — ABNORMAL LOW (ref 6–20)
CO2: 22 mmol/L (ref 22–32)
Calcium: 8.7 mg/dL — ABNORMAL LOW (ref 8.9–10.3)
Chloride: 108 mmol/L (ref 98–111)
Creatinine, Ser: 0.53 mg/dL (ref 0.44–1.00)
GFR, Estimated: >60 mL/min
Glucose, Bld: 72 mg/dL (ref 70–99)
Potassium: 4 mmol/L (ref 3.5–5.1)
Sodium: 137 mmol/L (ref 135–145)
Total Bilirubin: 0.3 mg/dL (ref 0.3–1.2)
Total Protein: 6.2 g/dL — ABNORMAL LOW (ref 6.5–8.1)

## 2021-06-10 LAB — URINALYSIS, ROUTINE W REFLEX MICROSCOPIC
Bilirubin Urine: NEGATIVE
Glucose, UA: NEGATIVE mg/dL
Hgb urine dipstick: NEGATIVE
Ketones, ur: NEGATIVE mg/dL
Nitrite: NEGATIVE
Protein, ur: NEGATIVE mg/dL
Specific Gravity, Urine: 1.02 (ref 1.005–1.030)
pH: 5 (ref 5.0–8.0)

## 2021-06-10 LAB — CBC
HCT: 34.6 % — ABNORMAL LOW (ref 36.0–46.0)
Hemoglobin: 11.4 g/dL — ABNORMAL LOW (ref 12.0–15.0)
MCH: 28.4 pg (ref 26.0–34.0)
MCHC: 32.9 g/dL (ref 30.0–36.0)
MCV: 86.1 fL (ref 80.0–100.0)
Platelets: 280 10*3/uL (ref 150–400)
RBC: 4.02 MIL/uL (ref 3.87–5.11)
RDW: 13 % (ref 11.5–15.5)
WBC: 10.7 10*3/uL — ABNORMAL HIGH (ref 4.0–10.5)
nRBC: 0 % (ref 0.0–0.2)

## 2021-06-10 LAB — LIPASE, BLOOD: Lipase: 26 U/L (ref 11–51)

## 2021-06-10 IMAGING — US US MFM OB LIMITED
1 series · 15 of 23 positions shown · non-contrast
Comparison: none

[Series 1: us mfm ob limited · 23 acquisitions, 15 frames shown]
[im 1/23]
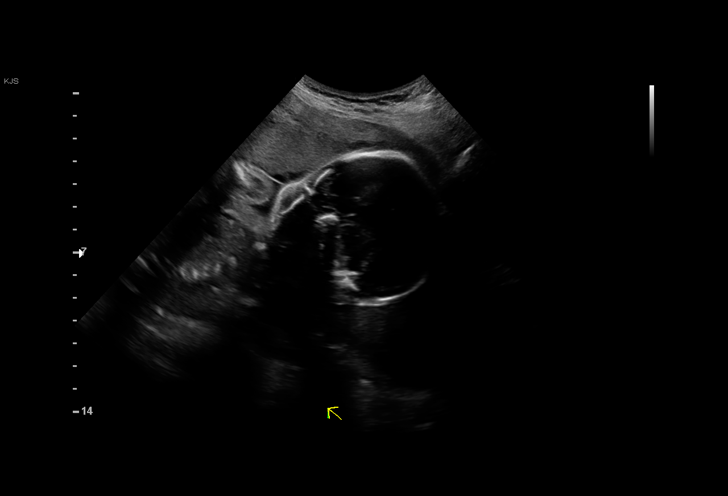
[im 3/23]
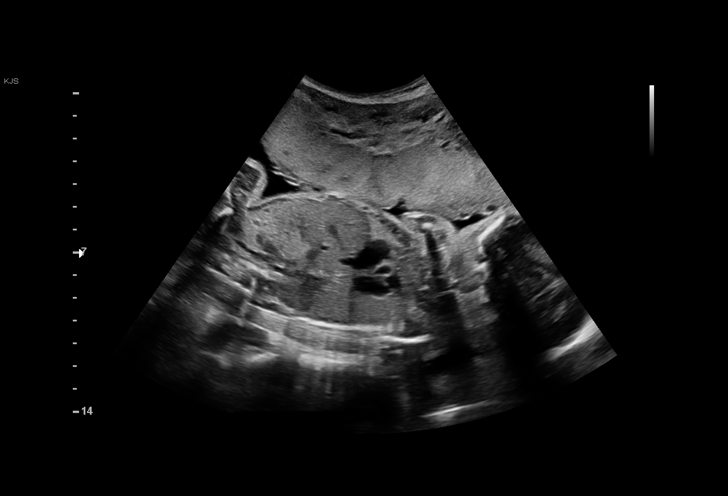
[im 4/23]
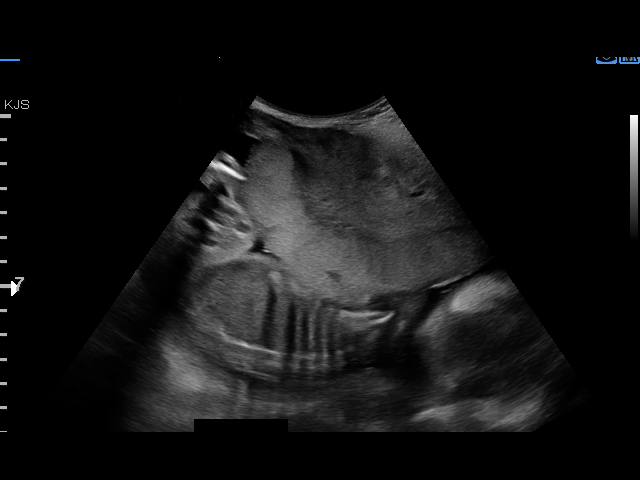
[im 6/23]
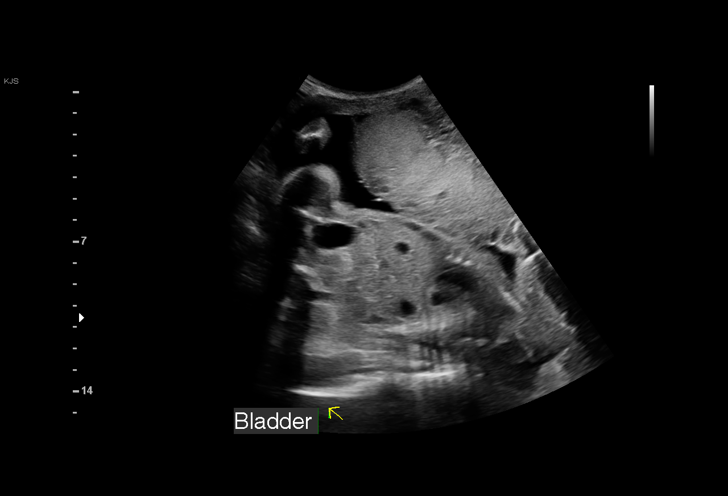
[im 7/23]
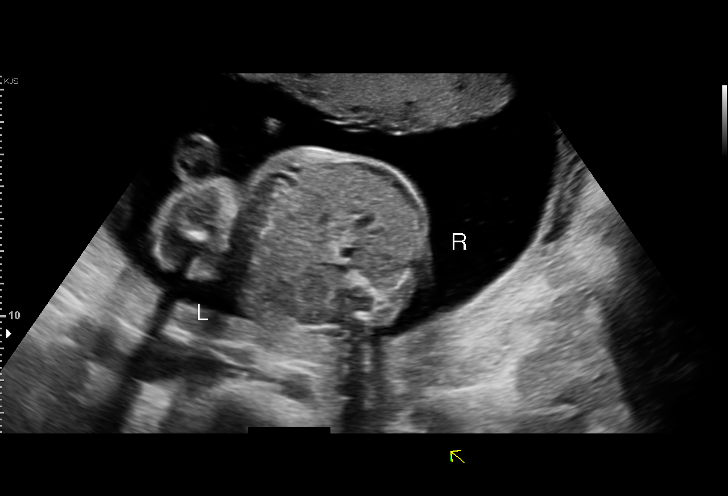
[im 9/23]
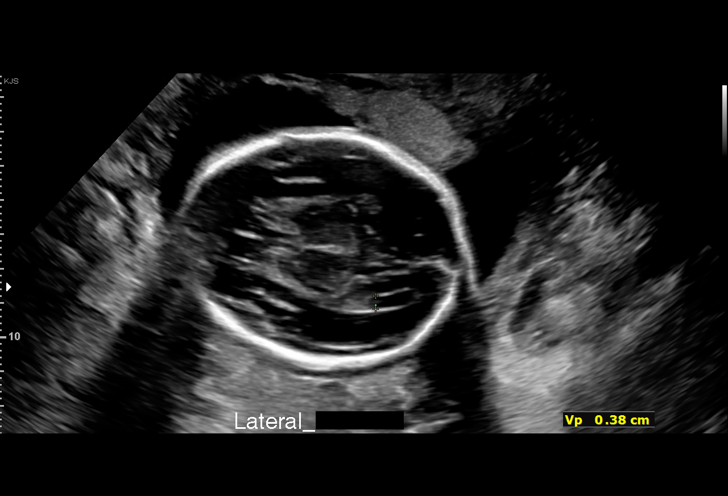
[im 10/23]
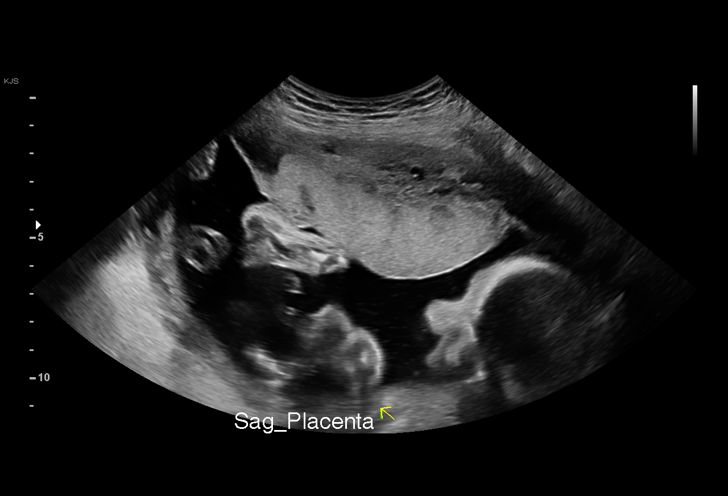
[im 12/23]
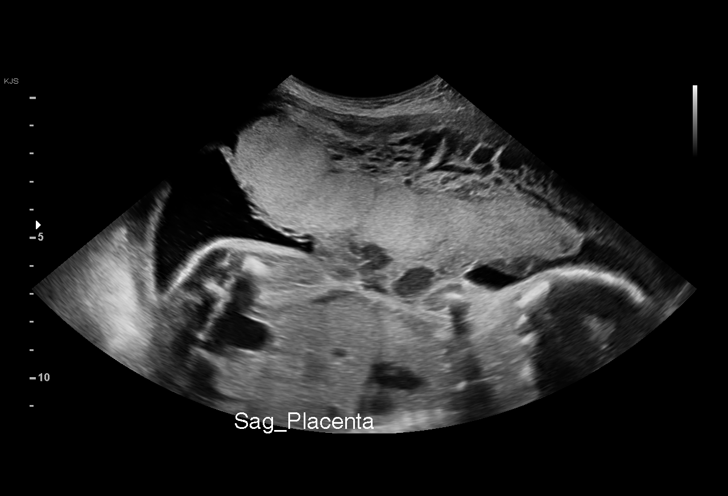
[im 14/23]
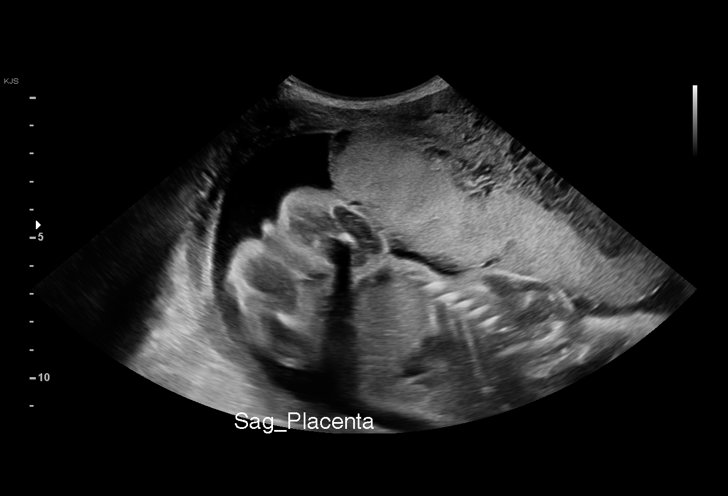
[im 15/23]
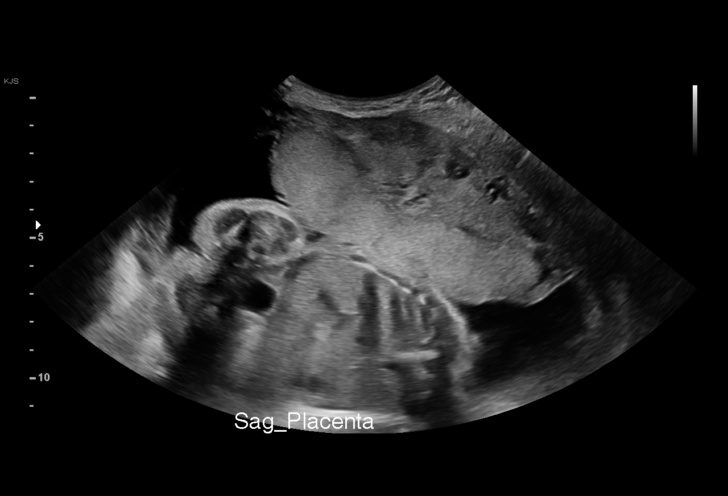
[im 17/23]
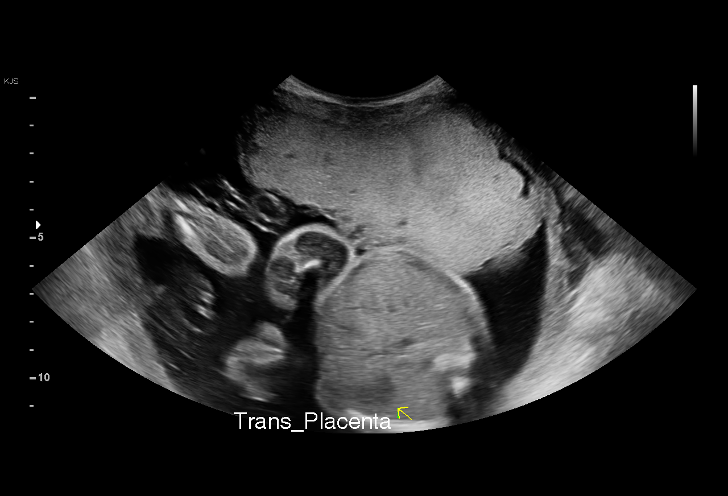
[im 18/23]
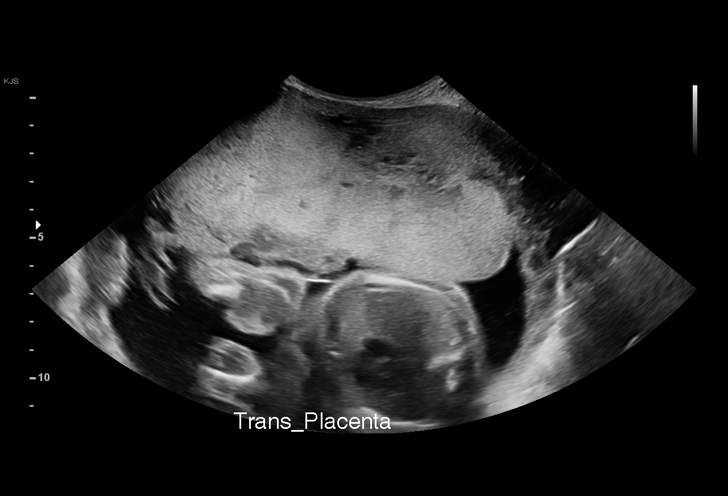
[im 20/23]
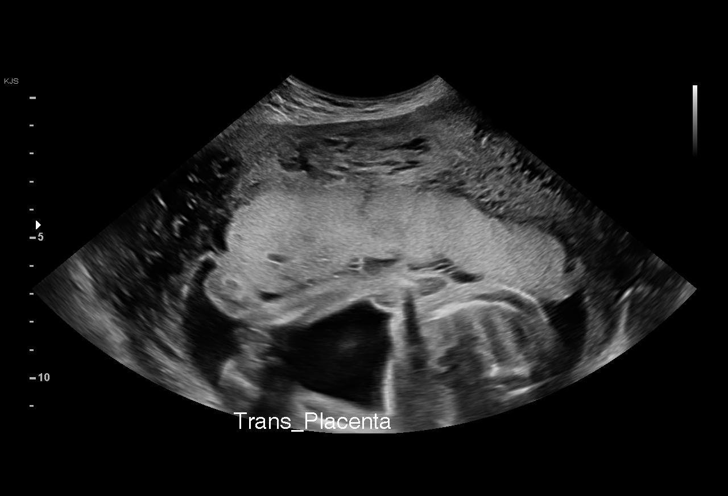
[im 21/23]
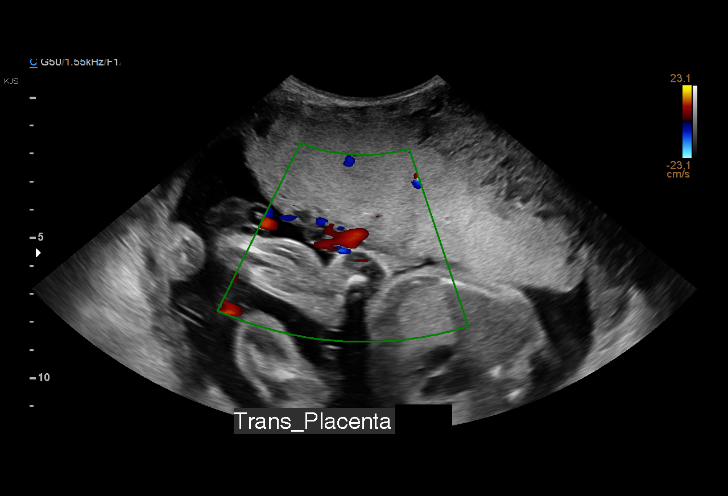
[im 23/23]
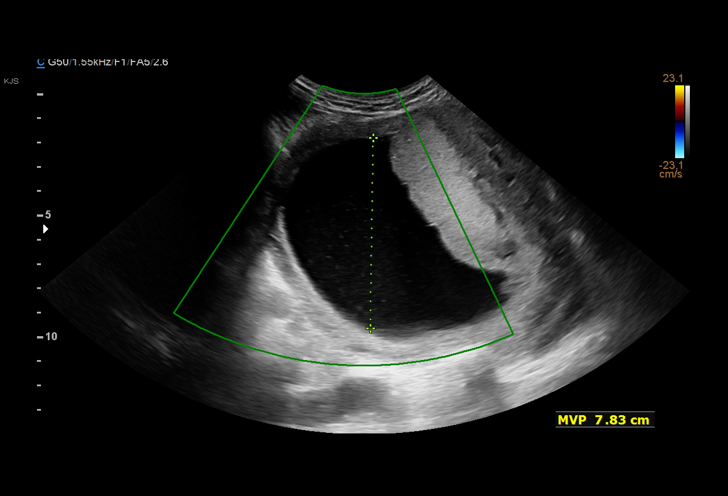

[15 of 23 positions shown; findings below may reference images not displayed]

Name:       SUSSU                  Visit Date: [DATE] [DATE]

Indications

 24 weeks gestation of pregnancy
 Abdominal pain in pregnancy                    [VU]
Fetal Evaluation

 Num Of Fetuses:         1
 Fetal Heart Rate(bpm):  143
 Cardiac Activity:       Observed
 Presentation:           Cephalic
 Placenta:               Anterior
 P. Cord Insertion:      Visualized, central

 Amniotic Fluid
 AFI FV:      Within normal limits

                             Largest Pocket(cm)

Biometry

 LV:        3.8  mm
Gestational Age

 Clinical EDD:  24w 1d                                        EDD:   [DATE]
 Best:          24w 1d     Det. By:  Clinical EDD             EDD:   [DATE]
Anatomy

 Diaphragm:             Appears normal         Kidneys:                Appear normal
 Stomach:               Appears normal, left
                        sided
Cervix Uterus Adnexa

 Cervix
 Not visualized (advanced GA >[VU])
Impression

 Patient was evaluated for abdominal pain .
 A limited ultrasound study was performed .Amniotic fluid is
 normal and good fetal activity is seen . Placenta appears
 normal.
                 SUSSU

## 2021-06-10 IMAGING — US US RENAL
1 series · 15 of 25 positions shown · non-contrast
Comparison: None.

CLINICAL DATA: Right flank pain

EXAM:
RENAL / URINARY TRACT ULTRASOUND COMPLETE

[Series 1: us renal · 15 of 31 slices shown]
[im 1/31]
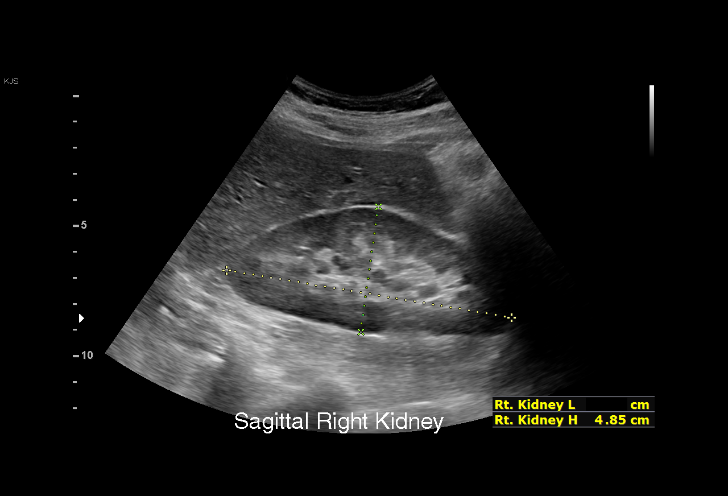
[im 3/31]
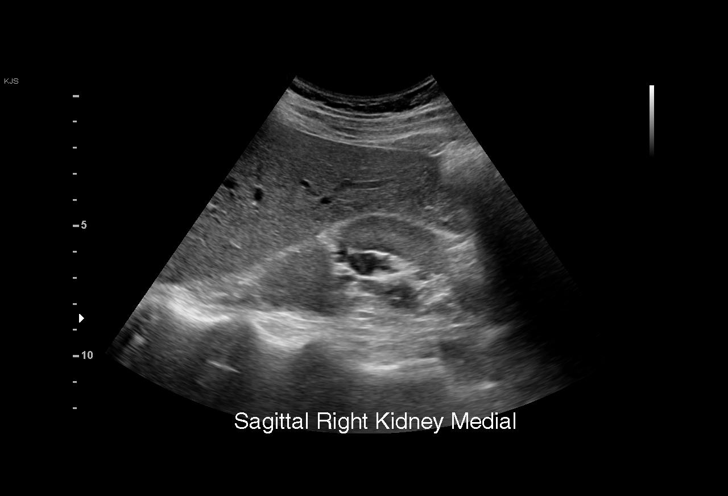
[im 6/31]
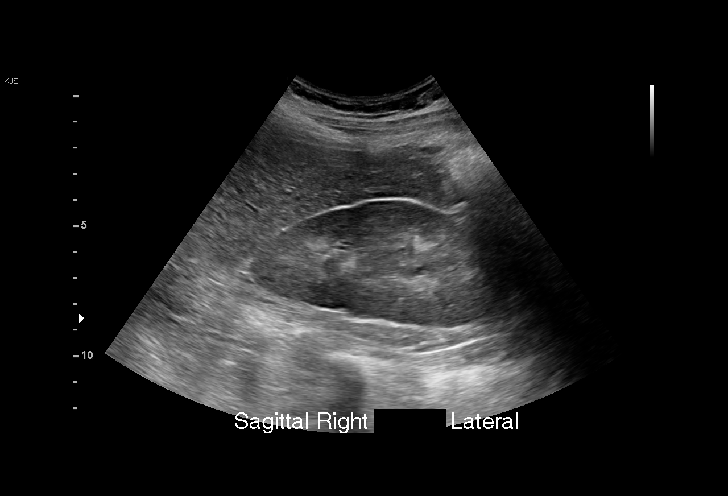
[im 7/31]
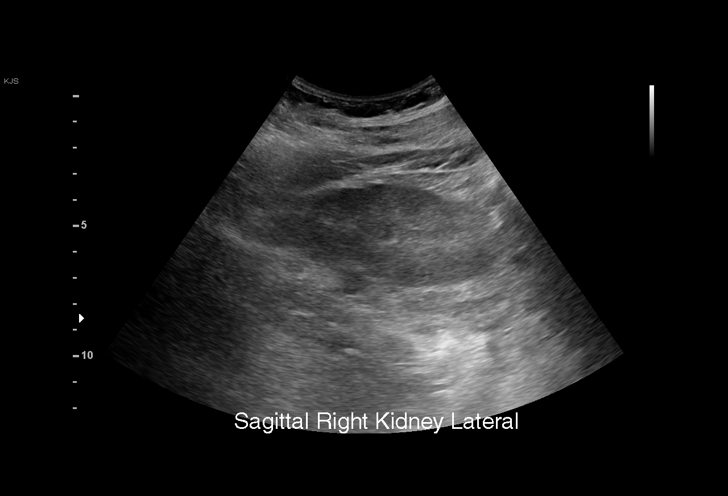
[im 9/31]
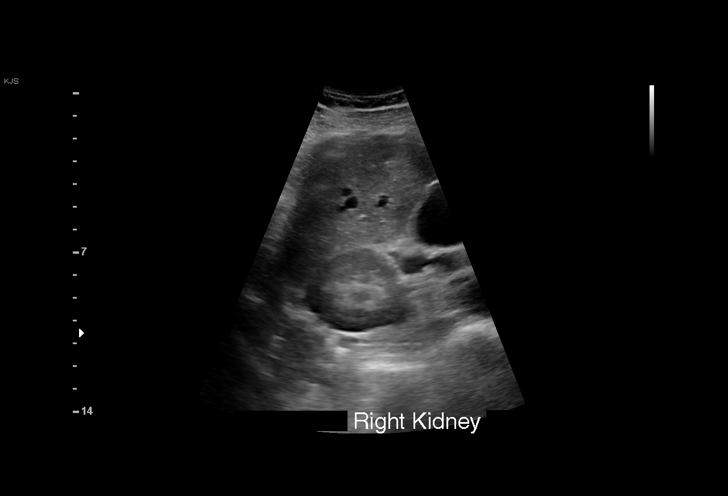
[im 12/31]
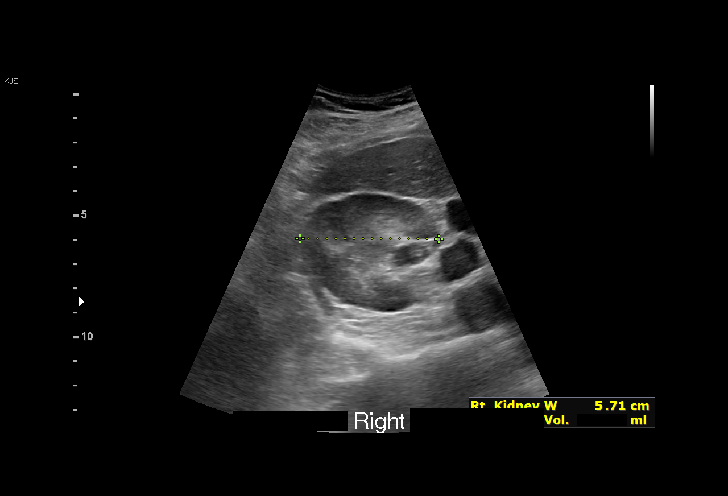
[im 13/31]
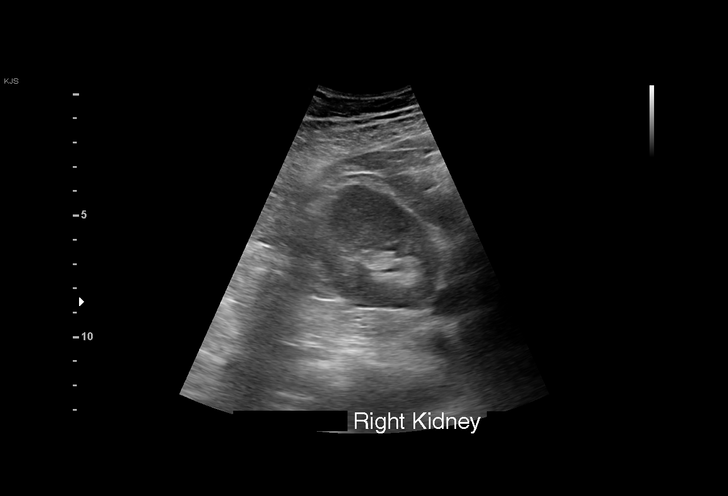
[im 16/31]
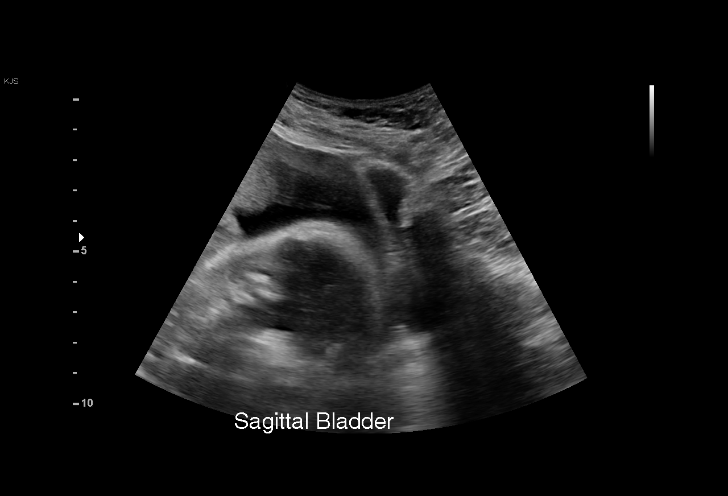
[im 18/31]
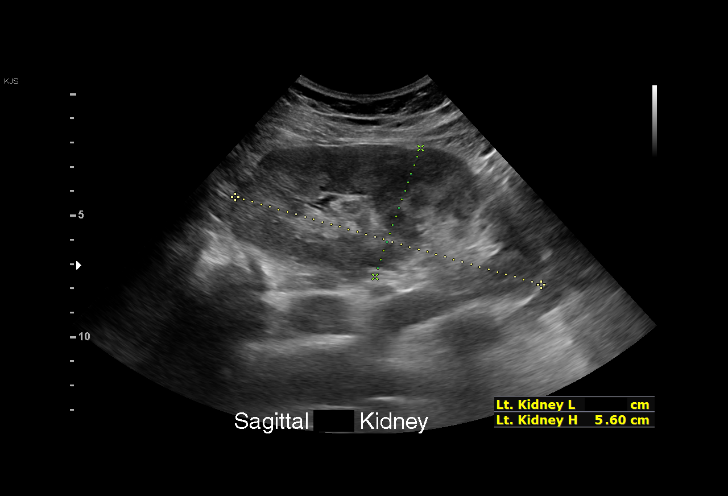
[im 19/31]
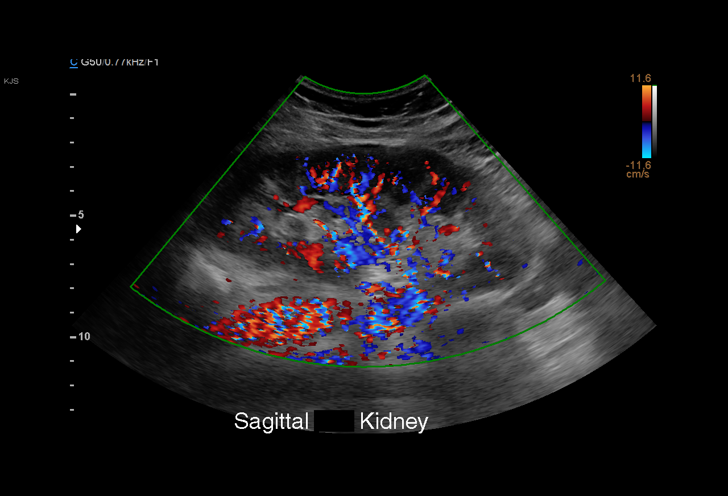
[im 22/31]
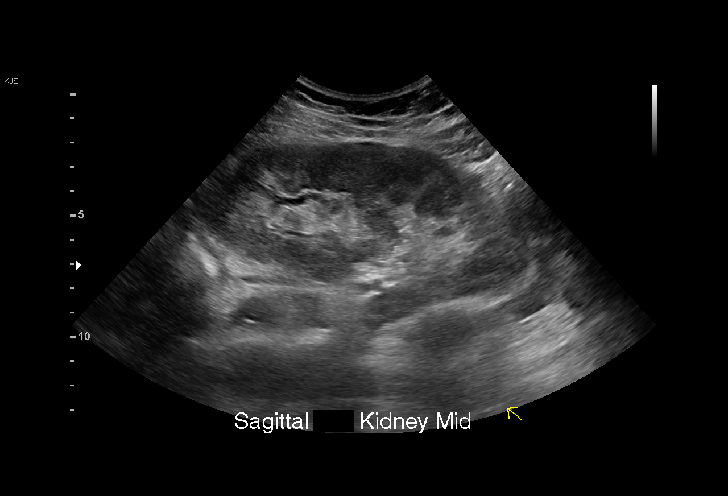
[im 24/31]
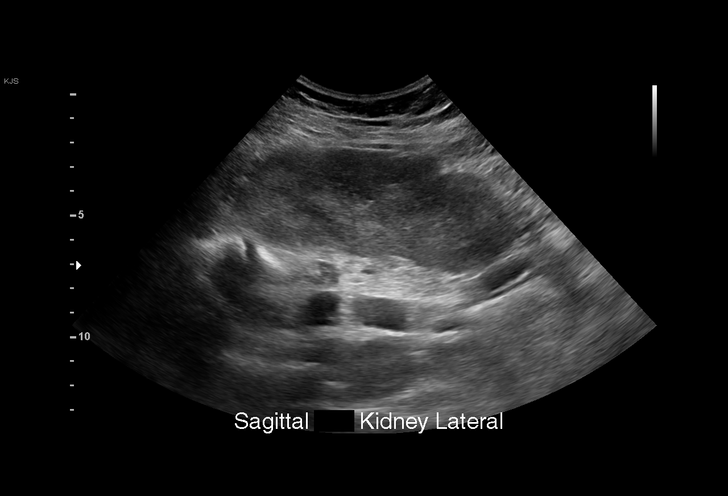
[im 26/31]
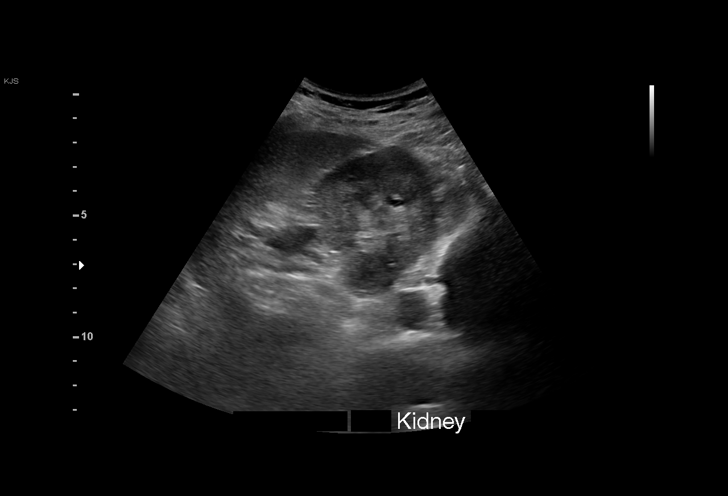
[im 28/31]
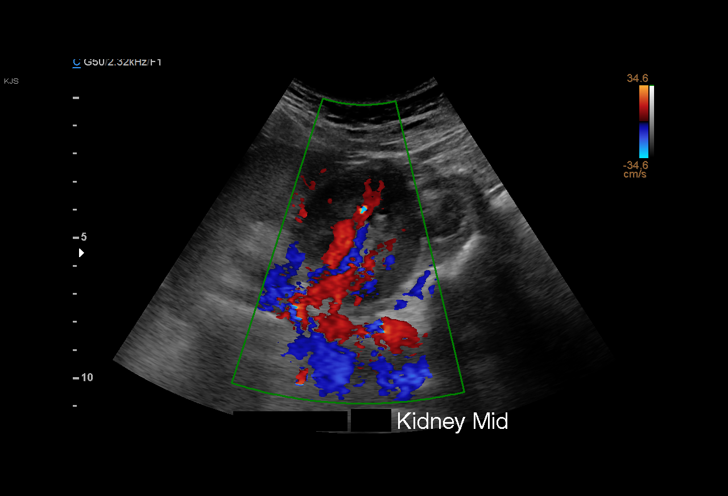
[im 31/31]
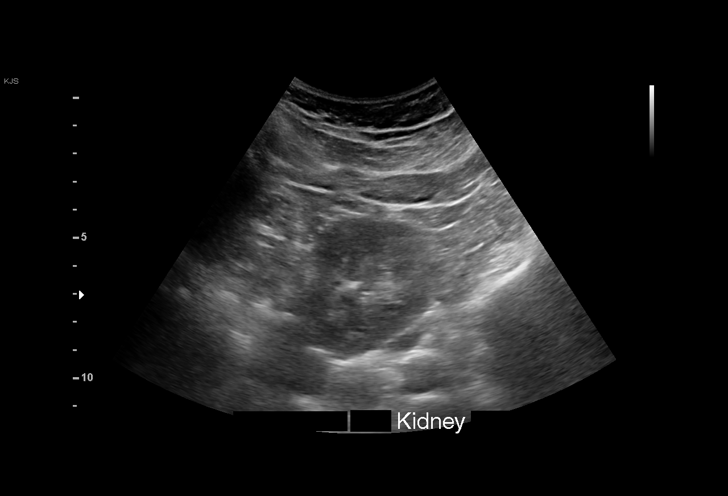

[15 of 25 positions shown; findings below may reference images not displayed]

FINDINGS: Right Kidney:

Renal measurements: 11.1 x 4.9 x 5.7 cm = volume: 161 mL.
Echogenicity within normal limits. No mass or hydronephrosis
visualized. No nephrolithiasis seen.

Left Kidney:

Renal measurements: 13.1 x 5.6 x 4.8 cm = volume: 187 mL.
Echogenicity within normal limits. No mass or hydronephrosis
visualized. No nephrolithiasis seen.

Bladder:

Appears normal for degree of bladder distention.

Other:

None.
IMPRESSION: Normal bilateral kidneys.  No hydronephrosis or nephrolithiasis.

## 2021-06-10 MED ORDER — CYCLOBENZAPRINE HCL 10 MG PO TABS: 10.0000 mg | ORAL_TABLET | Freq: Two times a day (BID) | ORAL | 0 refills | Status: DC | PRN

## 2021-06-10 MED ORDER — MORPHINE SULFATE (PF) 4 MG/ML IV SOLN
4.0000 mg | Freq: Once | INTRAVENOUS | Status: AC
  Administered 2021-06-10: 4 mg via INTRAVENOUS
  Filled 2021-06-10: qty 1

## 2021-06-10 MED ORDER — ONDANSETRON HCL 4 MG/2ML IJ SOLN
4.0000 mg | Freq: Once | INTRAMUSCULAR | Status: AC
  Administered 2021-06-10: 4 mg via INTRAVENOUS
  Filled 2021-06-10: qty 2

## 2021-06-10 MED ORDER — LACTATED RINGERS IV BOLUS
1000.0000 mL | Freq: Once | INTRAVENOUS | Status: AC
  Administered 2021-06-10: 1000 mL via INTRAVENOUS

## 2021-06-10 MED ORDER — CYCLOBENZAPRINE HCL 5 MG PO TABS
10.0000 mg | ORAL_TABLET | Freq: Once | ORAL | Status: AC
  Administered 2021-06-10: 10 mg via ORAL
  Filled 2021-06-10: qty 2

## 2021-06-10 NOTE — MAU Note (Signed)
...  Adriana Harrell is a 30 y.o. at [redacted]w[redacted]d here in MAU reporting: bilateral mid back pain that began yesterday afternoon. She states she experiences her pain more on her right side. She is also endorsing lower abdominal pain that is intermittent. She rates all of her pain a 6/10. She also endorses a new onset fever that began at 0700 this morning at 100.7 F. +FM. No VB or LOF. Last took 1000 mg of Tylenol at 0715.   Lab orders placed from triage: UA and COVID

## 2021-06-10 NOTE — MAU Provider Note (Signed)
Chief Complaint:  Abdominal Pain, Back Pain, and Fever   Event Date/Time   First Provider Initiated Contact with Patient 06/10/21 1017      HPI: Adriana Harrell is a 30 y.o. [id number] at [redacted]w[redacted]d by who presents to maternity admissions reporting right lower back, right flank, and right lower abdomen pain starting today. She reports some nausea associated with the pain but no vomiting.  She has tried heat and  Tylenol, which are not helping.  There are no other symptoms and she has not tried any other treatments.  She reports good fetal movement.   Location: right lower back, right flank and right lower abdomen Quality: cramping/aching Severity: 8/10 on pain scale Duration: 4-5 hours Timing: constant Modifying factors: not improved with Tylenol Associated signs and symptoms: nausea  HPI  Past Medical History: Past Medical History:  Diagnosis Date   Migraine     Past obstetric history: OB History  Gravida Para Term Preterm AB Living  SAB IAB Ectopic Multiple Live Births  1       2    # Outcome Date GA Lbr Len/2nd Weight Sex Delivery Anes PTL Lv  4 Current           3 Term 05/31/16     CS-Unspec     2 Term 07/15/14     CS-Unspec     1 SAB 2013 [redacted]w[redacted]d           Past Surgical History: Past Surgical History:  Procedure Laterality Date   CESAREAN SECTION     x2   TONSILLECTOMY      Family History: History reviewed. No pertinent family history.  Social History: Social History   Tobacco Use   Smoking status: Former   Smokeless tobacco: Never  Substance Use Topics   Alcohol use: Not Currently   Drug use: Not Currently    Allergies:  Allergies  Allergen Reactions   Penicillins Hives   Tamiflu [Oseltamivir Phosphate] Hives    Meds:  No medications prior to admission.    ROS:  Review of Systems  Constitutional:  Negative for chills, fatigue and fever.  Eyes:  Negative for visual disturbance.  Respiratory:  Negative for shortness of breath.    Cardiovascular:  Negative for chest pain.  Gastrointestinal:  Positive for abdominal pain and nausea. Negative for vomiting.  Genitourinary:  Positive for flank pain. Negative for difficulty urinating, dysuria, pelvic pain, vaginal bleeding, vaginal discharge and vaginal pain.  Musculoskeletal:  Positive for back pain.  Neurological:  Negative for dizziness and headaches.  Psychiatric/Behavioral: Negative.      I have reviewed patient's Past Medical Hx, Surgical Hx, Family Hx, Social Hx, medications and allergies.   Physical Exam  Patient Vitals for the past 24 hrs:  BP Temp Temp src Pulse Resp SpO2  06/10/21 1637 (!) 102/55 -- -- 75 16 100 %  06/10/21 1600 (!) 105/52 -- -- 64 15 100 %  06/10/21 0926 112/65 -- -- 78 -- 100 %  06/10/21 0921 107/67 98.8 F (37.1 C) Oral 92 17 100 %   Constitutional: Well-developed, well-nourished female in moderate distress.  Cardiovascular: normal rate Respiratory: normal effort GI: Abd soft, tender in RLQ, gravid appropriate for gestational age.  MS: Extremities nontender, no edema, normal ROM Neurologic: Alert and oriented x 4.  GU: Neg CVAT.  PELVIC EXAM: Deferred     FHT:  Baseline 150 , moderate variability, accelerations present, no decelerations Contractions: None  on toco or to palpation   Labs: Results for orders placed or performed during the hospital encounter of 06/10/21 (from the past 24 hour(s))  CBC     Status: Abnormal   Collection Time: 06/10/21  9:05 AM  Result Value Ref Range   WBC 10.7 (H) 4.0 - 10.5 K/uL   RBC 4.02 3.87 - 5.11 MIL/uL   Hemoglobin 11.4 (L) 12.0 - 15.0 g/dL   HCT 16.134.6 (L) 09.636.0 - 04.546.0 %   MCV 86.1 80.0 - 100.0 fL   MCH 28.4 26.0 - 34.0 pg   MCHC 32.9 30.0 - 36.0 g/dL   RDW 40.913.0 81.111.5 - 91.415.5 %   Platelets 280 150 - 400 K/uL   nRBC 0.0 0.0 - 0.2 %  Comprehensive metabolic panel     Status: Abnormal   Collection Time: 06/10/21  9:05 AM  Result Value Ref Range   Sodium 137 135 - 145 mmol/L   Potassium  4.0 3.5 - 5.1 mmol/L   Chloride 108 98 - 111 mmol/L   CO2 22 22 - 32 mmol/L   Glucose, Bld 72 70 - 99 mg/dL   BUN 5 (L) 6 - 20 mg/dL   Creatinine, Ser 7.820.53 0.44 - 1.00 mg/dL   Calcium 8.7 (L) 8.9 - 10.3 mg/dL   Total Protein 6.2 (L) 6.5 - 8.1 g/dL   Albumin 2.8 (L) 3.5 - 5.0 g/dL   AST 23 15 - 41 U/L   ALT 10 0 - 44 U/L   Alkaline Phosphatase 106 38 - 126 U/L   Total Bilirubin 0.3 0.3 - 1.2 mg/dL   GFR, Estimated >95>60 >62>60 mL/min   Anion gap 7 5 - 15  Lipase, blood     Status: None   Collection Time: 06/10/21  9:05 AM  Result Value Ref Range   Lipase 26 11 - 51 U/L  Urinalysis, Routine w reflex microscopic Urine, Clean Catch     Status: Abnormal   Collection Time: 06/10/21  9:26 AM  Result Value Ref Range   Color, Urine YELLOW YELLOW   APPearance CLOUDY (A) CLEAR   Specific Gravity, Urine 1.020 1.005 - 1.030   pH 5.0 5.0 - 8.0   Glucose, UA NEGATIVE NEGATIVE mg/dL   Hgb urine dipstick NEGATIVE NEGATIVE   Bilirubin Urine NEGATIVE NEGATIVE   Ketones, ur NEGATIVE NEGATIVE mg/dL   Protein, ur NEGATIVE NEGATIVE mg/dL   Nitrite NEGATIVE NEGATIVE   Leukocytes,Ua SMALL (A) NEGATIVE   RBC / HPF 0-5 0 - 5 RBC/hpf   WBC, UA 0-5 0 - 5 WBC/hpf   Bacteria, UA RARE (A) NONE SEEN   Squamous Epithelial / LPF 11-20 0 - 5   Mucus PRESENT   Resp Panel by RT-PCR (Flu A&B, Covid) Urine, Clean Catch     Status: None   Collection Time: 06/10/21  9:48 AM   Specimen: Urine, Clean Catch; Nasopharyngeal(NP) swabs in vial transport medium  Result Value Ref Range   SARS Coronavirus 2 by RT PCR NEGATIVE NEGATIVE   Influenza A by PCR NEGATIVE NEGATIVE   Influenza B by PCR NEGATIVE NEGATIVE      Imaging:  US RENAL  Result Date: 06/10/2021 CLINICAL DATA:  Right flank pain EXAM: RENAL / URINARY TRACT ULTRASOUND COMPLETE COMPARISON:  None. FINDINGS: Right Kidney: Renal measurements: 11.1 x 4.9 x 5.7 cm = volume: 161 mL. Echogenicity within normal limits. No mass or hydronephrosis visualized. No  nephrolithiasis seen. Left Kidney: Renal measurements: 13.1 x 5.6 x 4.8 cm = volume: 187  mL. Echogenicity within normal limits. No mass or hydronephrosis visualized. No nephrolithiasis seen. Bladder: Appears normal for degree of bladder distention. Other: None. IMPRESSION: Normal bilateral kidneys.  No hydronephrosis or nephrolithiasis. Electronically Signed   By: Wiliam Ke MD   On: 06/10/2021 12:12   [place] MFM OB Limited  Result Date: 06/10/2021 ----------------------------------------------------------------------  OBSTETRICS REPORT                       (Signed Final 06/10/2021 06:07 pm) ---------------------------------------------------------------------- Patient Info  ID #:       [id number]                          D.O.B.:  April 29, 1991 (30 yrs)  Name:       Adriana Harrell                  Visit Date: 06/10/2021 02:44 pm ---------------------------------------------------------------------- Performed By  Attending:        Noralee Space MD        Ref. Address:     [address]                                                             [address]                                                             Jacky Kindle  Performed By:     Marcellina Millin       [facility]  Referred By:      Wilmer Floor LEFTWICH-                    KIRBY CNM ---------------------------------------------------------------------- Orders  #  Description                           Code        Ordered By  1  [place] MFM OB LIMITED                     84166.06    Mattye Verdone LEFTWICH-                                                       KIRBY ----------------------------------------------------------------------  #  Order #                     Accession #  Episode #  1  [id number]                   [id number]                 [id number] ---------------------------------------------------------------------- Indications  [redacted] weeks gestation of pregnancy                 Z3A.24  Abdominal pain in pregnancy                    O99.89 ---------------------------------------------------------------------- Fetal Evaluation  Num Of Fetuses:         1  Fetal Heart Rate(bpm):  143  Cardiac Activity:       Observed  Presentation:           Cephalic  Placenta:               Anterior  P. Cord Insertion:      Visualized, central  Amniotic Fluid  AFI FV:      Within normal limits                              Largest Pocket(cm)                              7.83 ---------------------------------------------------------------------- Biometry  LV:        3.8  mm ---------------------------------------------------------------------- Gestational Age  Clinical EDD:  24w 1d                                        EDD:   09/29/21  Best:          24w 1d     Det. By:  Clinical EDD             EDD:   09/29/21 ---------------------------------------------------------------------- Anatomy  Diaphragm:             Appears normal         Kidneys:                Appear normal  Stomach:               Appears normal, left                         sided ---------------------------------------------------------------------- Cervix Uterus Adnexa  Cervix  Not visualized (advanced GA >24wks) ---------------------------------------------------------------------- Impression  Patient was evaluated for abdominal pain .  A limited ultrasound study was performed .Amniotic fluid is  normal and good fetal activity is seen . Placenta appears  normal. ----------------------------------------------------------------------                  Noralee Space, MD Electronically Signed Final Report   06/10/2021 06:07 pm ----------------------------------------------------------------------  US ABDOMEN LIMITED RUQ (LIVER/GB)  Result Date: 05/19/2021 CLINICAL DATA:  Right upper quadrant pain pregnant patient EXAM: ULTRASOUND ABDOMEN LIMITED RIGHT UPPER QUADRANT COMPARISON:  None. FINDINGS: Gallbladder: No gallstones or wall  thickening visualized. No sonographic Murphy sign noted by sonographer. Common bile duct: Diameter: 2.6 mm Liver: No focal lesion identified. Within normal limits in parenchymal echogenicity. Portal vein is patent on color Doppler imaging with normal direction of blood flow towards the liver. Other: None. IMPRESSION: Negative examination Electronically Signed   By: Jasmine Pang  M.D.   On: 05/19/2021 19:55    MAU Course/MDM: Orders Placed This Encounter  Procedures   Resp Panel by RT-PCR (Flu A&B, Covid) Nasopharyngeal Swab   US RENAL   US MFM OB Limited   Urinalysis, Routine w reflex microscopic Urine, Clean Catch   CBC   Comprehensive metabolic panel   Lipase, blood   Airborne and Contact precautions   Saline lock IV   Discharge patient    Meds ordered this encounter  Medications   lactated ringers bolus 1,000 mL   morphine 4 MG/ML injection 4 mg   ondansetron (ZOFRAN) injection 4 mg   cyclobenzaprine (FLEXERIL) tablet 10 mg   cyclobenzaprine (FLEXERIL) 10 MG tablet    Sig: Take 1 tablet (10 mg total) by mouth 2 (two) times daily as needed for muscle spasms.    Dispense:  20 tablet    Refill:  0    Order Specific Question:   Supervising Provider    Answer:   Kathrynn Running [[id number]]     NST reviewed and appropriate for gestational age Pt pain treated with morphine 4 mg IV with some improvement then Flexeril 10 mg PO with slightly more improvement. No acute abdomen on exam, no elevated WBCs or other indications of infection.  OB US without placental abnormalities.  Renal US normal and no hematuria.  Consult Dr Macon Large and reviewed pt presentation, labs, and imaging.  D/C home with pain management and return precautions. Rx for Flexeril.  Assessment: 1. Back pain affecting pregnancy in second trimester   2. Right flank pain   3. Abdominal pain affecting pregnancy   4. [redacted] weeks gestation of pregnancy   5. Musculoskeletal pain     Plan: Discharge home Labor precautions and  fetal kick counts  Follow-up Information     [facility] Follow up.   Why: As scheduled Contact information: [address] [place] [address] [phone]         [facility] Follow up.   Specialty: Obstetrics and Gynecology Why: As needed for emergencies Contact information: [address] [id number] [place]he[place] [address] [phone]               Allergies as of 06/10/2021       Reactions   Penicillins Hives   Tamiflu [oseltamivir Phosphate] Hives        Medication List     TAKE these medications    acetaminophen 500 MG tablet Commonly known as: TYLENOL Take 1,000 mg by mouth every 6 (six) hours as needed.   albuterol 108 (90 Base) MCG/ACT inhaler Commonly known as: VENTOLIN HFA Inhale 2 puffs into the lungs every 6 (six) hours as needed for wheezing or shortness of breath.   cyclobenzaprine 10 MG tablet Commonly known as: FLEXERIL Take 1 tablet (10 mg total) by mouth 2 (two) times daily as needed for muscle spasms.   prenatal multivitamin Tabs tablet Take 1 tablet by mouth daily at 12 noon.   simethicone 80 MG chewable tablet Commonly known as: Gas-X Chew 1-2 tablets (80-160 mg total) by mouth every 6 (six) hours as needed for flatulence.   traMADol 50 MG tablet Commonly known as: ULTRAM Take 1-2 tablets (50-100 mg total) by mouth every 6 (six) hours as needed.        Sharen Counter Certified Nurse-Midwife 06/10/2021 8:01 PM

## 2021-07-02 LAB — OB RESULTS CONSOLE HIV ANTIBODY (ROUTINE TESTING): HIV: NONREACTIVE

## 2021-07-03 ENCOUNTER — Encounter (HOSPITAL_COMMUNITY): Payer: Self-pay | Admitting: Obstetrics and Gynecology

## 2021-07-03 ENCOUNTER — Other Ambulatory Visit: Payer: Self-pay

## 2021-07-03 ENCOUNTER — Inpatient Hospital Stay (HOSPITAL_COMMUNITY)
Admission: AD | Admit: 2021-07-03 | Discharge: 2021-07-03 | Disposition: A | Discharge status: Home / Self Care | Payer: BC Managed Care – PPO | Attending: Obstetrics and Gynecology | Admitting: Obstetrics and Gynecology

## 2021-07-03 DIAGNOSIS — Z3A27 27 weeks gestation of pregnancy: Secondary | ICD-10-CM | POA: Diagnosis not present

## 2021-07-03 DIAGNOSIS — Z880 Allergy status to penicillin: Secondary | ICD-10-CM | POA: Diagnosis not present

## 2021-07-03 DIAGNOSIS — G43009 Migraine without aura, not intractable, without status migrainosus: Secondary | ICD-10-CM | POA: Diagnosis not present

## 2021-07-03 DIAGNOSIS — Z87891 Personal history of nicotine dependence: Secondary | ICD-10-CM | POA: Insufficient documentation

## 2021-07-03 DIAGNOSIS — Z79899 Other long term (current) drug therapy: Secondary | ICD-10-CM | POA: Diagnosis not present

## 2021-07-03 DIAGNOSIS — O99352 Diseases of the nervous system complicating pregnancy, second trimester: Secondary | ICD-10-CM

## 2021-07-03 LAB — URINALYSIS, ROUTINE W REFLEX MICROSCOPIC
Bilirubin Urine: NEGATIVE
Glucose, UA: NEGATIVE mg/dL
Hgb urine dipstick: NEGATIVE
Ketones, ur: NEGATIVE mg/dL
Leukocytes,Ua: NEGATIVE
Nitrite: NEGATIVE
Protein, ur: NEGATIVE mg/dL
Specific Gravity, Urine: 1.014 (ref 1.005–1.030)
pH: 6 (ref 5.0–8.0)

## 2021-07-03 MED ORDER — IBUPROFEN 600 MG PO TABS
600.0000 mg | ORAL_TABLET | Freq: Once | ORAL | Status: AC
  Administered 2021-07-03: 600 mg via ORAL
  Filled 2021-07-03: qty 1

## 2021-07-03 MED ORDER — BUTALBITAL-APAP-CAFFEINE 50-325-40 MG PO TABS: 1.0000 | ORAL_TABLET | Freq: Four times a day (QID) | ORAL | 0 refills | Status: DC | PRN

## 2021-07-03 MED ORDER — METOCLOPRAMIDE HCL 5 MG/ML IJ SOLN
10.0000 mg | Freq: Once | INTRAMUSCULAR | Status: AC
  Administered 2021-07-03: 10 mg via INTRAMUSCULAR
  Filled 2021-07-03: qty 2

## 2021-07-03 MED ORDER — BUTALBITAL-APAP-CAFFEINE 50-325-40 MG PO TABS
2.0000 | ORAL_TABLET | Freq: Once | ORAL | Status: AC
  Administered 2021-07-03: 2 via ORAL
  Filled 2021-07-03: qty 2

## 2021-07-03 MED ORDER — METOCLOPRAMIDE HCL 10 MG PO TABS: 10.0000 mg | ORAL_TABLET | Freq: Three times a day (TID) | ORAL | 1 refills | Status: DC

## 2021-07-03 NOTE — MAU Note (Signed)
Adriana Harrell is a 30 y.o. at [redacted]w[redacted]d here in MAU reporting: headache since yesterday- no relief with tylenol, rest, or a bath. Has had migraines in the past but none recently.   Onset of complaint: yesterday  Pain score: 7/10  Vitals:   07/03/21 1359  BP: (!) 98/56  Pulse: 96  Resp: 16  Temp: 98.2 F (36.8 C)  SpO2: 100%     FHT:150  Lab orders placed from triage: UA

## 2021-07-03 NOTE — MAU Provider Note (Signed)
History     CSN: [id number]  Arrival date and time: 07/03/21 1312   None     No chief complaint on file.  HPI  Ms.Adriana Harrell is a 30 y.o. female [phone] @ [redacted]w[redacted]d here in MAU with complaints of migraine HA. She has a history of migraine HA's, however has not had them recently or throughout her pregnancy.  She reports  +photophoia, nausea and vomiting. She took a dose of tylenol which did not help. The last time she took it was 11:30 am. The headache is located all over her head. It feels like someone is squeezing her head.  The headache is constant. She rates her pain 7/10.  OB History     Gravida  4   Para  2   Term  2   Preterm      AB  1   Living  2      SAB  1   IAB      Ectopic      Multiple      Live Births  2           Past Medical History:  Diagnosis Date   Migraine     Past Surgical History:  Procedure Laterality Date   CESAREAN SECTION     x2   TONSILLECTOMY      No family history on file.  Social History   Tobacco Use   Smoking status: Former   Smokeless tobacco: Never  Substance Use Topics   Alcohol use: Not Currently   Drug use: Not Currently    Allergies:  Allergies  Allergen Reactions   Penicillins Hives   Tamiflu [Oseltamivir Phosphate] Hives    Medications Prior to Admission  Medication Sig Dispense Refill Last Dose   acetaminophen (TYLENOL) 500 MG tablet Take 1,000 mg by mouth every 6 (six) hours as needed.   07/03/2021   Prenatal Vit-Fe Fumarate-FA (PRENATAL MULTIVITAMIN) TABS tablet Take 1 tablet by mouth daily at 12 noon.   07/03/2021   albuterol (PROVENTIL HFA;VENTOLIN HFA) 108 (90 Base) MCG/ACT inhaler Inhale 2 puffs into the lungs every 6 (six) hours as needed for wheezing or shortness of breath. 1 Inhaler 2 More than a month   cyclobenzaprine (FLEXERIL) 10 MG tablet Take 1 tablet (10 mg total) by mouth 2 (two) times daily as needed for muscle spasms. 20 tablet 0 More than a month   simethicone (GAS-X) 80 MG  chewable tablet Chew 1-2 tablets (80-160 mg total) by mouth every 6 (six) hours as needed for flatulence. 30 tablet 0 More than a month   traMADol (ULTRAM) 50 MG tablet Take 1-2 tablets (50-100 mg total) by mouth every 6 (six) hours as needed. 10 tablet 0 More than a month   Results for orders placed or performed during the hospital encounter of 07/03/21 (from the past 48 hour(s))  Urinalysis, Routine w reflex microscopic Urine, Clean Catch     Status: Abnormal   Collection Time: 07/03/21  2:26 PM  Result Value Ref Range   Color, Urine YELLOW YELLOW   APPearance HAZY (A) CLEAR   Specific Gravity, Urine 1.014 1.005 - 1.030   pH 6.0 5.0 - 8.0   Glucose, UA NEGATIVE NEGATIVE mg/dL   Hgb urine dipstick NEGATIVE NEGATIVE   Bilirubin Urine NEGATIVE NEGATIVE   Ketones, ur NEGATIVE NEGATIVE mg/dL   Protein, ur NEGATIVE NEGATIVE mg/dL   Nitrite NEGATIVE NEGATIVE   Leukocytes,Ua NEGATIVE NEGATIVE    Comment: Performed at [facility], [address], [place], [place] [address]   Review of Systems  Constitutional:  Negative for fever.  Eyes:  Positive for photophobia. Negative for pain and visual disturbance.  Neurological:  Positive for headaches. Negative for syncope, weakness and light-headedness.  Physical Exam   Blood pressure (!) 98/56, pulse 96, temperature 98.2 F (36.8 C), temperature source Oral, resp. rate 16, height 5\' 2"  (1.575 m), weight 67.1 kg, last menstrual period 12/11/2020, SpO2 100 %.  Physical Exam Vitals and nursing note reviewed.  Constitutional:      General: She is not in acute distress.    Appearance: Normal appearance. She is not ill-appearing, toxic-appearing or diaphoretic.  HENT:     Head: Normocephalic.  Eyes:     Pupils: Pupils are equal, round, and reactive to light.  Musculoskeletal:        General: Normal range of motion.  Neurological:     General: No focal deficit present.     Mental Status: She is alert and oriented to person, place, and  time.     GCS: GCS eye subscore is 4. GCS verbal subscore is 5. GCS motor subscore is 6.     Motor: Motor function is intact.     Coordination: Coordination is intact.  Psychiatric:        Behavior: Behavior normal.   Fetal Tracing: Baseline: 135 bpm Variability: Moderate  Accelerations: 15x15 Decelerations: None Toco: Occasional.   MAU Course  Procedures None  MDM  Coke given PO for caffeine Reglan IM & ibuprofen 600 mg PO Pain now down to 5/10. Fioricet given 2 tablets, pain now down to 2/10.   Assessment and Plan   A:  1. Migraine without aura and without status migrainosus, not intractable   2. [redacted] weeks gestation of pregnancy      P:  Discharge home in stable condition Rx: Fioricet, Reglan Continue tylenol as direction on the bottle. Return to MAU if symptoms worsen Increase oral fluid intake.   02/08/2021 I, NP 07/03/2021 7:03 PM

## 2021-07-09 ENCOUNTER — Other Ambulatory Visit: Payer: Self-pay

## 2021-07-09 ENCOUNTER — Inpatient Hospital Stay (HOSPITAL_COMMUNITY): Payer: BC Managed Care – PPO

## 2021-07-09 ENCOUNTER — Inpatient Hospital Stay (HOSPITAL_COMMUNITY)
Admission: AD | Admit: 2021-07-09 | Discharge: 2021-07-18 | DRG: 833 | Disposition: A | Discharge status: Home / Self Care | Payer: BC Managed Care – PPO | Attending: Obstetrics and Gynecology | Admitting: Obstetrics and Gynecology

## 2021-07-09 ENCOUNTER — Encounter (HOSPITAL_COMMUNITY): Payer: Self-pay | Admitting: Obstetrics and Gynecology

## 2021-07-09 DIAGNOSIS — O26893 Other specified pregnancy related conditions, third trimester: Secondary | ICD-10-CM | POA: Diagnosis not present

## 2021-07-09 DIAGNOSIS — O99353 Diseases of the nervous system complicating pregnancy, third trimester: Secondary | ICD-10-CM | POA: Diagnosis not present

## 2021-07-09 DIAGNOSIS — N898 Other specified noninflammatory disorders of vagina: Secondary | ICD-10-CM | POA: Diagnosis not present

## 2021-07-09 DIAGNOSIS — O99283 Endocrine, nutritional and metabolic diseases complicating pregnancy, third trimester: Secondary | ICD-10-CM | POA: Diagnosis present

## 2021-07-09 DIAGNOSIS — Z01818 Encounter for other preprocedural examination: Secondary | ICD-10-CM

## 2021-07-09 DIAGNOSIS — Z3480 Encounter for supervision of other normal pregnancy, unspecified trimester: Secondary | ICD-10-CM

## 2021-07-09 DIAGNOSIS — Z87891 Personal history of nicotine dependence: Secondary | ICD-10-CM

## 2021-07-09 DIAGNOSIS — E876 Hypokalemia: Secondary | ICD-10-CM | POA: Diagnosis present

## 2021-07-09 DIAGNOSIS — G932 Benign intracranial hypertension: Secondary | ICD-10-CM | POA: Diagnosis not present

## 2021-07-09 DIAGNOSIS — Z20822 Contact with and (suspected) exposure to covid-19: Secondary | ICD-10-CM | POA: Diagnosis present

## 2021-07-09 DIAGNOSIS — Z3492 Encounter for supervision of normal pregnancy, unspecified, second trimester: Secondary | ICD-10-CM

## 2021-07-09 DIAGNOSIS — R519 Headache, unspecified: Secondary | ICD-10-CM | POA: Diagnosis not present

## 2021-07-09 DIAGNOSIS — Z3A28 28 weeks gestation of pregnancy: Secondary | ICD-10-CM

## 2021-07-09 DIAGNOSIS — Z79899 Other long term (current) drug therapy: Secondary | ICD-10-CM

## 2021-07-09 DIAGNOSIS — Z3490 Encounter for supervision of normal pregnancy, unspecified, unspecified trimester: Secondary | ICD-10-CM

## 2021-07-09 LAB — URINALYSIS, ROUTINE W REFLEX MICROSCOPIC
Bilirubin Urine: NEGATIVE
Glucose, UA: NEGATIVE mg/dL
Hgb urine dipstick: NEGATIVE
Ketones, ur: NEGATIVE mg/dL
Leukocytes,Ua: NEGATIVE
Nitrite: NEGATIVE
Protein, ur: NEGATIVE mg/dL
Specific Gravity, Urine: <1.005 — ABNORMAL LOW (ref 1.005–1.030)
pH: 7 (ref 5.0–8.0)

## 2021-07-09 LAB — COMPREHENSIVE METABOLIC PANEL
ALT: 30 U/L (ref 0–44)
AST: 34 U/L (ref 15–41)
Albumin: 2.5 g/dL — ABNORMAL LOW (ref 3.5–5.0)
Alkaline Phosphatase: 113 U/L (ref 38–126)
Anion gap: 7 (ref 5–15)
BUN: <5 mg/dL — ABNORMAL LOW (ref 6–20)
CO2: 23 mmol/L (ref 22–32)
Calcium: 8.4 mg/dL — ABNORMAL LOW (ref 8.9–10.3)
Chloride: 106 mmol/L (ref 98–111)
Creatinine, Ser: 0.51 mg/dL (ref 0.44–1.00)
GFR, Estimated: >60 mL/min (ref >60)
Glucose, Bld: 81 mg/dL (ref 70–99)
Potassium: 3.4 mmol/L — ABNORMAL LOW (ref 3.5–5.1)
Sodium: 136 mmol/L (ref 135–145)
Total Bilirubin: 0.3 mg/dL (ref 0.3–1.2)
Total Protein: 6 g/dL — ABNORMAL LOW (ref 6.5–8.1)

## 2021-07-09 LAB — CBC
HCT: 31.1 % — ABNORMAL LOW (ref 36.0–46.0)
Hemoglobin: 10.2 g/dL — ABNORMAL LOW (ref 12.0–15.0)
MCH: 28 pg (ref 26.0–34.0)
MCHC: 32.8 g/dL (ref 30.0–36.0)
MCV: 85.4 fL (ref 80.0–100.0)
Platelets: 255 10*3/uL (ref 150–400)
RBC: 3.64 MIL/uL — ABNORMAL LOW (ref 3.87–5.11)
RDW: 13.1 % (ref 11.5–15.5)
WBC: 12.5 10*3/uL — ABNORMAL HIGH (ref 4.0–10.5)
nRBC: 0 % (ref 0.0–0.2)

## 2021-07-09 LAB — PROTEIN / CREATININE RATIO, URINE
Creatinine, Urine: 29.14 mg/dL
Total Protein, Urine: <6 mg/dL

## 2021-07-09 IMAGING — MR MR HEAD W/O CM
14 of 15 series · 44 of 48 positions shown · non-contrast
Comparison: Comparison made with prior MRV performed earlier the
same day.

CLINICAL DATA: Initial evaluation for acute headache trauma
currently pregnant.

EXAM:
MRI HEAD WITHOUT CONTRAST
TECHNIQUE: Multiplanar, multiecho pulse sequences of the brain and surrounding
structures were obtained without intravenous contrast.

[Series 5: DWI · axial · 3.0mm · 0.88mm/px · z∈[-90,+56]mm · 7 of 104 slices shown (1 of 6)]
[im 1/104]
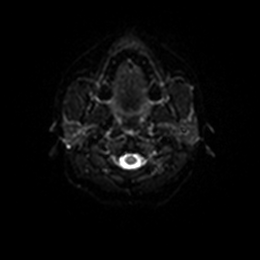
[im 18/104]
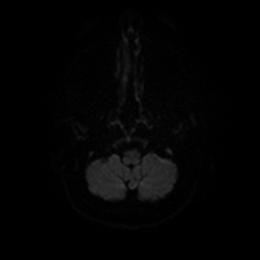
[im 35/104]
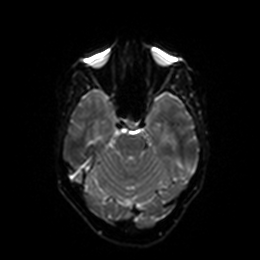
[im 52/104]
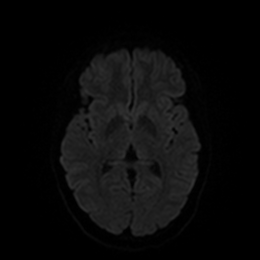
[im 69/104]
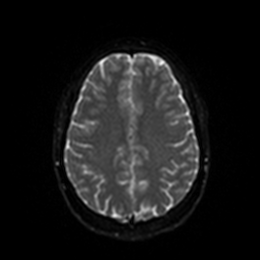
[im 86/104]
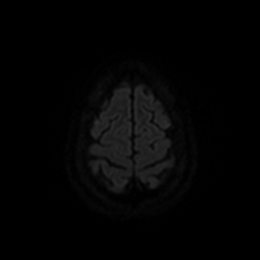
[im 104/104]
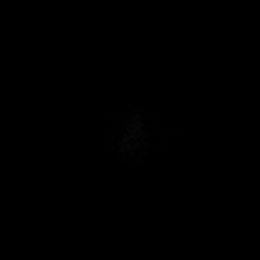

[Series 6: DWI · axial · 3.0mm · 0.88mm/px · z∈[-90,+56]mm · 3 of 51 slices shown (2 of 6)]
[im 1/51]
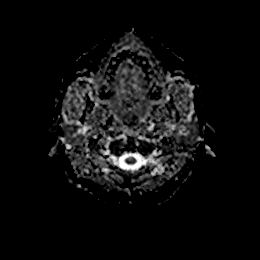
[im 26/51]
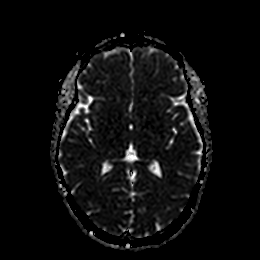
[im 51/51]
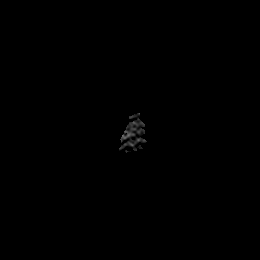

[Series 7: DWI · coronal · 4.0mm · 0.88mm/px · 5 of 76 slices shown (3 of 6)]
[im 1/76]
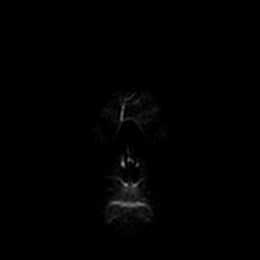
[im 19/76]
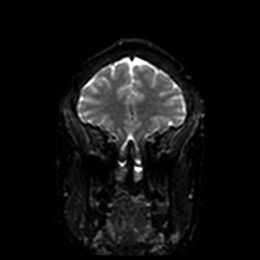
[im 38/76]
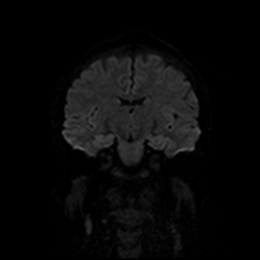
[im 57/76]
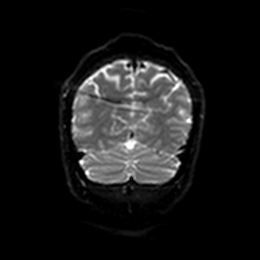
[im 76/76]
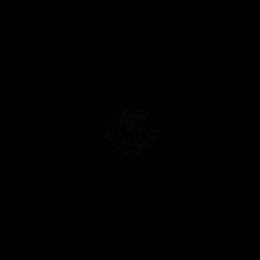

[Series 8: DWI · coronal · 4.0mm · 0.88mm/px · 2 of 38 slices shown (4 of 6)]
[im 1/38]
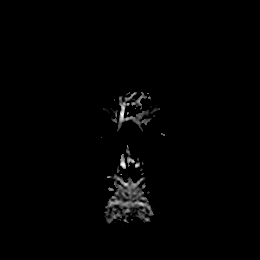
[im 38/38]
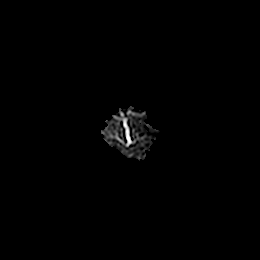

[Series 9: T1 · sagittal · 5.0mm · 0.75mm/px · 2 of 26 slices shown]
[im 1/26]
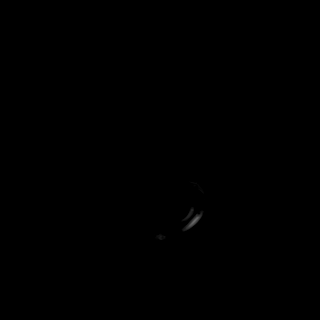
[im 26/26]
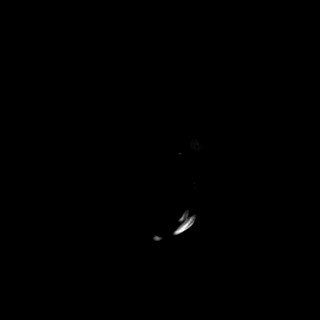

[Series 10: T2 · axial · 5.0mm · 0.72mm/px · z∈[-91,+57]mm · 2 of 27 slices shown (1 of 2)]
[im 1/27]
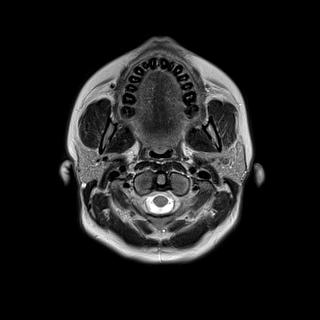
[im 27/27]
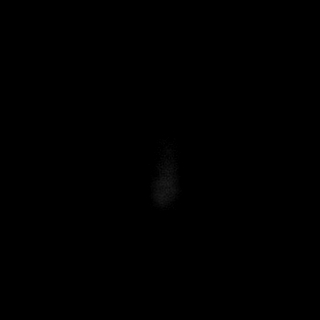

[Series 11: FLAIR · axial · 5.0mm · 0.45mm/px · z∈[-91,+57]mm · 2 of 27 slices shown]
[im 1/27]
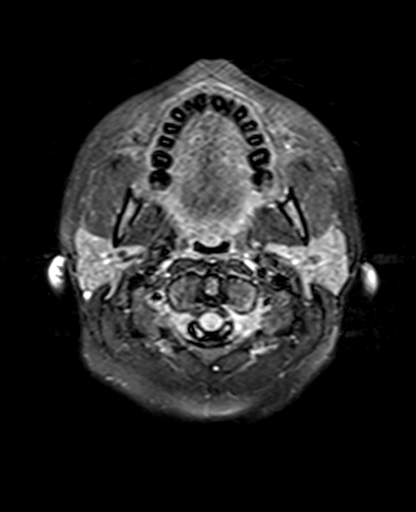
[im 27/27]
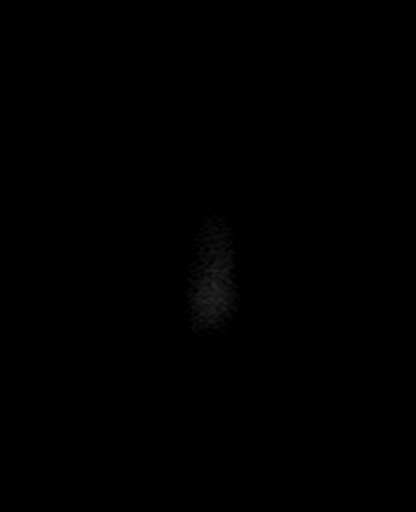

[Series 12: mag_images · axial · 3.0mm · 0.90mm/px · z∈[-90,+55]mm · 3 of 52 slices shown]
[im 1/52]
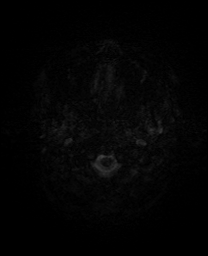
[im 26/52]
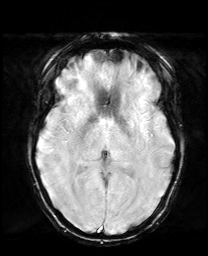
[im 52/52]
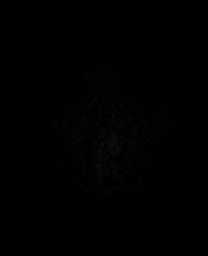

[Series 13: pha_images · axial · 3.0mm · 0.90mm/px · z∈[-90,+50]mm · 3 of 50 slices shown]
[im 1/50]
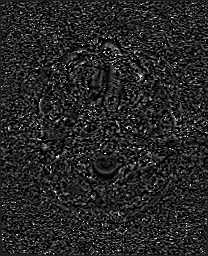
[im 25/50]
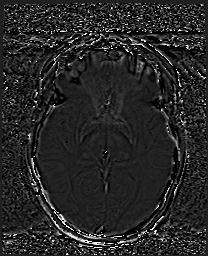
[im 50/50]
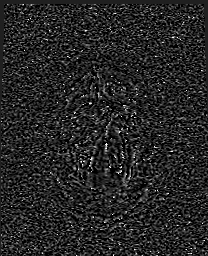

[Series 14: swi_images · axial · 3.0mm · 0.90mm/px · z∈[-90,+55]mm · 3 of 52 slices shown]
[im 1/52]
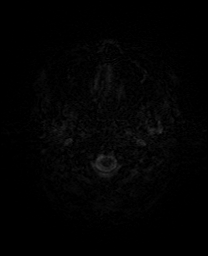
[im 26/52]
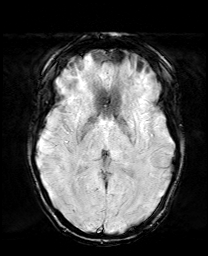
[im 52/52]
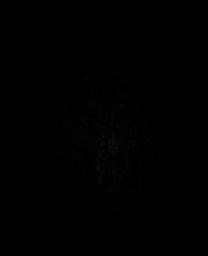

[Series 15: mip_images(sw) · axial · 24.0mm · 0.90mm/px · z∈[-80,+45]mm · 3 of 45 slices shown]
[im 1/45]
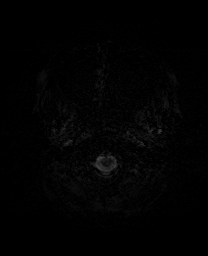
[im 23/45]
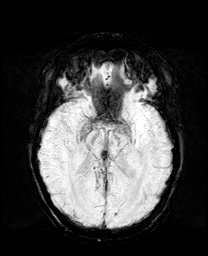
[im 45/45]
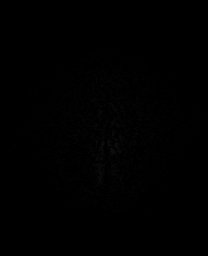

[Series 17: T2 · coronal · 5.0mm · 0.34mm/px · 2 of 32 slices shown (2 of 2)]
[im 1/32]
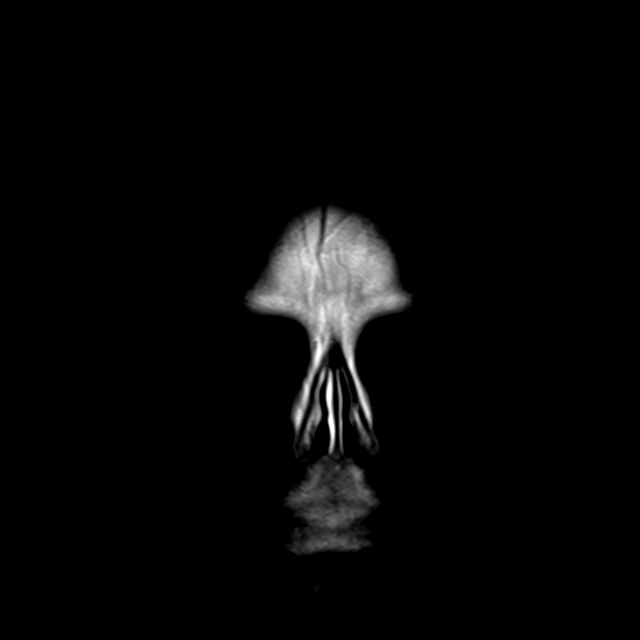
[im 32/32]
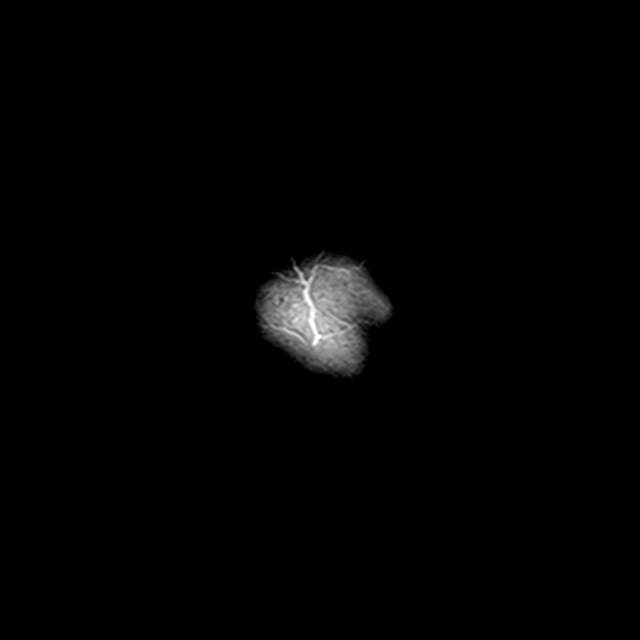

[Series 18: DWI · coronal · 4.0mm · 0.88mm/px · 5 of 76 slices shown (5 of 6)]
[im 1/76]
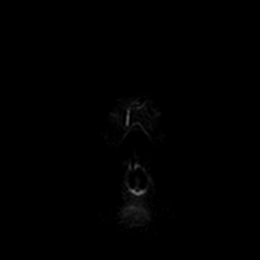
[im 19/76]
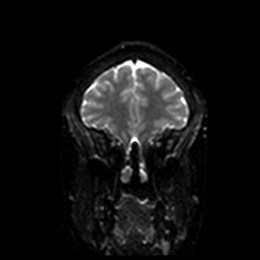
[im 38/76]
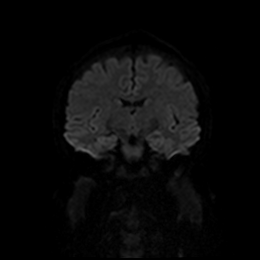
[im 57/76]
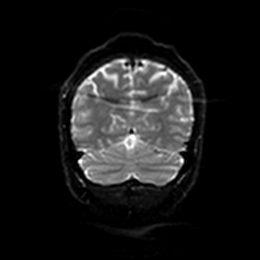
[im 76/76]
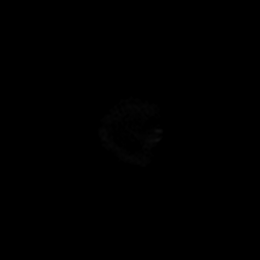

[Series 19: DWI · coronal · 4.0mm · 0.88mm/px · 2 of 38 slices shown (6 of 6)]
[im 1/38]
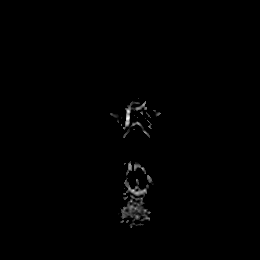
[im 38/38]
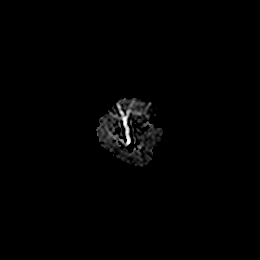

[44 of 48 positions shown; findings below may reference images not displayed]

FINDINGS: Brain: Cerebral volume within normal limits. No focal parenchymal
signal abnormality. No abnormal foci of restricted diffusion to
suggest acute or subacute ischemia. Gray-white matter
differentiation well maintained. No encephalomalacia to suggest
chronic cortical infarction. No foci of susceptibility artifact to
suggest acute or chronic intracranial hemorrhage.

No mass lesion, midline shift or mass effect. Ventricles normal size
without hydrocephalus. No extra-axial fluid collection.

Pituitary gland somewhat prominent with convex border superiorly,
consistent with current pregnancy status. Suprasellar region normal.
Midline structures intact and normal.

Vascular: Major intracranial vascular flow voids are maintained.
Right V4 segment mildly tortuous and partially invaginating on the
adjacent medulla.

Skull and upper cervical spine: Craniocervical junction within
normal limits. Diffusely decreased T1 signal intensity seen within
the visualized bone marrow, likely related to current pregnancy. No
focal marrow replacing lesion. No scalp soft tissue abnormality.

Sinuses/Orbits: Globes and orbital soft tissues within normal
limits. Mild scattered mucosal thickening noted within the ethmoidal
air cells and maxillary sinuses. Paranasal sinuses are otherwise
clear. No mastoid effusion. Inner ear structures grossly normal.

Other: None.
IMPRESSION: Normal brain MRI.  No acute intracranial abnormality identified.

## 2021-07-09 IMAGING — MR MR MRV HEAD W/O CM
2 series · 47 of 48 positions shown · non-contrast
Comparison: No pertinent prior exam.

CLINICAL DATA: Ongoing headache, concern for dural venous sinus
thrombosis

EXAM:
MR VENOGRAM HEAD WITHOUT CONTRAST
TECHNIQUE: Angiographic images of the intracranial venous structures were
acquired using MRV technique without intravenous contrast.

[Series 5: tof_fl2d_paracor · coronal · 2.0mm · 0.98mm/px · 19 of 136 slices shown]
[im 1/136]
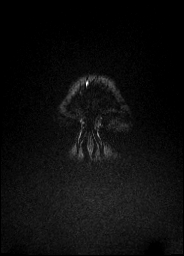
[im 8/136]
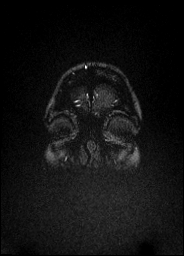
[im 16/136]
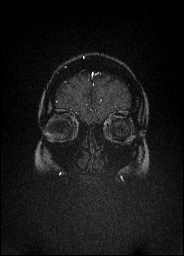
[im 23/136]
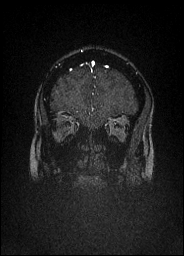
[im 31/136]
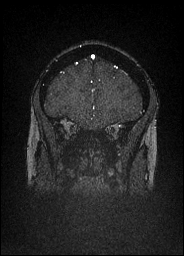
[im 38/136]
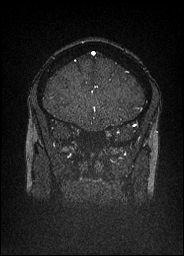
[im 46/136]
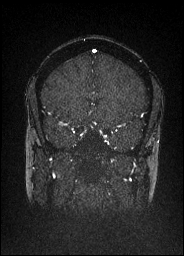
[im 53/136]
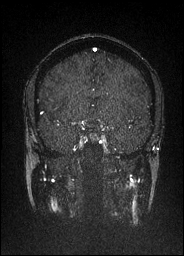
[im 61/136]
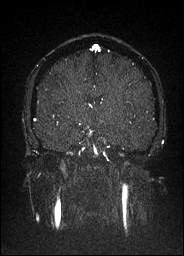
[im 68/136]
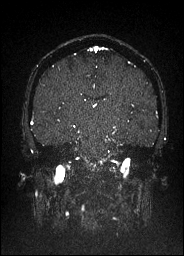
[im 76/136]
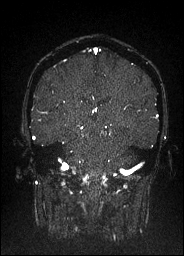
[im 83/136]
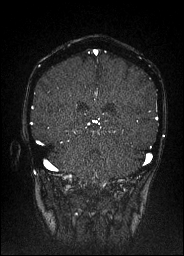
[im 91/136]
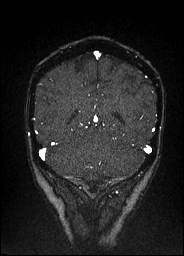
[im 98/136]
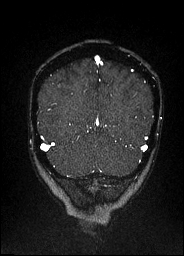
[im 106/136]
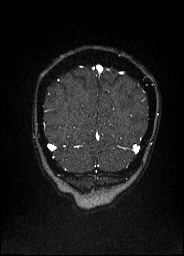
[im 113/136]
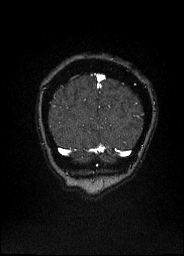
[im 121/136]
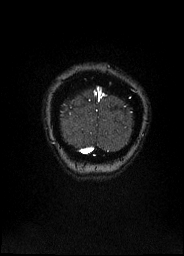
[im 128/136]
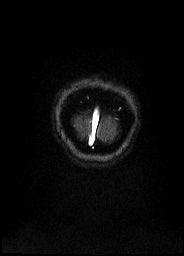
[im 136/136]
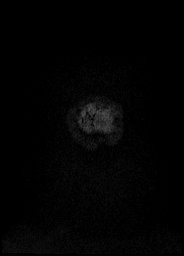

[Series 10: venous inhance coronal_msum · coronal · portal-venous · 0.9mm · 0.57mm/px · 28 of 205 slices shown]
[im 1/205]
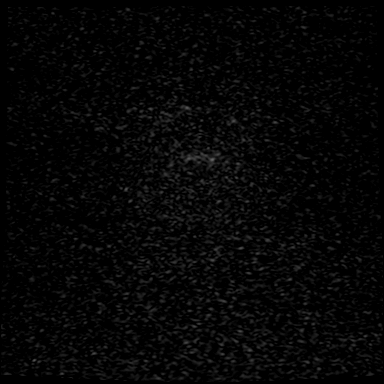
[im 8/205]
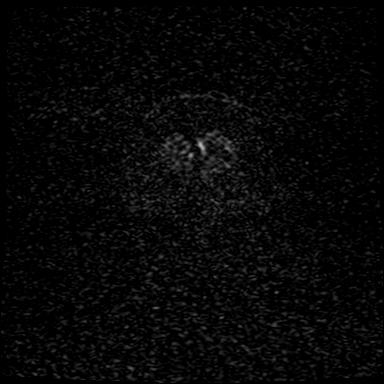
[im 15/205]
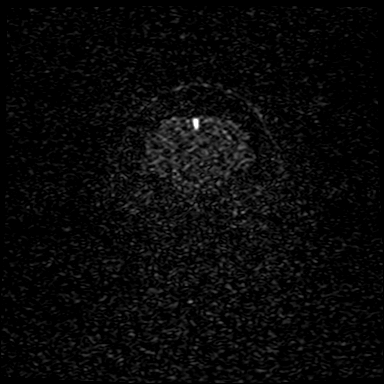
[im 22/205]
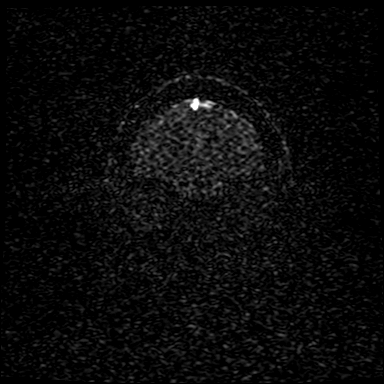
[im 30/205]
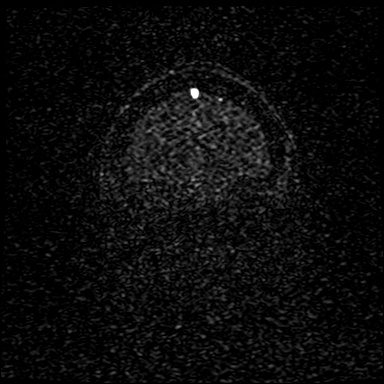
[im 37/205]
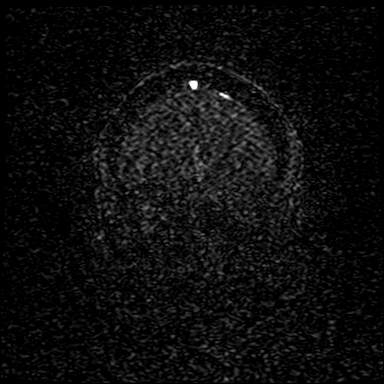
[im 44/205]
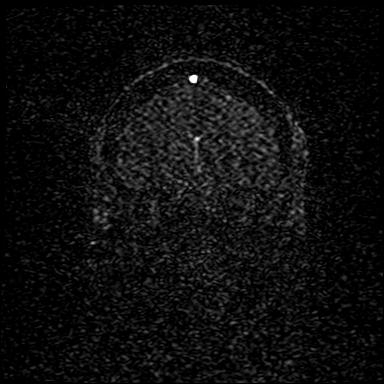
[im 52/205]
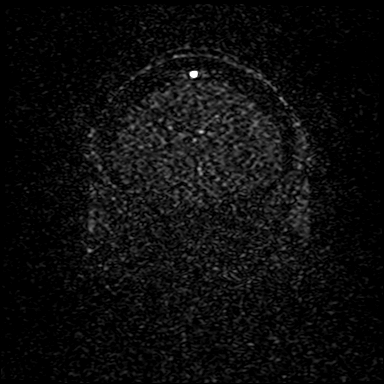
[im 59/205]
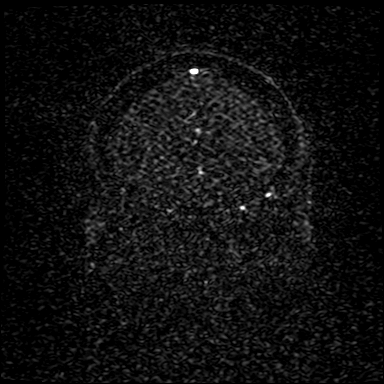
[im 66/205]
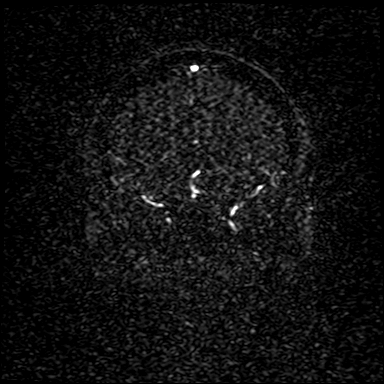
[im 73/205]
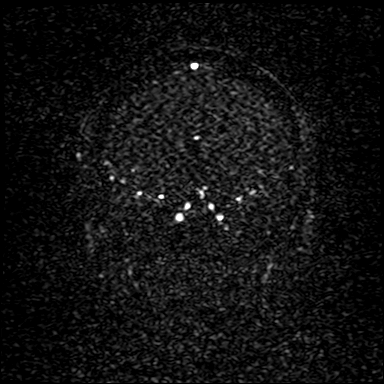
[im 81/205]
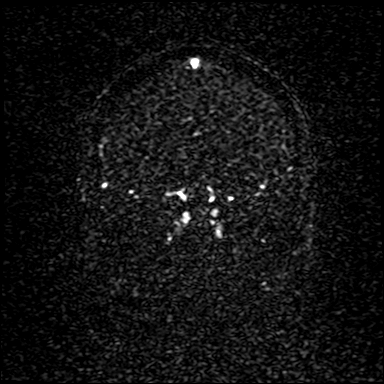
[im 88/205]
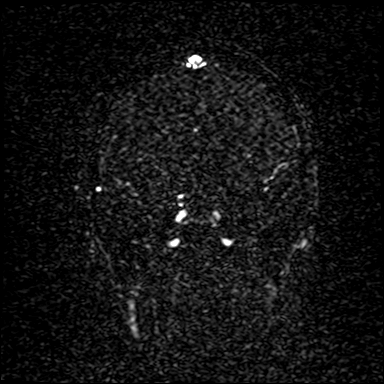
[im 95/205]
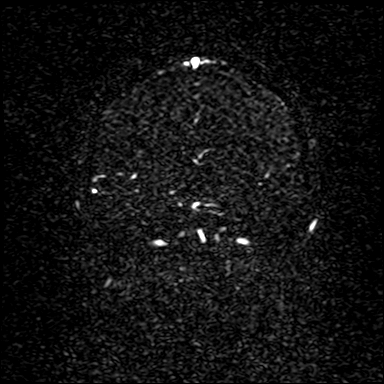
[im 103/205]
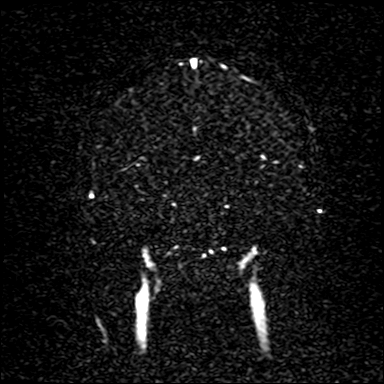
[im 110/205]
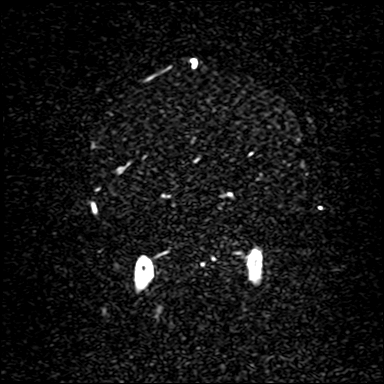
[im 117/205]
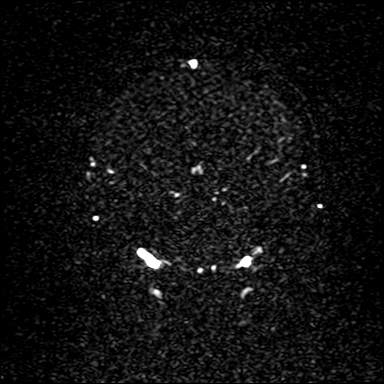
[im 124/205]
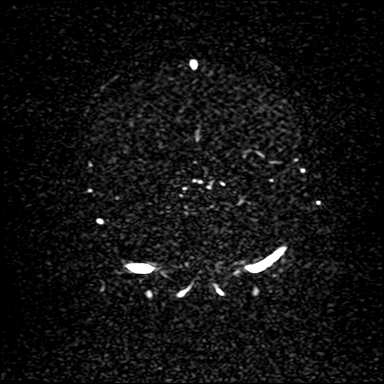
[im 132/205]
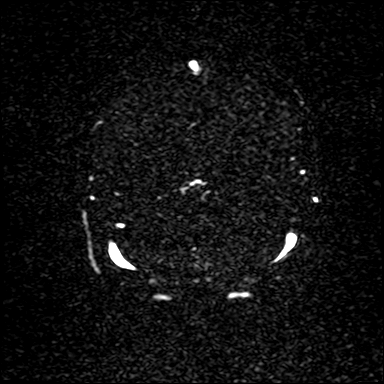
[im 139/205]
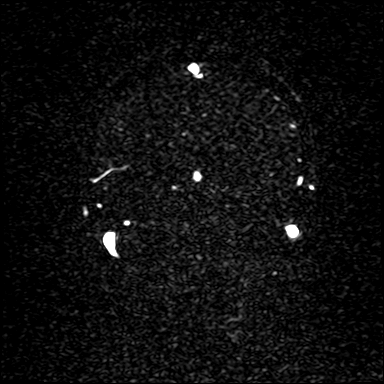
[im 146/205]
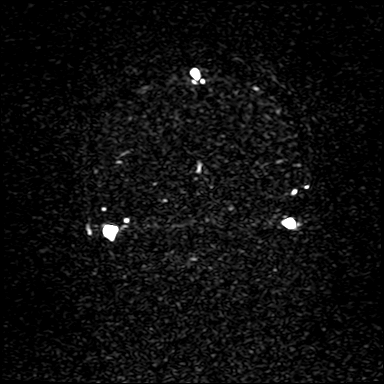
[im 154/205]
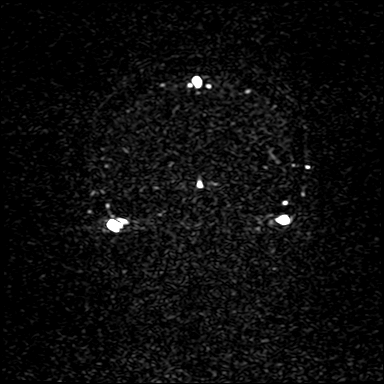
[im 161/205]
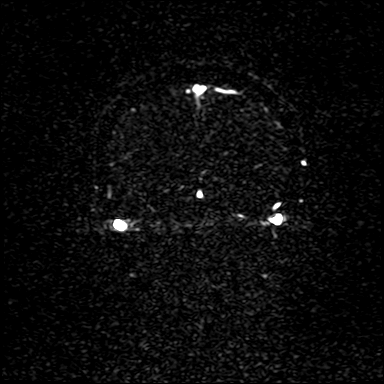
[im 168/205]
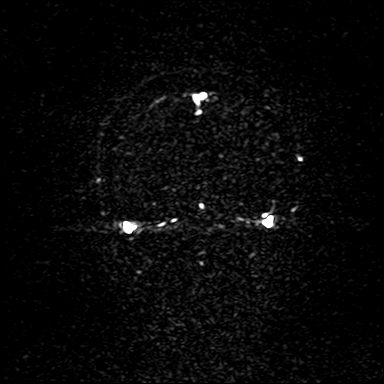
[im 175/205]
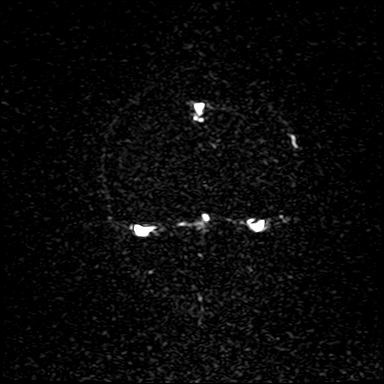
[im 183/205]
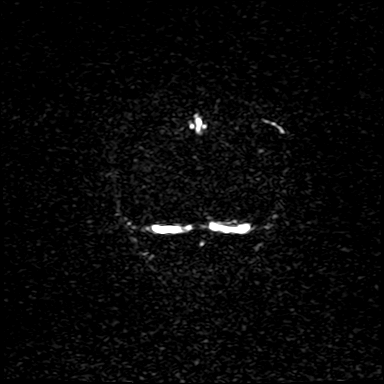
[im 190/205]
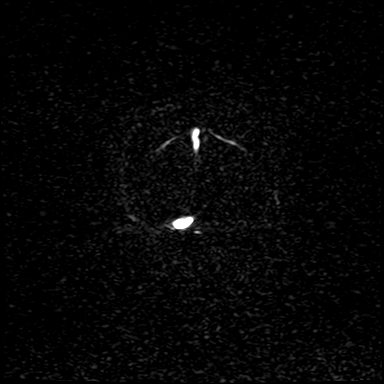
[im 197/205]
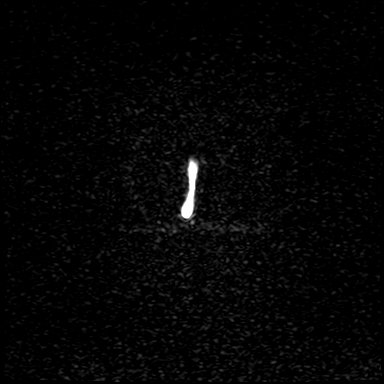

[47 of 48 positions shown; findings below may reference images not displayed]

FINDINGS: There is no evidence of dural venous sinus or deep cerebral vein
thrombosis. No dural venous sinus stenosis.
IMPRESSION: Negative for venous sinus thrombosis.

## 2021-07-09 MED ORDER — MAGNESIUM SULFATE 2 GM/50ML IV SOLN
2.0000 g | Freq: Once | INTRAVENOUS | Status: AC
  Administered 2021-07-09: 2 g via INTRAVENOUS
  Filled 2021-07-09: qty 50

## 2021-07-09 MED ORDER — LACTATED RINGERS IV SOLN
INTRAVENOUS | Status: DC
  Administered 2021-07-12 – 2021-07-13 (×2): 125 mL/h via INTRAVENOUS

## 2021-07-09 MED ORDER — HYDROMORPHONE HCL 1 MG/ML IJ SOLN
1.0000 mg | Freq: Once | INTRAMUSCULAR | Status: AC
  Administered 2021-07-09: 1 mg via INTRAVENOUS
  Filled 2021-07-09: qty 1

## 2021-07-09 MED ORDER — CYCLOBENZAPRINE HCL 5 MG PO TABS
5.0000 mg | ORAL_TABLET | Freq: Once | ORAL | Status: AC
  Administered 2021-07-09: 5 mg via ORAL
  Filled 2021-07-09: qty 1

## 2021-07-09 MED ORDER — METOCLOPRAMIDE HCL 5 MG/ML IJ SOLN
10.0000 mg | Freq: Once | INTRAMUSCULAR | Status: AC
  Administered 2021-07-09: 10 mg via INTRAVENOUS
  Filled 2021-07-09: qty 2

## 2021-07-09 MED ORDER — LACTATED RINGERS IV BOLUS
1000.0000 mL | Freq: Once | INTRAVENOUS | Status: AC
  Administered 2021-07-09: 1000 mL via INTRAVENOUS

## 2021-07-09 MED ORDER — DIPHENHYDRAMINE HCL 50 MG/ML IJ SOLN
12.5000 mg | Freq: Once | INTRAMUSCULAR | Status: AC
  Administered 2021-07-09: 12.5 mg via INTRAVENOUS
  Filled 2021-07-09: qty 1

## 2021-07-09 NOTE — MAU Note (Signed)
Adriana Harrell is a 30 y.o. at [redacted]w[redacted]d here in MAU reporting: ongoing headache since previous MAU visit. Has been taking tylenol, reglan, and fioricet at home with no relief. Also reporting increased vaginal discharge. No bleeding. Also unsure about LOF, has been seeing some clear leaking.  Onset of complaint: ongoing  Pain score: 9/10  Vitals:   07/09/21 1432  BP: 127/64  Pulse: 86  Resp: 16  Temp: 98.4 F (36.9 C)  SpO2: 100%     FHT: +FM, EFM applied in room  Lab orders placed from triage: UA

## 2021-07-09 NOTE — MAU Provider Note (Addendum)
Chief Complaint:  Headache and Vaginal Discharge   Event Date/Time   First Provider Initiated Contact with Patient 07/09/21 1502     HPI: Adriana Harrell is a 30 y.o. [id number] at [redacted]w[redacted]d who presents to maternity admissions reporting ongoing headache. She reports a history of migraines, however has not had one throughout her pregnancy. She reports a headache for about 1 week. Was evaluated last week and was given Reglan and Fioricet. She has been taking regularly which initially helped, however the medications have stopped helping. She reports the headache is all over and behind her eyes. She reports photophobia, nausea, and blurry vision in her right eye. She is also endorsing periods of dizziness that have increased in frequency. She rates headache 9/10.   Pregnancy Course:   Past Medical History:  Diagnosis Date   Migraine    OB History  Gravida Para Term Preterm AB Living  4 2 2   1 2   SAB IAB Ectopic Multiple Live Births  1       2    # Outcome Date GA Lbr Len/2nd Weight Sex Delivery Anes PTL Lv  4 Current           3 Term 05/31/16     CS-Unspec   LIV  2 Term 07/15/14     CS-Unspec   LIV  1 SAB 2013 [redacted]w[redacted]d          Past Surgical History:  Procedure Laterality Date   CESAREAN SECTION     x2   TONSILLECTOMY     History reviewed. No pertinent family history. Social History   Tobacco Use   Smoking status: Former   Smokeless tobacco: Never  Substance Use Topics   Alcohol use: Not Currently   Drug use: Not Currently   Allergies  Allergen Reactions   Penicillins Hives   Tamiflu [Oseltamivir Phosphate] Hives   Medications Prior to Admission  Medication Sig Dispense Refill Last Dose   acetaminophen (TYLENOL) 500 MG tablet Take 1,000 mg by mouth every 6 (six) hours as needed.   07/09/2021 at 0830   albuterol (PROVENTIL HFA;VENTOLIN HFA) 108 (90 Base) MCG/ACT inhaler Inhale 2 puffs into the lungs every 6 (six) hours as needed for wheezing or shortness of breath. 1 Inhaler 2     butalbital-acetaminophen-caffeine (FIORICET) 50-325-40 MG tablet Take 1-2 tablets by mouth every 6 (six) hours as needed for headache or migraine. 20 tablet 0    cyclobenzaprine (FLEXERIL) 10 MG tablet Take 1 tablet (10 mg total) by mouth 2 (two) times daily as needed for muscle spasms. 20 tablet 0    metoCLOPramide (REGLAN) 10 MG tablet Take 1 tablet (10 mg total) by mouth 3 (three) times daily with meals. 90 tablet 1    Prenatal Vit-Fe Fumarate-FA (PRENATAL MULTIVITAMIN) TABS tablet Take 1 tablet by mouth daily at 12 noon.      simethicone (GAS-X) 80 MG chewable tablet Chew 1-2 tablets (80-160 mg total) by mouth every 6 (six) hours as needed for flatulence. 30 tablet 0    I have reviewed patient's Past Medical Hx, Surgical Hx, Family Hx, Social Hx, medications and allergies.   ROS:  Review of Systems  Constitutional: Negative.   HENT: Negative.    Eyes:  Positive for photophobia and visual disturbance (blurry vision in right eye).  Respiratory: Negative.    Cardiovascular: Negative.   Gastrointestinal: Negative.   Genitourinary:  Positive for vaginal discharge.  Musculoskeletal: Negative.   Neurological:  Positive for dizziness and headaches. Negative  for syncope, speech difficulty, weakness and numbness.   Physical Exam  Patient Vitals for the past 24 hrs:  BP Temp Temp src Pulse Resp SpO2  07/09/21 1942 (!) 116/50 -- -- 75 -- --  07/09/21 1646 111/62 -- -- 72 -- 100 %  07/09/21 1631 107/60 -- -- 75 -- --  07/09/21 1615 108/60 -- -- 76 -- --  07/09/21 1600 (!) 107/55 -- -- 76 -- --  07/09/21 1546 (!) 101/43 -- -- 73 -- 100 %  07/09/21 1446 122/82 -- -- 96 -- --  07/09/21 1432 127/64 98.4 F (36.9 C) Oral 86 16 100 %   Constitutional: well-developed, well-nourished female, tearful, no acute distress HEENT: pupils round and reactive to light Cardiovascular: normal rate Respiratory: normal effort GI: abd soft, non-tender, gravid  MS: extremities nontender, no edema, normal  ROM Neurologic: alert and oriented x 4, motor function intact, coordination intact  Psychiatric: behavior normal    FHT: Baseline 125 bpm, moderate variability, 15x15 accelerations present, no decelerations Toco: occasional with ui   Labs: Results for orders placed or performed during the hospital encounter of 07/09/21 (from the past 24 hour(s))  Urinalysis, Routine w reflex microscopic Urine, Clean Catch     Status: Abnormal   Collection Time: 07/09/21  2:52 PM  Result Value Ref Range   Color, Urine YELLOW YELLOW   APPearance CLEAR CLEAR   Specific Gravity, Urine <1.005 (L) 1.005 - 1.030   pH 7.0 5.0 - 8.0   Glucose, UA NEGATIVE NEGATIVE mg/dL   Hgb urine dipstick NEGATIVE NEGATIVE   Bilirubin Urine NEGATIVE NEGATIVE   Ketones, ur NEGATIVE NEGATIVE mg/dL   Protein, ur NEGATIVE NEGATIVE mg/dL   Nitrite NEGATIVE NEGATIVE   Leukocytes,Ua NEGATIVE NEGATIVE  Protein / creatinine ratio, urine     Status: None   Collection Time: 07/09/21  3:11 PM  Result Value Ref Range   Creatinine, Urine 29.14 mg/dL   Total Protein, Urine <6 mg/dL   Protein Creatinine Ratio        0.00 - 0.15 mg/mg[Cre]  CBC     Status: Abnormal   Collection Time: 07/09/21  3:39 PM  Result Value Ref Range   WBC 12.5 (H) 4.0 - 10.5 K/uL   RBC 3.64 (L) 3.87 - 5.11 MIL/uL   Hemoglobin 10.2 (L) 12.0 - 15.0 g/dL   HCT 24.5 (L) 80.9 - 98.3 %   MCV 85.4 80.0 - 100.0 fL   MCH 28.0 26.0 - 34.0 pg   MCHC 32.8 30.0 - 36.0 g/dL   RDW 38.2 50.5 - 39.7 %   Platelets 255 150 - 400 K/uL   nRBC 0.0 0.0 - 0.2 %  Comprehensive metabolic panel     Status: Abnormal   Collection Time: 07/09/21  3:39 PM  Result Value Ref Range   Sodium 136 135 - 145 mmol/L   Potassium 3.4 (L) 3.5 - 5.1 mmol/L   Chloride 106 98 - 111 mmol/L   CO2 23 22 - 32 mmol/L   Glucose, Bld 81 70 - 99 mg/dL   BUN <5 (L) 6 - 20 mg/dL   Creatinine, Ser 6.73 0.44 - 1.00 mg/dL   Calcium 8.4 (L) 8.9 - 10.3 mg/dL   Total Protein 6.0 (L) 6.5 - 8.1 g/dL    Albumin 2.5 (L) 3.5 - 5.0 g/dL   AST 34 15 - 41 U/L   ALT 30 0 - 44 U/L   Alkaline Phosphatase 113 38 - 126 U/L   Total Bilirubin 0.3 0.3 -  1.2 mg/dL   GFR, Estimated >51 >88 mL/min   Anion gap 7 5 - 15    Imaging:  MR MRV HEAD WO CM  Result Date: 07/09/2021 CLINICAL DATA:  Ongoing headache, concern for dural venous sinus thrombosis EXAM: MR VENOGRAM HEAD WITHOUT CONTRAST TECHNIQUE: Angiographic images of the intracranial venous structures were acquired using MRV technique without intravenous contrast. COMPARISON:  No pertinent prior exam. FINDINGS: There is no evidence of dural venous sinus or deep cerebral vein thrombosis. No dural venous sinus stenosis. IMPRESSION: Negative for venous sinus thrombosis. Electronically Signed   By: Wiliam Ke M.D.   On: 07/09/2021 19:06     MAU Course: Orders Placed This Encounter  Procedures   MR MRV HEAD WO CM   MR BRAIN WO CONTRAST   Urinalysis, Routine w reflex microscopic Urine, Clean Catch   CBC   Comprehensive metabolic panel   Protein / creatinine ratio, urine   Meds ordered this encounter  Medications   lactated ringers bolus 1,000 mL   metoCLOPramide (REGLAN) injection 10 mg   diphenhydrAMINE (BENADRYL) injection 12.5 mg   cyclobenzaprine (FLEXERIL) tablet 5 mg   magnesium sulfate IVPB 2 g 50 mL   lactated ringers infusion    MDM: CBC, CMP, UPCR unremarkable Serial BP's wnl NST reactive and reassuring for gestational age IVF bolus and IV benadryl and reglan without improvement in headache 5mg  Flexeril PO and 2g magnesium IVPB given MRI/MRV ordered/pending Care handed over to , CNM at 2200    Wynelle Bourgeois, MSN, CNM 07/09/2021 9:53 PM  Assumed care at 2200hrs: Went for MRI/MRV but they only did the venogram, So we had to call them back to take her for the MRI  These resulted as normal   Dilaudid given with no relief Zofran given for nausea  Consulted Neurology (Dr 09/08/2021), who reviewed results and  treatment so far. He recommended we give a high dose of Solumedrol 500mg  After reassessment, she was not any better She does endorse that she has worsening pain when bending forward to put on socks, and gets dizzy if she lies flat.   Reconsulted Dr Wilford Corner with report of lack of improvement  He recommends transfer to ED for Lumbar Puncture with opening pressures He will then consult with Dr (accepting physician)  Wilford Corner, CNM

## 2021-07-10 DIAGNOSIS — R519 Headache, unspecified: Secondary | ICD-10-CM | POA: Diagnosis not present

## 2021-07-10 DIAGNOSIS — Z3480 Encounter for supervision of other normal pregnancy, unspecified trimester: Secondary | ICD-10-CM

## 2021-07-10 DIAGNOSIS — G932 Benign intracranial hypertension: Secondary | ICD-10-CM | POA: Diagnosis present

## 2021-07-10 LAB — RESP PANEL BY RT-PCR (FLU A&B, COVID) ARPGX2
Influenza A by PCR: NEGATIVE
Influenza B by PCR: NEGATIVE
SARS Coronavirus 2 by RT PCR: NEGATIVE

## 2021-07-10 LAB — CSF CELL COUNT WITH DIFFERENTIAL
RBC Count, CSF: 0 /mm3
RBC Count, CSF: 0 /mm3
Tube #: 4
Tube #: 3
WBC, CSF: 1 /mm3 (ref 0–5)
WBC, CSF: 1 /mm3 (ref 0–5)

## 2021-07-10 LAB — TYPE AND SCREEN
ABO/RH(D): O NEG
Antibody Screen: POSITIVE

## 2021-07-10 LAB — PROTEIN AND GLUCOSE, CSF
Glucose, CSF: 57 mg/dL (ref 40–70)
Total  Protein, CSF: 21 mg/dL (ref 15–45)

## 2021-07-10 MED ORDER — PRENATAL MULTIVITAMIN CH
1.0000 | ORAL_TABLET | Freq: Every day | ORAL | Status: DC
  Administered 2021-07-12: 1 via ORAL
  Filled 2021-07-10 (×2): qty 1

## 2021-07-10 MED ORDER — ACETAMINOPHEN 500 MG PO TABS
1000.0000 mg | ORAL_TABLET | Freq: Four times a day (QID) | ORAL | Status: DC
  Administered 2021-07-10 – 2021-07-18 (×28): 1000 mg via ORAL
  Filled 2021-07-10 (×32): qty 2

## 2021-07-10 MED ORDER — ACETAMINOPHEN 325 MG PO TABS
650.0000 mg | ORAL_TABLET | Freq: Once | ORAL | Status: AC
  Administered 2021-07-10: 650 mg via ORAL
  Filled 2021-07-10: qty 2

## 2021-07-10 MED ORDER — OXYCODONE HCL 5 MG PO TABS
5.0000 mg | ORAL_TABLET | ORAL | Status: DC | PRN
Start: 2021-07-10 — End: 2021-07-16
  Administered 2021-07-10 – 2021-07-16 (×24): 5 mg via ORAL
  Filled 2021-07-10 (×26): qty 1

## 2021-07-10 MED ORDER — ACETAZOLAMIDE 250 MG PO TABS
500.0000 mg | ORAL_TABLET | Freq: Once | ORAL | Status: DC
  Administered 2021-07-10: 500 mg via ORAL
  Filled 2021-07-10: qty 2

## 2021-07-10 MED ORDER — METOCLOPRAMIDE HCL 5 MG/5ML PO SOLN
10.0000 mg | Freq: Four times a day (QID) | ORAL | Status: DC | PRN
  Administered 2021-07-10 – 2021-07-11 (×2): 10 mg via ORAL
  Filled 2021-07-10 (×2): qty 10

## 2021-07-10 MED ORDER — HYDROMORPHONE HCL 1 MG/ML IJ SOLN
1.0000 mg | Freq: Once | INTRAMUSCULAR | Status: AC
  Administered 2021-07-10: 1 mg via INTRAVENOUS
  Filled 2021-07-10: qty 1

## 2021-07-10 MED ORDER — METHYLPREDNISOLONE SODIUM SUCC 125 MG IJ SOLR
500.0000 mg | Freq: Once | INTRAMUSCULAR | Status: AC
  Administered 2021-07-10: 500 mg via INTRAVENOUS
  Filled 2021-07-10: qty 8

## 2021-07-10 MED ORDER — PRENATAL MULTIVITAMIN CH
1.0000 | ORAL_TABLET | Freq: Every day | ORAL | Status: DC
  Administered 2021-07-11 – 2021-07-17 (×5): 1 via ORAL
  Filled 2021-07-10 (×5): qty 1

## 2021-07-10 MED ORDER — DOCUSATE SODIUM 100 MG PO CAPS
100.0000 mg | ORAL_CAPSULE | Freq: Every day | ORAL | Status: DC
  Administered 2021-07-11 – 2021-07-18 (×8): 100 mg via ORAL
  Filled 2021-07-10 (×8): qty 1

## 2021-07-10 MED ORDER — ONDANSETRON HCL 4 MG/2ML IJ SOLN
4.0000 mg | Freq: Once | INTRAMUSCULAR | Status: AC
  Administered 2021-07-10: 4 mg via INTRAVENOUS
  Filled 2021-07-10: qty 2

## 2021-07-10 MED ORDER — ACETAZOLAMIDE ER 500 MG PO CP12
500.0000 mg | ORAL_CAPSULE | Freq: Two times a day (BID) | ORAL | Status: DC
  Administered 2021-07-10 – 2021-07-12 (×4): 500 mg via ORAL
  Filled 2021-07-10 (×6): qty 1

## 2021-07-10 MED ORDER — BUTALBITAL-APAP-CAFFEINE 50-325-40 MG PO TABS
1.0000 | ORAL_TABLET | Freq: Four times a day (QID) | ORAL | Status: DC | PRN
Start: 2021-07-10 — End: 2021-07-13
  Administered 2021-07-10: 1 via ORAL
  Administered 2021-07-12 – 2021-07-13 (×2): 2 via ORAL
  Filled 2021-07-10 (×3): qty 2
  Filled 2021-07-10: qty 1

## 2021-07-10 MED ORDER — LIDOCAINE HCL (PF) 1 % IJ SOLN
INTRAMUSCULAR | Status: AC
  Filled 2021-07-10: qty 30

## 2021-07-10 MED ORDER — HYDROMORPHONE HCL 1 MG/ML IJ SOLN
1.0000 mg | INTRAMUSCULAR | Status: DC | PRN
Start: 2021-07-10 — End: 2021-07-18
  Administered 2021-07-10 – 2021-07-18 (×46): 1 mg via INTRAVENOUS
  Filled 2021-07-10 (×50): qty 1

## 2021-07-10 MED ORDER — CALCIUM CARBONATE ANTACID 500 MG PO CHEW
2.0000 | CHEWABLE_TABLET | ORAL | Status: DC | PRN
  Administered 2021-07-16: 400 mg via ORAL
  Filled 2021-07-10: qty 2

## 2021-07-10 MED ORDER — DEXAMETHASONE SODIUM PHOSPHATE 10 MG/ML IJ SOLN: 10.0000 mg | Freq: Once | INTRAMUSCULAR | Status: DC

## 2021-07-10 MED ORDER — ACETAMINOPHEN 325 MG PO TABS
325.0000 mg | ORAL_TABLET | Freq: Once | ORAL | Status: DC
  Filled 2021-07-10: qty 1

## 2021-07-10 MED ORDER — ACETAZOLAMIDE 250 MG PO TABS: 500.0000 mg | ORAL_TABLET | Freq: Two times a day (BID) | ORAL | 0 refills | Status: DC

## 2021-07-10 MED ORDER — SODIUM CHLORIDE 0.9 % IV SOLN
12.5000 mg | Freq: Once | INTRAVENOUS | Status: AC
  Administered 2021-07-10: 12.5 mg via INTRAVENOUS
  Filled 2021-07-10: qty 0.5

## 2021-07-10 MED ORDER — ZOLPIDEM TARTRATE 5 MG PO TABS
5.0000 mg | ORAL_TABLET | Freq: Every evening | ORAL | Status: DC | PRN
  Administered 2021-07-10 – 2021-07-14 (×2): 5 mg via ORAL
  Filled 2021-07-10 (×2): qty 1

## 2021-07-10 NOTE — Plan of Care (Signed)
Literature search on acetazolamide's (pregnancy category C) potential teratogenicity was performed.   The two major therapeutic goals of acetazolamide in pregnant patients with pseudotumor cerebri (also referred to as idiopathic intracranial hypertension, or IIH) are to alleviate pain and to prevent vision loss because severe loss of vision develops in 10% of pregnant women with IIH. Acetazolamide acts by inhibiting carbonic anhydrase, an enzyme that catalyzes (reversibly) hydration of carbon dioxide, and dehydration of bicarbonate. Maternal administration of acetazolamide in rodents results in a highly specific limb malformation in the offspring. Metabolic and respiratory acidosis have been linked to acetazolamide-induced ectrodactyly. Acetazolamide exerts its teratogenic action through induction of an acidotic embryonic environment. Additionally, maternal acetazolamide treatment suppresses fetal weight, as reported in many animal studies and was associated with a birth weight below the 5th percentile in a human patient in one of the case reports surveyed in the medical literature.  According to the [place] Food and Drug Administration, acetazolamide is classified as a class C drug. This indicates that animal reproduction studies have shown an adverse effect of acetazolamide on the fetus, but there are no well-controlled studies in humans. As a class C drug, acetazolamide should be prescribed only if the potential benefit justifies the potential risk to the fetus, according to [place] Food and Drug Administration recommendations.  In humans, sacrococcygeal teratoma, metabolic acidosis, hypocalcemia, and hypomagnesemia have been reported in newborns born to mothers under acetazolamide treatment. However, these reported cases showed little clinical evidence to support any adverse effect of this drug on pregnancy outcomes in humans. For example, in one study, the risk of spontaneous abortion was similar in the treated and  control groups, and there were no major complications in the newborns of women who were treated with acetazolamide. Although these studies do not prove that acetazolamide is safe during pregnancy, and negative data do not exclude the teratogenic possibility, they proposed that birth defects from acetazolamide, if they occur, are rare. Additionally, they suggested if the clinical situation mandates the use of acetazolamide in pregnant women with IIH, then this drug can probably be offered after proper informed consent.  However, these previous studies reported that the follow-up interval for their cases and those in the literature was relatively short, and late effects may have been missed. For example, one potential delayed teratogenic effect, oligodontia of the permanent teeth, may not be discovered until the patient is > 59 years old, per a case report. Oligodontia of permanent dentition might be considered as one of the late effects of acetazolamide that could be missed. Although tooth agenesis has not been reported, even in animal studies, one researcher has postulated that maternal acetazolamide treatment causes retardation of incisor teeth development, which is partially independent of suppression of fetal weight.   Based on the above, would hold any further acetazolamide doses until in-depth discussion with the patient has been undertaken to determine if she is willing to take this medication from a carefully balanced risk benefit standpoint, both for her and for her developing fetus.   Electronically signed: Dr. Caryl Pina

## 2021-07-10 NOTE — ED Provider Notes (Signed)
[facility] EMERGENCY DEPARTMENT Provider Note   CSN: [id number] Arrival date & time: 07/09/21  1410     History Chief Complaint  Patient presents with   Headache   Vaginal Discharge    Adriana Harrell is a 30 y.o. female.  The history is provided by the patient and medical records.  Headache Vaginal Discharge She is currently pregnant [redacted] weeks 3days and was transferred here from maternal admissions unit for lumbar puncture.  She is G4 P2-0-12.  She has been having a global headache for about the last week.  There was an ED visit on 9/2 for headache and she was treated with oral caffeine, intramuscular metoclopramide, oral ibuprofen, oral Fioricet with significant improvement in headache.  However, headache has recurred.  She states it is throbbing and she rates it at 8/10.  It is worse when supine, worse when she leans over, better if she is sitting up.  She denies any visual change, nausea, vomiting, weakness, numbness.  She returned tonight to the maternal admissions unit where MRI of the brain, MRV of the head were ordered which were negative for acute process.  They consulted with neurology who recommended she be transferred here for lumbar puncture to evaluate for idiopathic intracranial hypertension.   Past Medical History:  Diagnosis Date   Migraine     There are no problems to display for this patient.   Past Surgical History:  Procedure Laterality Date   CESAREAN SECTION     x2   TONSILLECTOMY       OB History     Gravida  4   Para  2   Term  2   Preterm      AB  1   Living  2      SAB  1   IAB      Ectopic      Multiple      Live Births  2           History reviewed. No pertinent family history.  Social History   Tobacco Use   Smoking status: Former   Smokeless tobacco: Never  Substance Use Topics   Alcohol use: Not Currently   Drug use: Not Currently    Home Medications Prior to Admission medications    Medication Sig Start Date End Date Taking? Authorizing Provider  acetaminophen (TYLENOL) 500 MG tablet Take 1,000 mg by mouth every 6 (six) hours as needed.   Yes [provider]  albuterol (PROVENTIL HFA;VENTOLIN HFA) 108 (90 Base) MCG/ACT inhaler Inhale 2 puffs into the lungs every 6 (six) hours as needed for wheezing or shortness of breath. 09/24/18   Willy Eddy, MD  butalbital-acetaminophen-caffeine (FIORICET) [phone] MG tablet Take 1-2 tablets by mouth every 6 (six) hours as needed for headache or migraine. 07/03/21 07/03/22  Rasch, Victorino Dike I, NP  cyclobenzaprine (FLEXERIL) 10 MG tablet Take 1 tablet (10 mg total) by mouth 2 (two) times daily as needed for muscle spasms. 06/10/21   Leftwich-Kirby, Wilmer Floor, CNM  metoCLOPramide (REGLAN) 10 MG tablet Take 1 tablet (10 mg total) by mouth 3 (three) times daily with meals. 07/03/21 07/03/22  Rasch, Harolyn Rutherford, NP  Prenatal Vit-Fe Fumarate-FA (PRENATAL MULTIVITAMIN) TABS tablet Take 1 tablet by mouth daily at 12 noon.    [provider]  simethicone (GAS-X) 80 MG chewable tablet Chew 1-2 tablets (80-160 mg total) by mouth every 6 (six) hours as needed for flatulence. 05/19/21   Leftwich-Kirby, Wilmer Floor, CNM  Allergies    Penicillins and Tamiflu [oseltamivir phosphate]  Review of Systems   Review of Systems  Genitourinary:  Positive for vaginal discharge.  Neurological:  Positive for headaches.  All other systems reviewed and are negative.  Physical Exam Updated Vital Signs BP (!) 108/59   Pulse 76   Temp 98.4 F (36.9 C) (Oral)   Resp 16   LMP 12/11/2020 (Exact Date)   SpO2 100%   Physical Exam Vitals and nursing note reviewed.  30 year old female, resting comfortably and in no acute distress. Vital signs are normal. Oxygen saturation is 100%, which is normal. Head is normocephalic and atraumatic. PERRLA, EOMI. Oropharynx is clear.  Fundi show no hemorrhage, exudate, or papilledema. Neck is nontender and supple  without adenopathy or JVD. Back is nontender and there is no CVA tenderness. Lungs are clear without rales, wheezes, or rhonchi. Chest is nontender. Heart has regular rate and rhythm without murmur. Abdomen is soft, flat, nontender.  Gravid uterus present, size consistent with dates.  There are no other masses or hepatosplenomegaly and peristalsis is normoactive. Extremities have no cyanosis or edema, full range of motion is present. Skin is warm and dry without rash. Neurologic: Mental status is normal, cranial nerves are intact, there are no motor or sensory deficits.  ED Results / Procedures / Treatments   Labs (all labs ordered are listed, but only abnormal results are displayed) Labs Reviewed  URINALYSIS, ROUTINE W REFLEX MICROSCOPIC - Abnormal; Notable for the following components:      Result Value   Specific Gravity, Urine <1.005 (*)    All other components within normal limits  CBC - Abnormal; Notable for the following components:   WBC 12.5 (*)    RBC 3.64 (*)    Hemoglobin 10.2 (*)    HCT 31.1 (*)    All other components within normal limits  COMPREHENSIVE METABOLIC PANEL - Abnormal; Notable for the following components:   Potassium 3.4 (*)    BUN <5 (*)    Calcium 8.4 (*)    Total Protein 6.0 (*)    Albumin 2.5 (*)    All other components within normal limits  CSF CULTURE W GRAM STAIN  PROTEIN / CREATININE RATIO, URINE  CSF CELL COUNT WITH DIFFERENTIAL  PROTEIN AND GLUCOSE, CSF  CSF CELL COUNT WITH DIFFERENTIAL    Radiology MR BRAIN WO CONTRAST  Result Date: 07/09/2021 CLINICAL DATA:  Initial evaluation for acute headache trauma currently pregnant. EXAM: MRI HEAD WITHOUT CONTRAST TECHNIQUE: Multiplanar, multiecho pulse sequences of the brain and surrounding structures were obtained without intravenous contrast. COMPARISON:  Comparison made with prior MRV performed earlier the same day. FINDINGS: Brain: Cerebral volume within normal limits. No focal parenchymal  signal abnormality. No abnormal foci of restricted diffusion to suggest acute or subacute ischemia. Gray-white matter differentiation well maintained. No encephalomalacia to suggest chronic cortical infarction. No foci of susceptibility artifact to suggest acute or chronic intracranial hemorrhage. No mass lesion, midline shift or mass effect. Ventricles normal size without hydrocephalus. No extra-axial fluid collection. Pituitary gland somewhat prominent with convex border superiorly, consistent with current pregnancy status. Suprasellar region normal. Midline structures intact and normal. Vascular: Major intracranial vascular flow voids are maintained. Right V4 segment mildly tortuous and partially invaginating on the adjacent medulla. Skull and upper cervical spine: Craniocervical junction within normal limits. Diffusely decreased T1 signal intensity seen within the visualized bone marrow, likely related to current pregnancy. No focal marrow replacing lesion. No scalp soft tissue abnormality.  Sinuses/Orbits: Globes and orbital soft tissues within normal limits. Mild scattered mucosal thickening noted within the ethmoidal air cells and maxillary sinuses. Paranasal sinuses are otherwise clear. No mastoid effusion. Inner ear structures grossly normal. Other: None. IMPRESSION: Normal brain MRI.  No acute intracranial abnormality identified. Electronically Signed   By: Rise Mu M.D.   On: 07/09/2021 23:52   MR MRV HEAD WO CM  Result Date: 07/09/2021 CLINICAL DATA:  Ongoing headache, concern for dural venous sinus thrombosis EXAM: MR VENOGRAM HEAD WITHOUT CONTRAST TECHNIQUE: Angiographic images of the intracranial venous structures were acquired using MRV technique without intravenous contrast. COMPARISON:  No pertinent prior exam. FINDINGS: There is no evidence of dural venous sinus or deep cerebral vein thrombosis. No dural venous sinus stenosis. IMPRESSION: Negative for venous sinus thrombosis.  Electronically Signed   By: Wiliam Ke M.D.   On: 07/09/2021 19:06    Procedures .Lumbar Puncture  Date/Time: 07/10/2021 6:33 AM Performed by: Dione Booze, MD Authorized by: Dione Booze, MD   Consent:    Consent obtained:  Verbal   Consent given by:  Patient   Risks, benefits, and alternatives were discussed: yes     Risks discussed:  Infection, headache and pain   Alternatives discussed:  No treatment Universal protocol:    Procedure explained and questions answered to patient or proxy's satisfaction: yes     Relevant documents present and verified: yes     Test results available: yes     Imaging studies available: yes     Required blood products, implants, devices, and special equipment available: yes     Immediately prior to procedure a time out was called: yes     Site/side marked: yes     Patient identity confirmed:  Verbally with patient and arm band Pre-procedure details:    Procedure purpose:  Diagnostic   Preparation: Patient was prepped and draped in usual sterile fashion   Anesthesia:    Anesthesia method:  Local infiltration   Local anesthetic:  Lidocaine 1% w/o epi Procedure details:    Lumbar space:  L3-L4 interspace   Patient position:  L lateral decubitus   Needle gauge:  20   Needle type:  Spinal needle - Quincke tip   Needle length (in):  3.5   Ultrasound guidance: no     Number of attempts:  1   Opening pressure (cm H2O):  24   Closing pressure (cm H2O):  15   Fluid appearance:  Clear   Tubes of fluid:  4   Total volume (ml):  15 Post-procedure details:    Puncture site:  Direct pressure applied and adhesive bandage applied   Procedure completion:  Tolerated well, no immediate complications   Medications Ordered in ED Medications  lactated ringers infusion ( Intravenous New Bag/Given 07/09/21 2112)  lactated ringers bolus 1,000 mL (0 mLs Intravenous Stopped 07/09/21 1634)  metoCLOPramide (REGLAN) injection 10 mg (10 mg Intravenous Given 07/09/21  1537)  diphenhydrAMINE (BENADRYL) injection 12.5 mg (12.5 mg Intravenous Given 07/09/21 1534)  cyclobenzaprine (FLEXERIL) tablet 5 mg (5 mg Oral Given 07/09/21 2110)  magnesium sulfate IVPB 2 g 50 mL (2 g Intravenous New Bag/Given 07/09/21 2124)  HYDROmorphone (DILAUDID) injection 1 mg (1 mg Intravenous Given 07/09/21 2325)  ondansetron (ZOFRAN) injection 4 mg (4 mg Intravenous Given 07/10/21 0030)  methylPREDNISolone sodium succinate (SOLU-MEDROL) 125 mg/2 mL injection 500 mg (500 mg Intravenous Given 07/10/21 0140)  promethazine (PHENERGAN) 12.5 mg in sodium chloride 0.9 % 50 mL IVPB (12.5  mg Intravenous New Bag/Given 07/10/21 0402)    ED Course  I have reviewed the triage vital signs and the nursing notes.  Pertinent labs & imaging results that were available during my care of the patient were reviewed by me and considered in my medical decision making (see chart for details).  Clinical Course as of 07/10/21 0729  Fri Jul 10, 2021  0701 Pending CSF studies [MK]    Clinical Course User Index [MK] Kommor, Wyn ForsterMadison, MD   MDM Rules/Calculators/A&P                         Headache in second trimester pregnancy.  Old records reviewed with above-noted testing and treatment for headache.  She also was given intravenous lactated Ringer's, metoclopramide, diphenhydramine, magnesium sulfate as well as oral cyclobenzaprine without relief of headache.  She was then given intravenous hydromorphone without relief of headache.  Plan to proceed with lumbar puncture.  Lumbar puncture she has clear fluid with elevated initial pressure.  After 8 mL of fluid was obtained for diagnostic testing, pressure was still mildly elevated at 19.  An estimated 7 mL of additional fluid was allowed to drain off and closing pressure was 15.  Findings were discussed with Dr. Wilford CornerArora of neurology service who will set this is sufficient to make a diagnosis of idiopathic intracranial hypertension and recommends starting her on acetazolamide  500 mg twice daily.  She will be referred to ophthalmology and neurology.  CSF cell count, glucose, protein are pending at this time.  Headache is not any different following lumbar puncture.  She is given another dose of hydromorphone.  CSF results are pending.  Case is signed out to Dr. Posey ReaKommor.  Final diagnoses:  Idiopathic intracranial hypertension  Second trimester pregnancy    Rx / DC Orders ED Discharge Orders          Ordered    acetaZOLAMIDE (DIAMOX) 250 MG tablet  2 times daily        07/10/21 [id number]    Ambulatory referral to Neurology       Comments: An appointment is requested in approximately: 1 week   07/10/21 [id number]             Dione BoozeGlick, Ziyon Cedotal, MD 07/10/21 0730

## 2021-07-10 NOTE — H&P (Signed)
Adriana Harrell is a 30 y.o. female presenting for pseudotumor cerebri. HA x 1 week. Has been controlled with reglan and Fioricet intermittently until today. New onset visual changes today. In the MAU she was given magnesium, cyclobenzaprine, Zofran, Benadryl, Reglan, and solu-medrol 500mg  IV without relief. MRI and MRV were WNL. She ultimately underwent an LP and was dx with pseudotumor cerebri.  Her pregnancy has been overall uncomplicated. She has had some pubic bone pain that has been improving. She has a hx of two prior CS and is planning a RCS with BTL. She failed her one hour GTT by 1 point and is planning a 3hr GTT as an outpatient.   OB History     Gravida  4   Para  2   Term  2   Preterm      AB  1   Living  2      SAB  1   IAB      Ectopic      Multiple      Live Births  2          Past Medical History:  Diagnosis Date   Migraine    Past Surgical History:  Procedure Laterality Date   CESAREAN SECTION     x2   TONSILLECTOMY     Family History: family history is not on file. Social History:  reports that she has quit smoking. She has never used smokeless tobacco. She reports that she does not currently use alcohol. She reports that she does not currently use drugs.     Maternal Diabetes: No - 1hr GTT was elevated and 3hr GTT not yet done Genetic Screening: Normal Maternal Ultrasounds/Referrals: Normal Fetal Ultrasounds or other Referrals:  None Maternal Substance Abuse:  No Significant Maternal Medications:  None Significant Maternal Lab Results:  None Other Comments:  None  Review of Systems History   Blood pressure 110/66, pulse 93, temperature 98.8 F (37.1 C), temperature source Temporal, resp. rate 17, last menstrual period 12/11/2020, SpO2 100 %. Exam Physical Exam  Prenatal labs: ABO, Rh:   Antibody:   Rubella:   RPR:    HBsAg:    HIV:    GBS:   unknown  Assessment/Plan: 30 yo 32 presenting with intractable HA x 1 week  with visual changes found to have pseudotumor cerebri following lumbar puncture in the ED. She has failed multiple rounds of pain medication.  MRI and MRV were WNL. LP demonstrated high opening pressure c/w  IHT. Patient is admitted to [facility] service s/s being > [redacted] weeks gestation.  Her pregnancy is uncomplicated. Neurology, Opthamology, and Internal Medicine have all been consulted and are managing her IHT. Neurology initially stated she could be managed as an outpatient, but patient still uncomfortable after LP so is admitted for pain control.  Patient is counseled regarding the sure of acetazolamide in pregnancy and overall it is felt that benefits outweigh potential risks at this time.  Acetazolamide 500mg  BID.   [facility] 07/10/2021, 12:43 PM

## 2021-07-10 NOTE — ED Provider Notes (Signed)
  Physical Exam  BP 104/62   Pulse 96   Temp 98.8 F (37.1 C) (Temporal)   Resp 18   LMP 12/11/2020 (Exact Date)   SpO2 98%   Physical Exam Vitals and nursing note reviewed.  Constitutional:      General: She is not in acute distress.    Appearance: She is well-developed.  HENT:     Head: Normocephalic and atraumatic.  Eyes:     Conjunctiva/sclera: Conjunctivae normal.  Cardiovascular:     Rate and Rhythm: Normal rate and regular rhythm.     Heart sounds: No murmur heard. Pulmonary:     Effort: Pulmonary effort is normal. No respiratory distress.     Breath sounds: Normal breath sounds.  Abdominal:     Palpations: Abdomen is soft.     Tenderness: There is no abdominal tenderness.  Musculoskeletal:     Cervical back: Neck supple.  Skin:    General: Skin is warm and dry.  Neurological:     Mental Status: She is alert.     Cranial Nerves: No cranial nerve deficit or dysarthria.     Sensory: No sensory deficit.    ED Course/Procedures   Procedures  MDM  Patient received in signout with concerns for IIH in pregnancy.  CSF studies negative for meningitis.  Patient admitted to her private OB/GYN for intractable headache with medicine consult and neurology consulting.  Patient started acetazolamide here in the ER.       Glendora Score, MD 07/10/21 [id number]

## 2021-07-10 NOTE — ED Notes (Signed)
Assisted patient to bathroom ,  

## 2021-07-10 NOTE — ED Notes (Signed)
Dr Velvet Bathe at bedside.

## 2021-07-10 NOTE — Consult Note (Addendum)
Neurology Consultation  Reason for Consult: headache (intractable x 7 days) Referring Physician: Dr. Preston Fleeting  CC: Intractable headache  History is obtained from: Patient, chart  HPI: Adriana Harrell is a 30 y.o. female past medical history of migraines many years ago has not had a headache in about a decade, who is currently 28 weeks 2 days pregnant, G4 P2A1 presenting to the emergency room for unremitting headaches from metallosis.. She reports that she has been having headaches starting about 1 week ago.   Describes the headache as a holocephalic headache with throbbing nature. No significant factors that alleviate the headache but laying down and bending down exacerbate the headache. Also describes some blurred vision in the right eye.  Denies any whooshing sounds in her ears.  For the past couple of days has also noticed floaters like black spots but no significant visual obscuration. Has a history of migraines as a teenager and young adult, for which she was on Topamax for a while but then the headaches stopped and she has not had them for almost a decade. She had been seen last week and given Fioricet and Reglan which she had been taking regularly which initially helped but again the headache became worse.  Also has photophobia and nausea along with the blurred vision and periods of dizziness when she lays down with worsening of the headache. Denies any new medications. Denies headaches during the prior pregnancies.   In the MAU today, MRI brain was done-normal.  MRV head was done-normal.  I was called for curbside consultation after imaging-after she had been given magnesium, cyclobenzaprine, Zofran, Benadryl, Reglan-without any relief.  I recommended trial of high-dose steroids-was given Solu-Medrol 500 mg IV x1 without any relief.  Given the positional worsening and visual symptoms of blurred vision in the right eye with a headache going on for 1 week, IHT is high in the differentials  and I recommended an urgent LP with opening pressure, for which she was transferred to the emergency room  ROS: Full ROS was performed and is negative except as noted in the HPI.  Past Medical History:  Diagnosis Date   Migraine     History reviewed. No pertinent family history.  Social History:   reports that she has quit smoking. She has never used smokeless tobacco. She reports that she does not currently use alcohol. She reports that she does not currently use drugs.  Medications  Current Facility-Administered Medications:    lactated ringers infusion, , Intravenous, Continuous, Simpson, Danielle L, CNM, Last Rate: 125 mL/hr at 07/09/21 2112, New Bag at 07/09/21 2112  Current Outpatient Medications:    acetaminophen (TYLENOL) 500 MG tablet, Take 1,000 mg by mouth every 6 (six) hours as needed., Disp: , Rfl:    albuterol (PROVENTIL HFA;VENTOLIN HFA) 108 (90 Base) MCG/ACT inhaler, Inhale 2 puffs into the lungs every 6 (six) hours as needed for wheezing or shortness of breath., Disp: 1 Inhaler, Rfl: 2   butalbital-acetaminophen-caffeine (FIORICET) 50-325-40 MG tablet, Take 1-2 tablets by mouth every 6 (six) hours as needed for headache or migraine., Disp: 20 tablet, Rfl: 0   cyclobenzaprine (FLEXERIL) 10 MG tablet, Take 1 tablet (10 mg total) by mouth 2 (two) times daily as needed for muscle spasms., Disp: 20 tablet, Rfl: 0   metoCLOPramide (REGLAN) 10 MG tablet, Take 1 tablet (10 mg total) by mouth 3 (three) times daily with meals., Disp: 90 tablet, Rfl: 1   Prenatal Vit-Fe Fumarate-FA (PRENATAL MULTIVITAMIN) TABS tablet, Take 1 tablet  by mouth daily at 12 noon., Disp: , Rfl:    simethicone (GAS-X) 80 MG chewable tablet, Chew 1-2 tablets (80-160 mg total) by mouth every 6 (six) hours as needed for flatulence., Disp: 30 tablet, Rfl: 0  Exam: Current vital signs: BP (!) 108/59   Pulse 76   Temp 98.4 F (36.9 C) (Oral)   Resp 16   LMP 12/11/2020 (Exact Date)   SpO2 100%  Vital  signs in last 24 hours: Temp:  [98.4 F (36.9 C)] 98.4 F (36.9 C) (09/08 1432) Pulse Rate:  [72-96] 76 (09/08 2125) Resp:  [16] 16 (09/08 1432) BP: (101-127)/(43-82) 108/59 (09/08 2125) SpO2:  [100 %] 100 % (09/08 2125) GENERAL: Awake, alert in NAD HEENT: - Normocephalic and atraumatic, dry mm, no LN++, no Thyromegally, unable to visualize discs well LUNGS - Clear to auscultation bilaterally with no wheezes CV - S1S2 RRR, no m/r/g, equal pulses bilaterally. ABDOMEN -gravid nontender Ext: warm, well perfused, intact peripheral pulses  Neurological exam Awake alert oriented x3 No dysarthria Aphasia Pupils equal round react light, extraocular movements intact, visual fields full, facial sensation intact, face symmetric, tongue and palate midline. Motor examination with symmetric strength 5/5 in all fours Sensation intact to light touch without extinction Coordination with no evidence of dysmetria  Labs I have reviewed labs in epic and the results pertinent to this consultation are:  CBC    Component Value Date/Time   WBC 12.5 (H) 07/09/2021 1539   RBC 3.64 (L) 07/09/2021 1539   HGB 10.2 (L) 07/09/2021 1539   HGB 15.0 09/15/2012 1556   HCT 31.1 (L) 07/09/2021 1539   HCT 43.4 09/15/2012 1556   PLT 255 07/09/2021 1539   PLT 313 09/15/2012 1556   MCV 85.4 07/09/2021 1539   MCV 85 09/15/2012 1556   MCH 28.0 07/09/2021 1539   MCHC 32.8 07/09/2021 1539   RDW 13.1 07/09/2021 1539   RDW 13.0 09/15/2012 1556   LYMPHSABS 2.4 05/19/2021 2047   MONOABS 0.8 05/19/2021 2047   EOSABS 0.2 05/19/2021 2047   BASOSABS 0.0 05/19/2021 2047    CMP     Component Value Date/Time   NA 136 07/09/2021 1539   NA 138 09/15/2012 1556   K 3.4 (L) 07/09/2021 1539   K 4.0 09/15/2012 1556   CL 106 07/09/2021 1539   CL 107 09/15/2012 1556   CO2 23 07/09/2021 1539   CO2 22 09/15/2012 1556   GLUCOSE 81 07/09/2021 1539   GLUCOSE 90 09/15/2012 1556   BUN <5 (L) 07/09/2021 1539   BUN 8  09/15/2012 1556   CREATININE 0.51 07/09/2021 1539   CREATININE 0.65 09/15/2012 1556   CALCIUM 8.4 (L) 07/09/2021 1539   CALCIUM 9.1 09/15/2012 1556   PROT 6.0 (L) 07/09/2021 1539   PROT 8.7 (H) 09/15/2012 1556   ALBUMIN 2.5 (L) 07/09/2021 1539   ALBUMIN 4.5 09/15/2012 1556   AST 34 07/09/2021 1539   AST 23 09/15/2012 1556   ALT 30 07/09/2021 1539   ALT 18 09/15/2012 1556   ALKPHOS 113 07/09/2021 1539   ALKPHOS 122 09/15/2012 1556   BILITOT 0.3 07/09/2021 1539   BILITOT 0.5 09/15/2012 1556   GFRNONAA >60 07/09/2021 1539   GFRNONAA >60 09/15/2012 1556   GFRAA >60 09/14/2019 0755   GFRAA >60 09/15/2012 1556    Imaging I have reviewed the images obtained: MRI brain-normal MRV head-no evidence of dural venous sinus thrombosis  Assessment:  30 year old woman past history of migraines many years ago but has  not had a headache in many years, currently 28 weeks 2 days pregnant-G4, P2 A1-coming with intractable headache for 7 days. Some relief initially with Fioricet and Reglan but past 2 days headache has been worse along with blurred vision in the right eye as well as worsening headache while bending down or laying down. Visual symptoms as well as the description of worsening as above-concern for idiopathic intracranial hypertension. Imaging personally reviewed-MRI without contrast and MRV of the head-unremarkable.  Recommendations: -Urgent LP with opening pressure -Send CSF for cell count, glucose, protein, gram stain -She would require an urgent ophthalmological examination to evaluate for presence and extent of papilledema. -In case of increased opening pressure, she would benefit from starting acetazolamide 500 twice daily.  She is currently second trimester and although there are no direct studies pertaining to safety, there is enough consensus among experts to start acetazolamide and its relatively safe after the first trimester. -Topamax would be other option for this type of  headache if this is truly I IHT-but it is contraindicated in pregnancy. -Other options would include serial LPs.  Preliminary plan discussed with Dr. Preston Fleeting.  -- Milon Dikes, MD Neurologist [facility] Pager: [phone]    Addendum Called by Dr. Arvella Merles pressure 24, drained CSF for a closing pressure of 15. I would recommend starting acetazolamide 500 twice daily. I would recommend an urgent ophthalmology consultation outpatient as well as follow-up with outpatient neurology-I will send a message to the outpatient practice to see if I can help expedite outpatient neurology follow-up. If she has relief in her headache with the fluid drained-she can be discharged home-I do not see a reason to keep her inpatient. Plan discussed with Dr. Preston Fleeting over the phone   -- Milon Dikes, MD Neurologist [facility] Pager: [phone]

## 2021-07-10 NOTE — Consult Note (Addendum)
Medical Consultation   Adriana Harrell  PYP:[id number]  DOB: 04-02-1991  DOA: 07/09/2021  PCP: Lance Morin, MD   Outpatient Specialists: [facility]    Requesting physician: [facility]  Reason for consultation: Stroke r/o - MRI, MRV negative.  Given Fiorcet and opioids without improvement.  Opening pressure 24, removed 8 cc fluid.  Ophthalmology with see her to evaluate for papilledema.  She is recommended to have acetazolamide (category C).  May need serial LP.  Neuro and OB/GYN will discuss further therapies for intractable headache.   History of Present Illness: Adriana Harrell is an 30 y.o. female G4P2 at 59 weeks with headache.  She reports an uncomplicated pregnancy and has a 5 and 32 yo at home with also uncomplicated pregnancies.  She had migraines in her late teens/early 50s but none since.  She developed a headache that is all over her head about 1 week ago.  Nothing seems to help the headache and the pain worsened yesterday.  She is having R blurry vision.  Nothing seems to make it better.   Review of Systems:  ROS As per HPI otherwise review of systems negative.    Past Medical History: Past Medical History:  Diagnosis Date   Migraine     Past Surgical History: Past Surgical History:  Procedure Laterality Date   CESAREAN SECTION     x2   TONSILLECTOMY       Allergies:   Allergies  Allergen Reactions   Penicillins Hives   Tamiflu [Oseltamivir Phosphate] Hives   Tamiflu [Oseltamivir] Hives     Social History:  reports that she has quit smoking. She has never used smokeless tobacco. She reports that she does not currently use alcohol. She reports that she does not currently use drugs.   Family History: History reviewed. No pertinent family history.    Physical Exam: Vitals:   07/10/21 1000 07/10/21 1100 07/10/21 1230 07/10/21 1300  BP: 101/62 110/66 129/87 129/87  Pulse: 87 93 (!) 106 99  Resp: 17  16 18   Temp:   97.7 F  (36.5 C)   TempSrc:   Temporal   SpO2: 100% 100% 100% 100%    Constitutional: Alert and awake, oriented x3, not in any acute distress. 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 Respiratory effort normal. No accessory muscle use.  Abdomen: soft nontender, gavid Musculoskeletal: : no cyanosis, clubbing or edema noted bilaterally Neuro: Cranial nerves II-XII intact Psych: judgement and insight appear normal, stable mood and affect, mental status, mildly emotionally labile Skin: no rashes or lesions or ulcers, no induration or nodules    Data reviewed:  I have personally reviewed the recent labs and imaging studies  Pertinent Labs:   K+ 3.4 Albumin 2.5 WBC 12.5 Hgb 10.2 UA WNL CSF WNL   Inpatient Medications:   Scheduled Meds: Continuous Infusions:  lactated ringers 125 mL/hr at 07/10/21 0746     Radiological Exams on Admission: MR BRAIN WO CONTRAST  Result Date: 07/09/2021 CLINICAL DATA:  Initial evaluation for acute headache trauma currently pregnant. EXAM: MRI HEAD  WITHOUT CONTRAST TECHNIQUE: Multiplanar, multiecho pulse sequences of the brain and surrounding structures were obtained without intravenous contrast. COMPARISON:  Comparison made with prior MRV performed earlier the same day. FINDINGS: Brain: Cerebral volume within normal limits. No focal parenchymal signal abnormality. No abnormal foci of restricted diffusion to suggest acute or subacute ischemia. Gray-white matter differentiation well maintained. No encephalomalacia to suggest chronic cortical infarction. No foci of susceptibility artifact to suggest acute  or chronic intracranial hemorrhage. No mass lesion, midline shift or mass effect. Ventricles normal size without hydrocephalus. No extra-axial fluid collection. Pituitary gland somewhat prominent with convex border superiorly, consistent with current pregnancy status. Suprasellar region normal. Midline structures intact and normal. Vascular: Major intracranial vascular flow voids are maintained. Right V4 segment mildly tortuous and partially invaginating on the adjacent medulla. Skull and upper cervical spine: Craniocervical junction within normal limits. Diffusely decreased T1 signal intensity seen within the visualized bone marrow, likely related to current pregnancy. No focal marrow replacing lesion. No scalp soft tissue abnormality. Sinuses/Orbits: Globes and orbital soft tissues within normal limits. Mild scattered mucosal thickening noted within the ethmoidal air cells and maxillary sinuses. Paranasal sinuses are otherwise clear. No mastoid effusion. Inner ear structures grossly normal. Other: None. IMPRESSION: Normal brain MRI.  No acute intracranial abnormality identified. Electronically Signed   By: Rise Mu M.D.   On: 07/09/2021 23:52   MR MRV HEAD WO CM  Result Date: 07/09/2021 CLINICAL DATA:  Ongoing headache, concern for dural venous sinus thrombosis EXAM: MR VENOGRAM HEAD WITHOUT CONTRAST TECHNIQUE: Angiographic images of the intracranial venous structures were acquired using MRV technique without intravenous contrast. COMPARISON:  No pertinent prior exam. FINDINGS: There is no evidence of dural venous sinus or deep cerebral vein thrombosis. No dural venous sinus stenosis. IMPRESSION: Negative for venous sinus thrombosis. Electronically Signed   By: Wiliam Ke M.D.   On: 07/09/2021 19:06    Impression/Recommendations Principal Problem:   Pseudotumor cerebri Active Problems:   Supervision of normal IUP (intrauterine pregnancy) in multigravida, unspecified trimester  Pseudotumor  cerebrii -Patient presenting with headache during pregnancy -Unremarkable labs and MRI -LP with elevated opening pressure -Will continue to consult in case we are needed, but neurology is advising about management of this issue (see their long note) -Ophthalmology has been consulted -Based on risks/benefits, acetazolamide has been recommended -She may also need opioids for treatment of her headache pain -The patient understands the risks associated with these medications in pregnancy and wishes to proceed.  Normal IUP -Admitted to OB/GYN service -Management of pregnancy per OB    Thank you for this consultation.  [facility] hospitalist team will follow the patient with you.   Time Spent: 50 minutes  Jonah Blue M.D. [facility] 07/10/2021, 2:07 PM

## 2021-07-10 NOTE — Consult Note (Signed)
CC:  Chief Complaint  Patient presents with   Headache   Vaginal Discharge    HPI: Adriana Harrell is a 30 y.o. female w/o significant POH and PMH below who presents for evaluation of IIH. Already had MRI/MRV negative and LP w/ OP elevated. Symptoms started about a week ago. Vision grainy or pixilated. Headache for 1 week. Currently [redacted] weeks pregnant. Reports started on Diamox for IIH  ROS: Denies fever/chills, unintentional weight loss, chest pain, irregular heart rhythm, SOB, cough, wheezing, abdominal pain, melena, hematochezia, weakness, numbness, slurring of speech, facial droop, muscle weakness, joint pain, skin rash, tattoos, depressed mood  PMH: Past Medical History:  Diagnosis Date   Migraine     PSH: Past Surgical History:  Procedure Laterality Date   CESAREAN SECTION     x2   TONSILLECTOMY      Meds: No current facility-administered medications on file prior to encounter.   Current Outpatient Medications on File Prior to Encounter  Medication Sig Dispense Refill   acetaminophen (TYLENOL) 500 MG tablet Take 1,000 mg by mouth every 6 (six) hours as needed.     butalbital-acetaminophen-caffeine (FIORICET) 50-325-40 MG tablet Take 1-2 tablets by mouth every 6 (six) hours as needed for headache or migraine. 20 tablet 0   ferrous sulfate 325 (65 FE) MG EC tablet Take 325 mg by mouth daily.     metoCLOPramide (REGLAN) 10 MG tablet Take 1 tablet (10 mg total) by mouth 3 (three) times daily with meals. 90 tablet 1   Prenatal Vit-Fe Fumarate-FA (PRENATAL MULTIVITAMIN) TABS tablet Take 1 tablet by mouth daily at 12 noon.      SH: Social History   Socioeconomic History   Marital status: Married    Spouse name: Not on file   Number of children: Not on file   Years of education: Not on file   Highest education level: Not on file  Occupational History   Not on file  Tobacco Use   Smoking status: Former   Smokeless tobacco: Never  Substance and Sexual Activity    Alcohol use: Not Currently   Drug use: Not Currently   Sexual activity: Yes  Other Topics Concern   Not on file  Social History Narrative   Not on file   Social Determinants of Health   Financial Resource Strain: Not on file  Food Insecurity: Not on file  Transportation Needs: Not on file  Physical Activity: Not on file  Stress: Not on file  Social Connections: Not on file    FH: History reviewed. No pertinent family history.  Exam:  Zenaida Niece: OD: 3 pt [place] OS: 3 pt [place]  CVF: OD: full OS: full  EOM: OD: full d/v OS: full d/v  Pupils: OD: 3->2 mm, no APD OS: 3->2 mm, no APD  IOP: soft OU  External: OD: no periorbital edema, no proptosis, good orbicularis strength OS: no periorbital edema, no proptosis, good orbicularis strength  ULE:  Pen Light Exam: L/L: OD: WNL OS: WNL  C/S: OD: white and quiet OS: white and quiet  K: OD: clear, no abnormal staining OS: clear, no abnormal staining  A/C: OD: grossly deep and quiet appearing by pen light OS: grossly deep and quiet appearing by pen light  I: OD: round and regular OS: round and regular  L: OD: NSC OS: NSC  DFE: dilated @ 5:15 PM w/ Tropic and Phenyl OU  V: OD: clear OS: clear  N: OD: C/D 0.1, no obvious disc edema OS: C/D  0.1, no obvious disc edema  M: OD: flat, no obvious macular pathology OS: flat, no obvious macular pathology  V: OD: normal appearing vessels OS: normal appearing vessels  P: OD: retina flat 360, no obvious mass/RT/RD OS: retina flat 360, no obvious mass/RT/RD  A/P:  1. IIH: - MRI/MRV negative but LP w/ elevated opening pressure. - No florid disc edema on bedside exam; however, indirect ophthalmoscopy has limited magnification and visualization - Recommend outpatient baseline visual field and RNFL testing to get baseline - Defer treatment decision about Diamox or other meds for IIH in a pregnant patient to Neurology and OBGYN - Gave pt card to call office to  schedule follow-up on Thursday AM to get baseline IIH testing done   [place] T. Sherryll Burger, MD [facility] [phone]

## 2021-07-10 NOTE — Discharge Instructions (Addendum)
Please follow-up with the neurologist and with the ophthalmologist.  Continue routine prenatal care.

## 2021-07-11 DIAGNOSIS — Z3490 Encounter for supervision of normal pregnancy, unspecified, unspecified trimester: Secondary | ICD-10-CM

## 2021-07-11 DIAGNOSIS — G932 Benign intracranial hypertension: Secondary | ICD-10-CM | POA: Diagnosis not present

## 2021-07-11 DIAGNOSIS — R519 Headache, unspecified: Secondary | ICD-10-CM | POA: Diagnosis not present

## 2021-07-11 MED ORDER — MAGNESIUM SULFATE 50 % IJ SOLN
1.0000 g | Freq: Once | INTRAMUSCULAR | Status: AC
  Administered 2021-07-11: 1 g via INTRAVENOUS
  Filled 2021-07-11: qty 2

## 2021-07-11 MED ORDER — MAGNESIUM SULFATE 50 % IJ SOLN
1.0000 g | Freq: Once | INTRAMUSCULAR | Status: DC
  Filled 2021-07-11: qty 2

## 2021-07-11 MED ORDER — SODIUM CHLORIDE 0.9 % IV SOLN
25.0000 mg | Freq: Once | INTRAVENOUS | Status: AC
  Administered 2021-07-11: 25 mg via INTRAVENOUS
  Filled 2021-07-11: qty 1

## 2021-07-11 NOTE — Progress Notes (Signed)
PROGRESS NOTE  BAYLOR TEEGARDEN DPO:[id number] DOB: 1991/08/04 DOA: 07/09/2021 PCP: Lance Morin, MD  Outpatient Specialists: [facility]      Requesting physician: [facility]   Reason for consultation: Stroke r/o - MRI, MRV negative.  Given Fiorcet and opioids without improvement.  Opening pressure 24, removed 8 cc fluid.  Ophthalmology with see her to evaluate for papilledema.  She is recommended to have acetazolamide (category C).  May need serial LP.  Neuro and OB/GYN will discuss further therapies for intractable headache.               History of Present Illness: Adriana Harrell is an 30 y.o. female G4P2 at 52 weeks with headache.  She reports an uncomplicated pregnancy and has a 5 and 32 yo at home with also uncomplicated pregnancies.  She had migraines in her late teens/early 5s but none since.  She developed a headache that is all over her head about 1 week ago.  Nothing seems to help the headache and the pain worsened yesterday.  She is having R blurry vision.  Nothing seems to make it better.   HPI/Recap of past 24 hours: 07/11/21: Seen and examined at bedside.  Her loved one present.  Headache still present 7/10, + nausea with no vomiting.  Headache is worse with ambulation.  Assessment/Plan: Principal Problem:   Pseudotumor cerebri Active Problems:   Supervision of normal IUP (intrauterine pregnancy) in multigravida, unspecified trimester   Pseudotumor   Pregnancy  Pseudotumor cerebrii Still with headache 7/10 On Diamox 500 mg BID Unremarkable labs and MRI -LP with elevated opening pressure -Seen by neurology and ophthalmology -Based on risks/benefits, acetazolamide has been recommended -She may also need opioids for treatment of her headache pain -The patient understands the risks associated with these medications in pregnancy and wishes to proceed.  Nausea in pregnancy Started on Reglan   Normal IUP -Admitted to OB/GYN service -Management of pregnancy  per OB   Thank you for the consult.  We will continue to follow with you.   Objective: Vitals:   07/10/21 2100 07/10/21 2229 07/11/21 0738 07/11/21 1100  BP:  110/60 (!) 95/45 (!) 92/46  Pulse:   89 88  Resp:  18 20 19   Temp:  98 F (36.7 C) 98 F (36.7 C) 98.2 F (36.8 C)  TempSrc:  Oral Oral Oral  SpO2:   100% 100%  Weight: 67.1 kg     Height: 5\' 2"  (1.575 m)       Intake/Output Summary (Last 24 hours) at 07/11/2021 1233 Last data filed at 07/11/2021 1100 Gross per 24 hour  Intake 4484.68 ml  Output --  Net 4484.68 ml   Filed Weights   07/10/21 2100  Weight: 67.1 kg    Exam:  General: 30 y.o. year-old female well developed well nourished in no acute distress.  Alert and oriented x3. Cardiovascular: Regular rate and rhythm with no rubs or gallops.  No thyromegaly or JVD noted.   Respiratory: Clear to auscultation with no wheezes or rales. Good inspiratory effort. Abdomen: Gravida abdomen, bowel sounds present. Musculoskeletal: No lower extremity edema. 2/4 pulses in all 4 extremities. Skin: No ulcerative lesions noted or rashes, Psychiatry: Mood is appropriate for condition and setting   Data Reviewed: CBC: Recent Labs  Lab 07/09/21 1539  WBC 12.5*  HGB 10.2*  HCT 31.1*  MCV 85.4  PLT 255   Basic Metabolic Panel: Recent Labs  Lab 07/09/21 1539  NA 136  K 3.4*  CL 106  CO2 23  GLUCOSE 81  BUN <5*  CREATININE 0.51  CALCIUM 8.4*   GFR: Estimated Creatinine Clearance: 90.7 mL/min (by C-G formula based on SCr of 0.51 mg/dL). Liver Function Tests: Recent Labs  Lab 07/09/21 1539  AST 34  ALT 30  ALKPHOS 113  BILITOT 0.3  PROT 6.0*  ALBUMIN 2.5*   No results for input(s): LIPASE, AMYLASE in the last 168 hours. No results for input(s): AMMONIA in the last 168 hours. Coagulation Profile: No results for input(s): INR, PROTIME in the last 168 hours. Cardiac Enzymes: No results for input(s): CKTOTAL, CKMB, CKMBINDEX, TROPONINI in the last 168  hours. BNP (last 3 results) No results for input(s): PROBNP in the last 8760 hours. HbA1C: No results for input(s): HGBA1C in the last 72 hours. CBG: No results for input(s): GLUCAP in the last 168 hours. Lipid Profile: No results for input(s): CHOL, HDL, LDLCALC, TRIG, CHOLHDL, LDLDIRECT in the last 72 hours. Thyroid Function Tests: No results for input(s): TSH, T4TOTAL, FREET4, T3FREE, THYROIDAB in the last 72 hours. Anemia Panel: No results for input(s): VITAMINB12, FOLATE, FERRITIN, TIBC, IRON, RETICCTPCT in the last 72 hours. Urine analysis:    Component Value Date/Time   COLORURINE YELLOW 07/09/2021 1452   APPEARANCEUR CLEAR 07/09/2021 1452   APPEARANCEUR Hazy 09/15/2012 1556   LABSPEC <1.005 (L) 07/09/2021 1452   LABSPEC 1.043 09/15/2012 1556   PHURINE 7.0 07/09/2021 1452   GLUCOSEU NEGATIVE 07/09/2021 1452   GLUCOSEU Negative 09/15/2012 1556   HGBUR NEGATIVE 07/09/2021 1452   BILIRUBINUR NEGATIVE 07/09/2021 1452   BILIRUBINUR Negative 09/15/2012 1556   KETONESUR NEGATIVE 07/09/2021 1452   PROTEINUR NEGATIVE 07/09/2021 1452   NITRITE NEGATIVE 07/09/2021 1452   LEUKOCYTESUR NEGATIVE 07/09/2021 1452   LEUKOCYTESUR Negative 09/15/2012 1556   Sepsis Labs: @LABRCNTIP (procalcitonin:4,lacticidven:4)  ) Recent Results (from the past 240 hour(s))  CSF culture w Gram Stain     Status: None (Preliminary result)   Collection Time: 07/10/21  6:21 AM   Specimen: CSF; Cerebrospinal Fluid  Result Value Ref Range Status   Specimen Description CSF  Final   Special Requests NONE  Final   Gram Stain CYTOSPIN SMEAR NO WBC SEEN NO ORGANISMS SEEN   Final   Culture   Final    NO GROWTH 1 DAY Performed at [facility], [address], [place], [place]    Report Status PENDING  Incomplete  Resp Panel by RT-PCR (Flu A&B, Covid) Nasopharyngeal Swab     Status: None   Collection Time: 07/10/21  3:45 PM   Specimen: Nasopharyngeal Swab; Nasopharyngeal(NP) swabs in vial  transport medium  Result Value Ref Range Status   SARS Coronavirus 2 by RT PCR NEGATIVE NEGATIVE Final    Comment: (NOTE) SARS-CoV-2 target nucleic acids are NOT DETECTED.  The SARS-CoV-2 RNA is generally detectable in upper respiratory specimens during the acute phase of infection. The lowest concentration of SARS-CoV-2 viral copies this assay can detect is 138 copies/mL. A negative result does not preclude SARS-Cov-2 infection and should not be used as the sole basis for treatment or other patient management decisions. A negative result may occur with  improper specimen collection/handling, submission of specimen other than nasopharyngeal swab, presence of viral mutation(s) within the areas targeted by this assay, and inadequate number of viral copies(<138 copies/mL). A negative result must be combined with clinical observations, patient history, and epidemiological information. The expected result is Negative.  Fact Sheet for Patients:  09/09/21  Fact Sheet for Healthcare Providers:  SeriousBroker.it  This test is no t yet approved or cleared by the [place] FDA and  has been authorized for detection and/or diagnosis of SARS-CoV-2 by FDA under an Emergency Use Authorization (EUA). This EUA will remain  in effect (meaning this test can be used) for the duration of the COVID-19 declaration under Section 564(b)(1) of the Act, 21 U.S.C.section 360bbb-3(b)(1), unless the authorization is terminated  or revoked sooner.       Influenza A by PCR NEGATIVE NEGATIVE Final   Influenza B by PCR NEGATIVE NEGATIVE Final    Comment: (NOTE) The Xpert Xpress SARS-CoV-2/FLU/RSV plus assay is intended as an aid in the diagnosis of influenza from Nasopharyngeal swab specimens and should not be used as a sole basis for treatment. Nasal washings and aspirates are unacceptable for Xpert Xpress SARS-CoV-2/FLU/RSV testing.  Fact  Sheet for Patients: BloggerCourse.com  Fact Sheet for Healthcare Providers: SeriousBroker.it  This test is not yet approved or cleared by the [place] FDA and has been authorized for detection and/or diagnosis of SARS-CoV-2 by FDA under an Emergency Use Authorization (EUA). This EUA will remain in effect (meaning this test can be used) for the duration of the COVID-19 declaration under Section 564(b)(1) of the Act, 21 U.S.C. section 360bbb-3(b)(1), unless the authorization is terminated or revoked.  Performed at [facility], [address], [place], [place] [address]       Studies: No results found.  Scheduled Meds:  acetaminophen  1,000 mg Oral Q6H   acetaZOLAMIDE ER  500 mg Oral Q12H   docusate sodium  100 mg Oral Daily   prenatal multivitamin  1 tablet Oral Q1200   prenatal multivitamin  1 tablet Oral Q1200    Continuous Infusions:  lactated ringers 125 mL/hr at 07/11/21 1038     LOS: 1 day     Darlin Drop, MD [facility] Pager [phone]  If 7PM-7AM, please contact night-coverage [email or web address] Password [facility] 07/11/2021, 12:33 PM

## 2021-07-11 NOTE — Progress Notes (Signed)
Subjective: 7/10 headache today. Nearly becomes tearful when discussing her headache pain.   Objective: Current vital signs: BP (!) 95/45 (BP Location: Left Arm)   Pulse 89   Temp 98 F (36.7 C) (Oral)   Resp 20   Ht  (1.575 m)   Wt 67.1 kg   LMP 12/11/2020 (Exact Date)   SpO2 100%   BMI 27.06 kg/m  Vital signs in last 24 hours: Temp:  [97.7 F (36.5 C)-98 F (36.7 C)] 98 F (36.7 C) (09/10 0738) Pulse Rate:  [76-127] 89 (09/10 0738) Resp:  [16-20] 20 (09/10 0738) BP: (92-129)/(45-87) 95/45 (09/10 0738) SpO2:  [96 %-100 %] 100 % (09/10 0738) Weight:  [67.1 kg] 67.1 kg (09/09 2100)  Intake/Output from previous day: 09/09 0701 - 09/10 0700 In: 240 [P.O.:240] Out: -  Intake/Output this shift: No intake/output data recorded. Nutritional status:  Diet Order             Diet regular Room service appropriate? Yes; Fluid consistency: Thin  Diet effective now                  HEENT: [place]/AT Lungs: Respirations unlabored Ext: No edema  Neurologic Exam: Ment: Intact to complex questions and commands. Speech fluent with intact comprehension. No dysarthria.  CN: EOMI. Fixates and tracks normally. Face symmetric.  Motor: Normal movement of extremities.  Cerebellar: No ataxia noted.  Gait: Deferred  Lab Results: Results for orders placed or performed during the hospital encounter of 07/09/21 (from the past 48 hour(s))  Urinalysis, Routine w reflex microscopic Urine, Clean Catch     Status: Abnormal   Collection Time: 07/09/21  2:52 PM  Result Value Ref Range   Color, Urine YELLOW YELLOW   APPearance CLEAR CLEAR   Specific Gravity, Urine <1.005 (L) 1.005 - 1.030   pH 7.0 5.0 - 8.0   Glucose, UA NEGATIVE NEGATIVE mg/dL   Hgb urine dipstick NEGATIVE NEGATIVE   Bilirubin Urine NEGATIVE NEGATIVE   Ketones, ur NEGATIVE NEGATIVE mg/dL   Protein, ur NEGATIVE NEGATIVE mg/dL   Nitrite NEGATIVE NEGATIVE   Leukocytes,Ua NEGATIVE NEGATIVE    Comment: Microscopic not  done on urines with negative protein, blood, leukocytes, nitrite, or glucose < 500 mg/dL. Performed at [facility], [address], [place], [place] [address]   Protein / creatinine ratio, urine     Status: None   Collection Time: 07/09/21  3:11 PM  Result Value Ref Range   Creatinine, Urine 29.14 mg/dL   Total Protein, Urine <6 mg/dL   Protein Creatinine Ratio        0.00 - 0.15 mg/mg[Cre]    Comment: RESULT BELOW REPORTABLE RANGE, UNABLE TO CALCULATE. Performed at [facility], [address] [place], [place] [address]   CBC     Status: Abnormal   Collection Time: 07/09/21  3:39 PM  Result Value Ref Range   WBC 12.5 (H) 4.0 - 10.5 K/uL   RBC 3.64 (L) 3.87 - 5.11 MIL/uL   Hemoglobin 10.2 (L) 12.0 - 15.0 g/dL   HCT 82.9 (L) 56.2 - 13.0 %   MCV 85.4 80.0 - 100.0 fL   MCH 28.0 26.0 - 34.0 pg   MCHC 32.8 30.0 - 36.0 g/dL   RDW 86.5 78.4 - 69.6 %   Platelets 255 150 - 400 K/uL   nRBC 0.0 0.0 - 0.2 %    Comment: Performed at [facility], [address], [place], [place] [address]  Comprehensive metabolic panel     Status: Abnormal   Collection Time: 07/09/21  3:39 PM  Result Value Ref Range   Sodium 136 135 - 145 mmol/L   Potassium 3.4 (L) 3.5 - 5.1 mmol/L   Chloride 106 98 - 111 mmol/L   CO2 23 22 - 32 mmol/L   Glucose, Bld 81 70 - 99 mg/dL    Comment: Glucose reference range applies only to samples taken after fasting for at least 8 hours.   BUN <5 (L) 6 - 20 mg/dL   Creatinine, Ser 1.74 0.44 - 1.00 mg/dL   Calcium 8.4 (L) 8.9 - 10.3 mg/dL   Total Protein 6.0 (L) 6.5 - 8.1 g/dL   Albumin 2.5 (L) 3.5 - 5.0 g/dL   AST 34 15 - 41 U/L   ALT 30 0 - 44 U/L   Alkaline Phosphatase 113 38 - 126 U/L   Total Bilirubin 0.3 0.3 - 1.2 mg/dL   GFR, Estimated >94 >49 mL/min    Comment: (NOTE) Calculated using the CKD-EPI Creatinine Equation (2021)    Anion gap 7 5 - 15    Comment: Performed at [facility], [address], [place], [place] [address]  CSF  cell count with differential collection tube #: 1     Status: None   Collection Time: 07/10/21  6:20 AM  Result Value Ref Range   Tube # 3     Comment: Correction called to K Boon RN 07/10/21 1520 by BWylie CORRECTED ON 09/09 AT 1523: PREVIOUSLY REPORTED AS 1    Color, CSF COLORLESS COLORLESS   Appearance, CSF CLEAR CLEAR   Supernatant NOT INDICATED    RBC Count, CSF 0 0 /cu mm   WBC, CSF 1 0 - 5 /cu mm   Other Cells, CSF TOO FEW TO COUNT, SMEAR AVAILABLE FOR REVIEW     Comment: RARE NEUTROPHIL, RARE LYMPH, AND RARE MONOCYTES. Performed at [facility], [address] [place], [place] [address]   Protein and glucose, CSF     Status: None   Collection Time: 07/10/21  6:20 AM  Result Value Ref Range   Glucose, CSF 57 40 - 70 mg/dL   Total  Protein, CSF 21 15 - 45 mg/dL    Comment: Performed at [facility], [address], [place], [place] [address]  CSF cell count with differential     Status: None   Collection Time: 07/10/21  6:20 AM  Result Value Ref Range   Tube # 4    Color, CSF COLORLESS COLORLESS   Appearance, CSF CLEAR CLEAR   Supernatant NOT INDICATED    RBC Count, CSF 0 0 /cu mm   WBC, CSF 1 0 - 5 /cu mm   Other Cells, CSF TOO FEW TO COUNT, SMEAR AVAILABLE FOR REVIEW     Comment: RARE NEUTROPHIL, RARE LYMPH AND RARE MONOCYTES Performed at [facility], [address], [place], [place] [address]   CSF culture w Gram Stain     Status: None (Preliminary result)   Collection Time: 07/10/21  6:21 AM   Specimen: CSF; Cerebrospinal Fluid  Result Value Ref Range   Specimen Description CSF    Special Requests NONE    Gram Stain CYTOSPIN SMEAR NO WBC SEEN NO ORGANISMS SEEN     Culture      NO GROWTH 1 DAY Performed at [facility], [address] [place], [place] [address]    Report  Status PENDING   Resp Panel by RT-PCR (Flu A&B, Covid) Nasopharyngeal Swab     Status: None   Collection Time: 07/10/21  3:45 PM   Specimen: Nasopharyngeal Swab;  Nasopharyngeal(NP) swabs in vial transport medium  Result Value Ref Range   SARS Coronavirus 2 by RT PCR NEGATIVE NEGATIVE    Comment: (NOTE) SARS-CoV-2 target nucleic acids are NOT DETECTED.  The SARS-CoV-2 RNA is generally detectable in upper respiratory specimens during the acute phase of infection. The lowest concentration of SARS-CoV-2 viral copies this assay can detect is 138 copies/mL. A negative result does not preclude SARS-Cov-2 infection and should not be used as the sole basis for treatment or other patient management decisions. A negative result may occur with  improper specimen collection/handling, submission of specimen other than nasopharyngeal swab, presence of viral mutation(s) within the areas targeted by this assay, and inadequate number of viral copies(<138 copies/mL). A negative result must be combined with clinical observations, patient history, and epidemiological information. The expected result is Negative.  Fact Sheet for Patients:  BloggerCourse.com  Fact Sheet for Healthcare Providers:  SeriousBroker.it  This test is no t yet approved or cleared by the [place] FDA and  has been authorized for detection and/or diagnosis of SARS-CoV-2 by FDA under an Emergency Use Authorization (EUA). This EUA will remain  in effect (meaning this test can be used) for the duration of the COVID-19 declaration under Section 564(b)(1) of the Act, 21 U.S.C.section 360bbb-3(b)(1), unless the authorization is terminated  or revoked sooner.       Influenza A by PCR NEGATIVE NEGATIVE   Influenza B by PCR NEGATIVE NEGATIVE    Comment: (NOTE) The Xpert Xpress SARS-CoV-2/FLU/RSV plus assay is intended as an aid in the diagnosis of influenza from Nasopharyngeal swab specimens and should not be used as a sole basis for treatment. Nasal washings and aspirates are unacceptable for Xpert Xpress  SARS-CoV-2/FLU/RSV testing.  Fact Sheet for Patients: BloggerCourse.com  Fact Sheet for Healthcare Providers: SeriousBroker.it  This test is not yet approved or cleared by the [place] FDA and has been authorized for detection and/or diagnosis of SARS-CoV-2 by FDA under an Emergency Use Authorization (EUA). This EUA will remain in effect (meaning this test can be used) for the duration of the COVID-19 declaration under Section 564(b)(1) of the Act, 21 U.S.C. section 360bbb-3(b)(1), unless the authorization is terminated or revoked.  Performed at [facility], [address] [place], [place] [address]   Type and screen [facility]     Status: None   Collection Time: 07/10/21  3:56 PM  Result Value Ref Range   ABO/RH(D) O NEG    Antibody Screen POS    Sample Expiration 07/13/2021,2359    Antibody Identification      PASSIVELY ACQUIRED ANTI-D Performed at [facility], [address] [place], [place] [address]     Recent Results (from the past 240 hour(s))  CSF culture w Gram Stain     Status: None (Preliminary result)   Collection Time: 07/10/21  6:21 AM   Specimen: CSF; Cerebrospinal Fluid  Result Value Ref Range Status   Specimen Description CSF  Final   Special Requests NONE  Final   Gram Stain CYTOSPIN SMEAR NO WBC SEEN NO ORGANISMS SEEN   Final   Culture   Final    NO GROWTH 1 DAY Performed at [facility], [address] [place], [place] [address]    Report  Status PENDING  Incomplete  Resp Panel by RT-PCR (Flu A&B, Covid) Nasopharyngeal Swab     Status: None   Collection Time: 07/10/21  3:45 PM   Specimen: Nasopharyngeal Swab; Nasopharyngeal(NP) swabs in vial transport medium  Result Value Ref Range Status   SARS Coronavirus 2 by RT PCR NEGATIVE NEGATIVE Final    Comment: (NOTE) SARS-CoV-2 target nucleic acids are NOT DETECTED.  The SARS-CoV-2 RNA is generally  detectable in upper respiratory specimens during the acute phase of infection. The lowest concentration of SARS-CoV-2 viral copies this assay can detect is 138 copies/mL. A negative result does not preclude SARS-Cov-2 infection and should not be used as the sole basis for treatment or other patient management decisions. A negative result may occur with  improper specimen collection/handling, submission of specimen other than nasopharyngeal swab, presence of viral mutation(s) within the areas targeted by this assay, and inadequate number of viral copies(<138 copies/mL). A negative result must be combined with clinical observations, patient history, and epidemiological information. The expected result is Negative.  Fact Sheet for Patients:  BloggerCourse.comhttps://www.fda.gov/media/152166/download  Fact Sheet for Healthcare Providers:  SeriousBroker.ithttps://www.fda.gov/media/152162/download  This test is no t yet approved or cleared by the [place] FDA and  has been authorized for detection and/or diagnosis of SARS-CoV-2 by FDA under an Emergency Use Authorization (EUA). This EUA will remain  in effect (meaning this test can be used) for the duration of the COVID-19 declaration under Section 564(b)(1) of the Act, 21 U.S.C.section 360bbb-3(b)(1), unless the authorization is terminated  or revoked sooner.       Influenza A by PCR NEGATIVE NEGATIVE Final   Influenza B by PCR NEGATIVE NEGATIVE Final    Comment: (NOTE) The Xpert Xpress SARS-CoV-2/FLU/RSV plus assay is intended as an aid in the diagnosis of influenza from Nasopharyngeal swab specimens and should not be used as a sole basis for treatment. Nasal washings and aspirates are unacceptable for Xpert Xpress SARS-CoV-2/FLU/RSV testing.  Fact Sheet for Patients: BloggerCourse.comhttps://www.fda.gov/media/152166/download  Fact Sheet for Healthcare Providers: SeriousBroker.ithttps://www.fda.gov/media/152162/download  This test is not yet approved or cleared by the [place] FDA  and has been authorized for detection and/or diagnosis of SARS-CoV-2 by FDA under an Emergency Use Authorization (EUA). This EUA will remain in effect (meaning this test can be used) for the duration of the COVID-19 declaration under Section 564(b)(1) of the Act, 21 U.S.C. section 360bbb-3(b)(1), unless the authorization is terminated or revoked.  Performed at [facility], [address] [place], [place] [id number]     Lipid Panel No results for input(s): CHOL, TRIG, HDL, CHOLHDL, VLDL, LDLCALC in the last 72 hours.  Studies/Results: MR BRAIN WO CONTRAST  Result Date: 07/09/2021 CLINICAL DATA:  Initial evaluation for acute headache trauma currently pregnant. EXAM: MRI HEAD WITHOUT CONTRAST TECHNIQUE: Multiplanar, multiecho pulse sequences of the brain and surrounding structures were obtained without intravenous contrast. COMPARISON:  Comparison made with prior MRV performed earlier the same day. FINDINGS: Brain: Cerebral volume within normal limits. No focal parenchymal signal abnormality. No abnormal foci of restricted diffusion to suggest acute or subacute ischemia. Gray-white matter differentiation well maintained. No encephalomalacia to suggest chronic cortical infarction. No foci of susceptibility artifact to suggest acute or chronic intracranial hemorrhage. No mass lesion, midline shift or mass effect. Ventricles normal size without hydrocephalus. No extra-axial fluid collection. Pituitary gland somewhat prominent with convex border superiorly, consistent with current pregnancy status. Suprasellar region normal. Midline structures intact and normal. Vascular: Major intracranial vascular flow voids are maintained. Right  V4 segment mildly tortuous and partially invaginating on the adjacent medulla. Skull and upper cervical spine: Craniocervical junction within normal limits. Diffusely decreased T1 signal intensity seen within the visualized bone marrow, likely related to current  pregnancy. No focal marrow replacing lesion. No scalp soft tissue abnormality. Sinuses/Orbits: Globes and orbital soft tissues within normal limits. Mild scattered mucosal thickening noted within the ethmoidal air cells and maxillary sinuses. Paranasal sinuses are otherwise clear. No mastoid effusion. Inner ear structures grossly normal. Other: None. IMPRESSION: Normal brain MRI.  No acute intracranial abnormality identified. Electronically Signed   By: Rise Mu M.D.   On: 07/09/2021 23:52   MR MRV HEAD WO CM  Result Date: 07/09/2021 CLINICAL DATA:  Ongoing headache, concern for dural venous sinus thrombosis EXAM: MR VENOGRAM HEAD WITHOUT CONTRAST TECHNIQUE: Angiographic images of the intracranial venous structures were acquired using MRV technique without intravenous contrast. COMPARISON:  No pertinent prior exam. FINDINGS: There is no evidence of dural venous sinus or deep cerebral vein thrombosis. No dural venous sinus stenosis. IMPRESSION: Negative for venous sinus thrombosis. Electronically Signed   By: Wiliam Ke M.D.   On: 07/09/2021 19:06    Medications: Scheduled:  acetaminophen  1,000 mg Oral Q6H   acetaZOLAMIDE ER  500 mg Oral Q12H   docusate sodium  100 mg Oral Daily   magnesium sulfate  1 g Intravenous Once   prenatal multivitamin  1 tablet Oral Q1200   prenatal multivitamin  1 tablet Oral Q1200   Continuous:  lactated ringers 125 mL/hr at 07/11/21 0345   promethazine (PHENERGAN) injection (IM or IVPB)      Assessment: 30 year old female with pseudotumor cerebri 1. Exam today is nonfocal. Has a holocephalic headache that is throbbing and 7/10. Slight improvement relative to yesterday's headache level of 8-9/10. 2. Opening pressure from LP in the ED was elevated at 24 (normal is <20).  Drained CSF for a closing pressure of 15. 3. MRI brain: Normal brain MRI.  No acute intracranial abnormality identified.  4. MRV brain: Negative for venous sinus thrombosis.    Recommendations: 1. Patient has elected to continue with acetazolamide at 500 mg po BID after being fully informed of risks/benefits to her and the fetus.  2. Consider another dose of IV magnesium sulfate, 1000 mg x 1 3. Can also administer Phenergan 25 mg IV x 1 4. Bolus of NS 1000 mL prior to headache medications 5. Will need outpatient Ophthalmology evaluation for dilated retinal exam and Goldman perimetry. Patient counseled on risks of untreated/unmonitored pseudotumor cerebri, including permanent vision loss.  6. Will need outpatient Neurology follow up. Likely will need repeat LP with opening pressure about 3 months after giving birth to assess for therapeutic effect of acetazolamide.  7. Discussed with the patient and her husband, who expressed understanding and agreement with the plan. All questions answered    LOS: 1 day   @Electronically  signed: Dr. 07/11/2021  10:02 AM

## 2021-07-11 NOTE — Progress Notes (Signed)
Patient rating headache 9/10 again.  Requests pain medication.  Patient thinks Phenergan maybe had the best effect of all the medications received today.  Would like Dilaudid now as that is readily available on the unit and Phenergan was a one-time order this morning.  Phoned Dr. Elon Spanner with above request.  See verbal order for 25mg  Phenergan IV x1.  Will report to night nurse to administer as soon as medication arrives from pharmacy.

## 2021-07-11 NOTE — Progress Notes (Addendum)
Adriana Harrell is a 30 y.o. female patient. [id number] w/pseudotumor cerebri dx on LP. MRI and MRV were WNL. Neurology, Opthamology, and Internal Medicine have all been consulted and are managing her IHT.  Ophto recommended outpatient examination for baseline IIH testing - she is scheduled for Thursday AM. Overnight she only required one dose of IV dilaudid (10pm). She continues scheduled tylenol and has gotten one PO oxycodone this AM. She ate dinner last night and is hungry this am. She has not had any emesis.  Continue Acetazolamide 500mg  BID.  FHT q shift.  Anticipate dc later today after neuro sees her.   1. Idiopathic intracranial hypertension   2. Second trimester pregnancy      Past Medical History:  Diagnosis Date   Migraine     Current Facility-Administered Medications  Medication Dose Route Frequency Provider Last Rate Last Admin   acetaminophen (TYLENOL) tablet 1,000 mg  1,000 mg Oral Q6H , MD   1,000 mg at 07/11/21 0344   acetaZOLAMIDE ER (DIAMOX) 12 hr capsule 500 mg  500 mg Oral 09/10/21, MD   500 mg at 07/10/21 2215   butalbital-acetaminophen-caffeine (FIORICET) 50-325-40 MG per tablet 1-2 tablet  1-2 tablet Oral Q6H PRN 2216, MD   1 tablet at 07/10/21 1451   calcium carbonate (TUMS - dosed in mg elemental calcium) chewable tablet 400 mg of elemental calcium  2 tablet Oral Q4H PRN 09/09/21, MD       docusate sodium (COLACE) capsule 100 mg  100 mg Oral Daily Ranae Pila, MD       HYDROmorphone (DILAUDID) injection 1 mg  1 mg Intravenous Q3H PRN Ranae Pila, MD   1 mg at 07/10/21 2037   lactated ringers infusion   Intravenous Continuous 2038, CNM 125 mL/hr at 07/11/21 0345 New Bag at 07/11/21 0345   metoCLOPramide (REGLAN) 5 MG/5ML solution 10 mg  10 mg Oral Q6H PRN 09/10/21, MD   10 mg at 07/10/21 2016   oxyCODONE (Oxy IR/ROXICODONE) immediate release tablet 5 mg  5 mg  Oral Q4H PRN 2017, MD   5 mg at 07/11/21 0344   prenatal multivitamin tablet 1 tablet  1 tablet Oral Q1200 09/10/21, MD       prenatal multivitamin tablet 1 tablet  1 tablet Oral Q1200 Jonah Blue, MD       zolpidem Ranae Pila) tablet 5 mg  5 mg Oral QHS PRN Remus Loffler, MD   5 mg at 07/10/21 2129   Allergies  Allergen Reactions   Penicillins Hives   Tamiflu [Oseltamivir Phosphate] Hives   Tamiflu [Oseltamivir] Hives   Principal Problem:   Pseudotumor cerebri Active Problems:   Supervision of normal IUP (intrauterine pregnancy) in multigravida, unspecified trimester   Pseudotumor  Blood pressure (!) 98/55, pulse 76, temperature 97.7 F (36.5 C), temperature source Temporal, resp. rate 17, height 5\' 2"  (1.575 m), weight 67.1 kg, last menstrual period 12/11/2020, SpO2 100 %.  Review of Systems  Physical Exam NAD, A&O NWOB Abd soft, nondistended, gravid    07/11/2021

## 2021-07-12 DIAGNOSIS — Z3A28 28 weeks gestation of pregnancy: Secondary | ICD-10-CM | POA: Diagnosis not present

## 2021-07-12 DIAGNOSIS — R519 Headache, unspecified: Secondary | ICD-10-CM | POA: Diagnosis not present

## 2021-07-12 DIAGNOSIS — Z20822 Contact with and (suspected) exposure to covid-19: Secondary | ICD-10-CM | POA: Diagnosis present

## 2021-07-12 DIAGNOSIS — O99283 Endocrine, nutritional and metabolic diseases complicating pregnancy, third trimester: Secondary | ICD-10-CM | POA: Diagnosis present

## 2021-07-12 DIAGNOSIS — G932 Benign intracranial hypertension: Secondary | ICD-10-CM | POA: Diagnosis present

## 2021-07-12 DIAGNOSIS — Z87891 Personal history of nicotine dependence: Secondary | ICD-10-CM | POA: Diagnosis not present

## 2021-07-12 DIAGNOSIS — Z79899 Other long term (current) drug therapy: Secondary | ICD-10-CM | POA: Diagnosis not present

## 2021-07-12 DIAGNOSIS — E876 Hypokalemia: Secondary | ICD-10-CM | POA: Diagnosis present

## 2021-07-12 DIAGNOSIS — O99353 Diseases of the nervous system complicating pregnancy, third trimester: Secondary | ICD-10-CM | POA: Diagnosis present

## 2021-07-12 LAB — COMPREHENSIVE METABOLIC PANEL
ALT: 20 U/L (ref 0–44)
AST: 19 U/L (ref 15–41)
Albumin: 2.2 g/dL — ABNORMAL LOW (ref 3.5–5.0)
Alkaline Phosphatase: 85 U/L (ref 38–126)
Anion gap: 5 (ref 5–15)
BUN: 6 mg/dL (ref 6–20)
CO2: 18 mmol/L — ABNORMAL LOW (ref 22–32)
Calcium: 8.3 mg/dL — ABNORMAL LOW (ref 8.9–10.3)
Chloride: 114 mmol/L — ABNORMAL HIGH (ref 98–111)
Creatinine, Ser: 0.62 mg/dL (ref 0.44–1.00)
GFR, Estimated: >60 mL/min (ref >60)
Glucose, Bld: 85 mg/dL (ref 70–99)
Potassium: 3.4 mmol/L — ABNORMAL LOW (ref 3.5–5.1)
Sodium: 137 mmol/L (ref 135–145)
Total Bilirubin: 0.4 mg/dL (ref 0.3–1.2)
Total Protein: 5.1 g/dL — ABNORMAL LOW (ref 6.5–8.1)

## 2021-07-12 LAB — CBC
HCT: 26.6 % — ABNORMAL LOW (ref 36.0–46.0)
Hemoglobin: 8.7 g/dL — ABNORMAL LOW (ref 12.0–15.0)
MCH: 28.2 pg (ref 26.0–34.0)
MCHC: 32.7 g/dL (ref 30.0–36.0)
MCV: 86.1 fL (ref 80.0–100.0)
Platelets: 215 10*3/uL (ref 150–400)
RBC: 3.09 MIL/uL — ABNORMAL LOW (ref 3.87–5.11)
RDW: 13.6 % (ref 11.5–15.5)
WBC: 9.3 10*3/uL (ref 4.0–10.5)
nRBC: 0 % (ref 0.0–0.2)

## 2021-07-12 LAB — MAGNESIUM: Magnesium: 1.6 mg/dL — ABNORMAL LOW (ref 1.7–2.4)

## 2021-07-12 MED ORDER — ACETAZOLAMIDE ER 500 MG PO CP12
1000.0000 mg | ORAL_CAPSULE | Freq: Two times a day (BID) | ORAL | Status: DC
  Administered 2021-07-12 – 2021-07-15 (×6): 1000 mg via ORAL
  Filled 2021-07-12 (×7): qty 2

## 2021-07-12 MED ORDER — MAGNESIUM SULFATE 2 GM/50ML IV SOLN
2.0000 g | Freq: Once | INTRAVENOUS | Status: AC
  Administered 2021-07-12: 2 g via INTRAVENOUS
  Filled 2021-07-12: qty 50

## 2021-07-12 MED ORDER — SODIUM CHLORIDE 0.9 % IV SOLN
25.0000 mg | Freq: Four times a day (QID) | INTRAVENOUS | Status: DC | PRN
  Administered 2021-07-12 – 2021-07-18 (×22): 25 mg via INTRAVENOUS
  Filled 2021-07-12 (×24): qty 1

## 2021-07-12 MED ORDER — POTASSIUM CHLORIDE CRYS ER 20 MEQ PO TBCR
40.0000 meq | EXTENDED_RELEASE_TABLET | Freq: Once | ORAL | Status: AC
  Administered 2021-07-12: 40 meq via ORAL
  Filled 2021-07-12: qty 2

## 2021-07-12 NOTE — Progress Notes (Signed)
Neurology Progress Note  Brief HPI: 30 y.o. 85wk 4day pregnant female with PMHx of migraines previously on Topamax without recurrence in over one decade who presented to the ED 9/9 for evaluation of unremitting headaches for one week that are holocephalic, throbbing, with visual disturbance initially described as grainy vision OD, worse with laying down and bending, and with associated nausea and photophobia. MRI and MRV head imaging were negative. Headache without improvement with medications including Fioricet, Reglan, Solu-Medrol, magnesium, cyclobenzaprine, benadryl, and Zofran. LP was performed in the ED with elevated opening pressure of 24 with some headache relief following CSF drainage to closing pressure of 15 and improvement of vision, c/w IIH.   Subjective: Patient endorses headache today that is worse than yesterday. She states her headache is holocephalic with a 9-10/10 severity, throbbing, with photophobia, neck pain with movement, intermittent blurry vision bilaterally, and nausea. She states her headache is worse with position changes, laying flat, standing, and bending.  She states that her headache initially improved slightly following LP with CSF drainage and her grainy vision OD improved but since then, her headache has gradually worsened with blurry vision bilaterally.  She states that she felt brief relief with HOB at 15 degrees following phenergan and magnesium but that the relief was brief and her headache did return.  She complains of neck pain with movement but does not endorse pain with palpation of her neck. There is no change in her headache with palpation of her scalp throughout.   Exam: Vitals:   07/12/21 0351 07/12/21 0824  BP: (!) 101/48 (!) 102/53  Pulse: 71 78  Resp: 18 20  Temp: 98.3 F (36.8 C) 98.1 F (36.7 C)  SpO2: 98% 100%   Gen: Sitting up in bed, eating breakfast, appears uncomfortable and in pain, she is tearful throughout the assessment with ongoing  headache Resp: non-labored breathing, no respiratory distress on room air Abd: rounded, non-tender  Neuro: Mental Status: Awake, alert, and oriented.  Speech is intact without dysarthria or aphasia.  She follows commands and is able to provide a clear and coherent history of present illness.  No neglect noted Cranial Nerves: PERRL, EOMI, visual fields are full with some intermittent c/o blurry vision bilaterally, facial sensation is intact and symmetric to light touch, face is symmetric resting and with movement, hearing is intact to voice, phonation normal, tongue is midline.  Motor: Moves all extremities spontaneously and with antigravity movement without asymmetry.  Tone and bulk are normal. Sensory: Sensation to light touch is intact and symmetric in upper and lower extremities. DTR: 3+ and symmetric patellae, 2+ and symmetric biceps Gait: Deferred  Pertinent Labs: CBC    Component Value Date/Time   WBC 9.3 07/12/2021 0538   RBC 3.09 (L) 07/12/2021 0538   HGB 8.7 (L) 07/12/2021 0538   HGB 15.0 09/15/2012 1556   HCT 26.6 (L) 07/12/2021 0538   HCT 43.4 09/15/2012 1556   PLT 215 07/12/2021 0538   PLT 313 09/15/2012 1556   MCV 86.1 07/12/2021 0538   MCV 85 09/15/2012 1556   MCH 28.2 07/12/2021 0538   MCHC 32.7 07/12/2021 0538   RDW 13.6 07/12/2021 0538   RDW 13.0 09/15/2012 1556   LYMPHSABS 2.4 05/19/2021 2047   MONOABS 0.8 05/19/2021 2047   EOSABS 0.2 05/19/2021 2047   BASOSABS 0.0 05/19/2021 2047   CMP     Component Value Date/Time   NA 137 07/12/2021 0538   NA 138 09/15/2012 1556   K 3.4 (L) 07/12/2021 2595  K 4.0 09/15/2012 1556   CL 114 (H) 07/12/2021 0538   CL 107 09/15/2012 1556   CO2 18 (L) 07/12/2021 0538   CO2 22 09/15/2012 1556   GLUCOSE 85 07/12/2021 0538   GLUCOSE 90 09/15/2012 1556   BUN 6 07/12/2021 0538   BUN 8 09/15/2012 1556   CREATININE 0.62 07/12/2021 0538   CREATININE 0.65 09/15/2012 1556   CALCIUM 8.3 (L) 07/12/2021 0538   CALCIUM 9.1  09/15/2012 1556   PROT 5.1 (L) 07/12/2021 0538   PROT 8.7 (H) 09/15/2012 1556   ALBUMIN 2.2 (L) 07/12/2021 0538   ALBUMIN 4.5 09/15/2012 1556   AST 19 07/12/2021 0538   AST 23 09/15/2012 1556   ALT 20 07/12/2021 0538   ALT 18 09/15/2012 1556   ALKPHOS 85 07/12/2021 0538   ALKPHOS 122 09/15/2012 1556   BILITOT 0.4 07/12/2021 0538   BILITOT 0.5 09/15/2012 1556   GFRNONAA >60 07/12/2021 0538   GFRNONAA >60 09/15/2012 1556   GFRAA >60 09/14/2019 0755   GFRAA >60 09/15/2012 1556    Ref. Range 07/10/2021 06:20 07/10/2021 06:20  Appearance, CSF Latest Ref Range: CLEAR  CLEAR CLEAR  Glucose, CSF Latest Ref Range: 40 - 70 mg/dL 57   RBC Count, CSF Latest Ref Range: 0 /cu mm 0 0  WBC, CSF Latest Ref Range: 0 - 5 /cu mm 1 1  Other Cells, CSF Unknown TOO FEW TO COUNT, SMEAR AVAILABLE FOR REVIEW TOO FEW TO COUNT, SMEAR AVAILABLE FOR REVIEW  Color, CSF Latest Ref Range: COLORLESS  COLORLESS COLORLESS  Supernatant Unknown NOT INDICATED NOT INDICATED  Total  Protein, CSF Latest Ref Range: 15 - 45 mg/dL 21   Tube # Unknown 4 3   Gram Stain CYTOSPIN SMEAR  NO WBC SEEN  NO ORGANISMS SEEN   Culture NO GROWTH 2 DAYS    Imaging Reviewed:  MRV head wo contrast 07/09/2021: Negative for venous sinus thrombosis.  MRI brain wo contrast 07/09/2021: Normal brain MRI.  No acute intracranial abnormality identified.  Assessment: 30 year old female with pseudotumor cerebri with constant headaches 1. Exam today is nonfocal. Has a holocephalic headache that is throbbing and 9-10/10 that is worse from reported headache yesterday. She also endorses neck pain with movement without tenderness to palpation and endorses intermittent blurry vision OU.  2. Opening pressure from LP in the ED was elevated at 24 (normal is <20).  Drained CSF for a closing pressure of 15. CSF studies thus far are unremarkable.  3. MRI brain: Normal brain MRI.  No acute intracranial abnormality identified.  4. MRV brain: Negative for venous  sinus thrombosis. 5. Ophthalmology evaluation 9/9 without florid disc edema, however, within the limitation of indirect ophthalmoscopy with limited magnification and visualization. She has plans to follow up outpatient.  6. Patient reports some brief headache improvement following magnesium sulfate and phenergan administration yesterday but feels that since she does not have adequate scheduled headache relief, her headache returned.   Recommendations: 1. Would increase acetazolamide to 1,000 mg po BID for ongoing concern for IIH headache   2. Discussed the need for potential repeat LP but would prefer to conservatively manage at this time if possible to avoid risks associated with serial lumbar punctures, including increased risk of introduction of infectious material or bleeding into the CSF space with serial LP. Will reconsider therapeutic LP with CSF drainage and repeat opening pressures if increasing acetazolamide dose does not improve patient symptoms.  3. Has scheduled outpatient follow up with ophthalmology for  baseline visual field and RNFL testing. Patient counseled on risks of untreated/unmonitored pseudotumor cerebri, including permanent vision loss.  4. Will need outpatient Neurology follow up with Dr. Lucia Gaskins with [facility]. Likely will need repeat LP with opening pressure about 3 months after giving birth to assess for therapeutic effect of acetazolamide.   Adriana Harrell, AGACNP-BC [facility] [phone]  Electronically signed: Dr. Caryl Pina

## 2021-07-12 NOTE — Progress Notes (Addendum)
PROGRESS NOTE  Adriana Harrell ZDG:[id number] DOB: 1991-02-03 DOA: 07/09/2021 PCP: Lance Morin, MD  Outpatient Specialists: [facility]      Requesting physician: [facility]   Reason for consultation: Stroke r/o - MRI, MRV negative.  Given Fiorcet and opioids without improvement.  Opening pressure 24, removed 8 cc fluid.  Ophthalmology with see her to evaluate for papilledema.  She is recommended to have acetazolamide (category C).  May need serial LP.  Neuro and OB/GYN will discuss further therapies for intractable headache.               History of Present Illness: Adriana Harrell is an 30 y.o. female G4P2 at 79 weeks with headache.  She reports an uncomplicated pregnancy and has a 5 and 32 yo at home with also uncomplicated pregnancies.  She had migraines in her late teens/early 55s but none since.  She developed a headache that is all over her head about 1 week ago.  Nothing seems to help the headache and the pain worsened yesterday.  She is having R blurry vision.  Nothing seems to make it better.   HPI/Recap of past 24 hours: 07/12/21: Patient was seen at her bedside.  She reports her headache is about the same as it was yesterday.  It is worse with ambulation and with movements.    She is currently on acetazolamide 1000 mg twice daily, the dose was increased this morning by neurologist recommendation, and promethazine 25 mg IV every 6 hours as needed for nausea.  She understands the risk and the benefits of the treatment after being fully informed of the risks/benefits to her and the fetus and would like to continue.  Assessment/Plan: Principal Problem:   Pseudotumor cerebri Active Problems:   Supervision of normal IUP (intrauterine pregnancy) in multigravida, unspecified trimester   Pseudotumor   Pregnancy  Pseudotumor cerebrii Still with headache 7/10 Opening pressure 24, normal less than 20. Seen by neurology on 9/10, she will need outpatient follow-up with neurology  to repeat lumbar puncture. She also needs outpatient ophthalmology evaluation for dilated retinal exam.  Patient understands and agrees with this plan. On Diamox 1000 mg BID, dose increased on 9/11 and neurologist recommendation. Unremarkable labs and MRI -She may also need opioids for treatment of her headache pain -The patient understands the risks associated with these medications in pregnancy and wishes to proceed.  Electrolyte disturbances Serum potassium 3.4 repleted Serum magnesium 1.6, repleted  Nausea in pregnancy On IV Phenergan 25 mg every 6 hours as needed  Anemia of pregnancy Drop in hemoglobin this morning down to 8.7 from 10.2, suspect dilutional in the setting of IV fluid hydration No overt bleeding Continue prenatal vitamins and continue to monitor   Normal IUP -Admitted to OB/GYN service -Management of pregnancy per OB   Thank you for the consult.  We will continue to follow with you.   Objective: Vitals:   07/11/21 2315 07/12/21 0351 07/12/21 0824 07/12/21 1143  BP: (!) 116/57 (!) 101/48 (!) 102/53 (!) 111/56  Pulse: 72 71 78 69  Resp: 16 18 20 18   Temp: 97.8 F (36.6 C) 98.3 F (36.8 C) 98.1 F (36.7 C) 98.3 F (36.8 C)  TempSrc: Oral Oral Axillary Oral  SpO2: 100% 98% 100% 100%  Weight:      Height:        Intake/Output Summary (Last 24 hours) at 07/12/2021 1225 Last data filed at 07/12/2021 1100 Gross per 24 hour  Intake  3065.06 ml  Output --  Net 3065.06 ml   Filed Weights   07/10/21 2100  Weight: 67.1 kg    Exam:  General: 30 y.o. year-old female well-developed well-nourished in no acute distress.  She is alert and oriented x3.   Cardiovascular: Regular rate and rhythm with no rubs or gallops.  No JVD or thyromegaly noted.   Respiratory: Clear to auscultation with no wheezes or rales.   Abdomen: Gravid abdomen bowel sounds present.   Musculoskeletal: No lower extremity edema bilaterally. Skin: No ulcerative lesions noted.    Psychiatry: Mood is appropriate for condition and setting.   Data Reviewed: CBC: Recent Labs  Lab 07/09/21 1539 07/12/21 0538  WBC 12.5* 9.3  HGB 10.2* 8.7*  HCT 31.1* 26.6*  MCV 85.4 86.1  PLT 255 215   Basic Metabolic Panel: Recent Labs  Lab 07/09/21 1539 07/12/21 0538  NA 136 137  K 3.4* 3.4*  CL 106 114*  CO2 23 18*  GLUCOSE 81 85  BUN <5* 6  CREATININE 0.51 0.62  CALCIUM 8.4* 8.3*  MG  --  1.6*   GFR: Estimated Creatinine Clearance: 90.7 mL/min (by C-G formula based on SCr of 0.62 mg/dL). Liver Function Tests: Recent Labs  Lab 07/09/21 1539 07/12/21 0538  AST 34 19  ALT 30 20  ALKPHOS 113 85  BILITOT 0.3 0.4  PROT 6.0* 5.1*  ALBUMIN 2.5* 2.2*   No results for input(s): LIPASE, AMYLASE in the last 168 hours. No results for input(s): AMMONIA in the last 168 hours. Coagulation Profile: No results for input(s): INR, PROTIME in the last 168 hours. Cardiac Enzymes: No results for input(s): CKTOTAL, CKMB, CKMBINDEX, TROPONINI in the last 168 hours. BNP (last 3 results) No results for input(s): PROBNP in the last 8760 hours. HbA1C: No results for input(s): HGBA1C in the last 72 hours. CBG: No results for input(s): GLUCAP in the last 168 hours. Lipid Profile: No results for input(s): CHOL, HDL, LDLCALC, TRIG, CHOLHDL, LDLDIRECT in the last 72 hours. Thyroid Function Tests: No results for input(s): TSH, T4TOTAL, FREET4, T3FREE, THYROIDAB in the last 72 hours. Anemia Panel: No results for input(s): VITAMINB12, FOLATE, FERRITIN, TIBC, IRON, RETICCTPCT in the last 72 hours. Urine analysis:    Component Value Date/Time   COLORURINE YELLOW 07/09/2021 1452   APPEARANCEUR CLEAR 07/09/2021 1452   APPEARANCEUR Hazy 09/15/2012 1556   LABSPEC <1.005 (L) 07/09/2021 1452   LABSPEC 1.043 09/15/2012 1556   PHURINE 7.0 07/09/2021 1452   GLUCOSEU NEGATIVE 07/09/2021 1452   GLUCOSEU Negative 09/15/2012 1556   HGBUR NEGATIVE 07/09/2021 1452   BILIRUBINUR NEGATIVE  07/09/2021 1452   BILIRUBINUR Negative 09/15/2012 1556   KETONESUR NEGATIVE 07/09/2021 1452   PROTEINUR NEGATIVE 07/09/2021 1452   NITRITE NEGATIVE 07/09/2021 1452   LEUKOCYTESUR NEGATIVE 07/09/2021 1452   LEUKOCYTESUR Negative 09/15/2012 1556   Sepsis Labs: @LABRCNTIP (procalcitonin:4,lacticidven:4)  ) Recent Results (from the past 240 hour(s))  CSF culture w Gram Stain     Status: None (Preliminary result)   Collection Time: 07/10/21  6:21 AM   Specimen: CSF; Cerebrospinal Fluid  Result Value Ref Range Status   Specimen Description CSF  Final   Special Requests NONE  Final   Gram Stain CYTOSPIN SMEAR NO WBC SEEN NO ORGANISMS SEEN   Final   Culture   Final    NO GROWTH 2 DAYS Performed at [facility], [address] [place], [place]    Report Status PENDING  Incomplete  Resp Panel by  RT-PCR (Flu A&B, Covid) Nasopharyngeal Swab     Status: None   Collection Time: 07/10/21  3:45 PM   Specimen: Nasopharyngeal Swab; Nasopharyngeal(NP) swabs in vial transport medium  Result Value Ref Range Status   SARS Coronavirus 2 by RT PCR NEGATIVE NEGATIVE Final    Comment: (NOTE) SARS-CoV-2 target nucleic acids are NOT DETECTED.  The SARS-CoV-2 RNA is generally detectable in upper respiratory specimens during the acute phase of infection. The lowest concentration of SARS-CoV-2 viral copies this assay can detect is 138 copies/mL. A negative result does not preclude SARS-Cov-2 infection and should not be used as the sole basis for treatment or other patient management decisions. A negative result may occur with  improper specimen collection/handling, submission of specimen other than nasopharyngeal swab, presence of viral mutation(s) within the areas targeted by this assay, and inadequate number of viral copies(<138 copies/mL). A negative result must be combined with clinical observations, patient history, and epidemiological information. The expected result is  Negative.  Fact Sheet for Patients:  BloggerCourse.com  Fact Sheet for Healthcare Providers:  SeriousBroker.it  This test is no t yet approved or cleared by the [place] FDA and  has been authorized for detection and/or diagnosis of SARS-CoV-2 by FDA under an Emergency Use Authorization (EUA). This EUA will remain  in effect (meaning this test can be used) for the duration of the COVID-19 declaration under Section 564(b)(1) of the Act, 21 U.S.C.section 360bbb-3(b)(1), unless the authorization is terminated  or revoked sooner.       Influenza A by PCR NEGATIVE NEGATIVE Final   Influenza B by PCR NEGATIVE NEGATIVE Final    Comment: (NOTE) The Xpert Xpress SARS-CoV-2/FLU/RSV plus assay is intended as an aid in the diagnosis of influenza from Nasopharyngeal swab specimens and should not be used as a sole basis for treatment. Nasal washings and aspirates are unacceptable for Xpert Xpress SARS-CoV-2/FLU/RSV testing.  Fact Sheet for Patients: BloggerCourse.com  Fact Sheet for Healthcare Providers: SeriousBroker.it  This test is not yet approved or cleared by the [place] FDA and has been authorized for detection and/or diagnosis of SARS-CoV-2 by FDA under an Emergency Use Authorization (EUA). This EUA will remain in effect (meaning this test can be used) for the duration of the COVID-19 declaration under Section 564(b)(1) of the Act, 21 U.S.C. section 360bbb-3(b)(1), unless the authorization is terminated or revoked.  Performed at [facility], [address] [place], [place] [address]       Studies: No results found.  Scheduled Meds:  acetaminophen  1,000 mg Oral Q6H   acetaZOLAMIDE ER  1,000 mg Oral Q12H   docusate sodium  100 mg Oral Daily   prenatal multivitamin  1 tablet Oral Q1200   prenatal multivitamin  1 tablet Oral Q1200    Continuous  Infusions:  lactated ringers 125 mL/hr at 07/12/21 1135   promethazine (PHENERGAN) injection (IM or IVPB) 25 mg (07/12/21 0908)     LOS: 1 day     Darlin Drop, MD [facility] Pager [phone]  If 7PM-7AM, please contact night-coverage [email or web address] Password [facility] 07/12/2021, 12:25 PM

## 2021-07-12 NOTE — Progress Notes (Signed)
Adriana Harrell is a 30 y.o. female patient. Overnight, had control of HA with promethazine 25mg  IV. Has required multiple doses of dilaudid. Still feels 7/10 HA.  She had a nose bleed and was unsure if blood was from wiping nose of vagina on TP.   w/pseudotumor cerebri dx on LP.  MRI and MRV were WNL. Neurology, Opthamology, and Internal Medicine have all been consulted and are managing her IHT.  Neuro recommends increasing Acetazolamide to 1000mg  BID.  Promethazine 25mg  IV prn q 6 hours FHT q shift and NST daily given need for narcotic use.  1. Idiopathic intracranial hypertension   2. Second trimester pregnancy      Past Medical History:  Diagnosis Date   Migraine     Current Facility-Administered Medications  Medication Dose Route Frequency Provider Last Rate Last Admin   acetaminophen (TYLENOL) tablet 1,000 mg  1,000 mg Oral Q6H [id number], MD   1,000 mg at 07/11/21 2134   acetaZOLAMIDE ER (DIAMOX) 12 hr capsule 500 mg  500 mg Oral Ranae Pila, MD   500 mg at 07/11/21 2247   butalbital-acetaminophen-caffeine (FIORICET) 50-325-40 MG per tablet 1-2 tablet  1-2 tablet Oral Q6H PRN 2135, MD   2 tablet at 07/12/21 0349   calcium carbonate (TUMS - dosed in mg elemental calcium) chewable tablet 400 mg of elemental calcium  2 tablet Oral Q4H PRN 09/10/21, MD       docusate sodium (COLACE) capsule 100 mg  100 mg Oral Daily Jonah Blue, MD   100 mg at 07/11/21 1010   HYDROmorphone (DILAUDID) injection 1 mg  1 mg Intravenous Q3H PRN Ranae Pila, MD   1 mg at 07/12/21 09/10/21   lactated ringers infusion   Intravenous Continuous Ranae Pila, CNM 125 mL/hr at 07/12/21 0345 New Bag at 07/12/21 0345   oxyCODONE (Oxy IR/ROXICODONE) immediate release tablet 5 mg  5 mg Oral Q4H PRN Brand Males, MD   5 mg at 07/12/21 09/11/21   prenatal multivitamin tablet 1 tablet  1 tablet Oral Q1200 Jonah Blue, MD       prenatal  multivitamin tablet 1 tablet  1 tablet Oral Q1200 09/11/21, MD   1 tablet at 07/11/21 1527   promethazine (PHENERGAN) 25 mg in sodium chloride 0.9 % 50 mL IVPB  25 mg Intravenous Q6H PRN Jonah Blue, MD       zolpidem Ranae Pila) tablet 5 mg  5 mg Oral QHS PRN 09/10/21, MD   5 mg at 07/10/21 2129   Allergies  Allergen Reactions   Penicillins Hives   Tamiflu [Oseltamivir Phosphate] Hives   Tamiflu [Oseltamivir] Hives   Principal Problem:   Pseudotumor cerebri Active Problems:   Supervision of normal IUP (intrauterine pregnancy) in multigravida, unspecified trimester   Pseudotumor   Pregnancy  Blood pressure (!) 102/53, pulse 78, temperature 98.1 F (36.7 C), temperature source Axillary, resp. rate 20, height 5\' 2"  (1.575 m), weight 67.1 kg, last menstrual period 12/11/2020, SpO2 100 %.  Review of Systems  Physical Exam NAD, A&O NWOB Abd soft, nondistended, gravid    09/09/21 07/12/2021

## 2021-07-13 LAB — CSF CULTURE W GRAM STAIN: Culture: NO GROWTH

## 2021-07-13 LAB — MAGNESIUM: Magnesium: 1.7 mg/dL (ref 1.7–2.4)

## 2021-07-13 LAB — POTASSIUM: Potassium: 3.3 mmol/L — ABNORMAL LOW (ref 3.5–5.1)

## 2021-07-13 MED ORDER — POTASSIUM CHLORIDE 10 MEQ/100ML IV SOLN
10.0000 meq | INTRAVENOUS | Status: DC
  Administered 2021-07-13: 10 meq via INTRAVENOUS
  Filled 2021-07-13: qty 100

## 2021-07-13 MED ORDER — POTASSIUM CHLORIDE 10 MEQ/100ML IV SOLN
10.0000 meq | Freq: Two times a day (BID) | INTRAVENOUS | Status: AC
  Administered 2021-07-13 (×2): 10 meq via INTRAVENOUS
  Filled 2021-07-13 (×2): qty 100

## 2021-07-13 NOTE — Progress Notes (Signed)
Patient ID: Adriana Harrell, female   DOB: 1991/01/12, 30 y.o.   MRN: [id number] [name] continues to complain of global headache with some improvement with Phenergan and Dilaudid but only makes it tolerable  VSSAF FHR 140s with accels and no decels  Abd Gravid nt Neg homans  IUP at 28 6/7 Headache management - Neurology, Opthamology, and Internal Medicine have all been consulted and are managing her IHT.  Pseudotumor cerebri dx on LP.  MRI and MRV were WNL.  Acetazolamide to 1000mg  BID Promethazine 25mg  IV prn q 6 hours and dilaudid prn (could consider fentanyl), and fioricet prn Fetal well being - NSTs q shift are reassuring. DL

## 2021-07-13 NOTE — Progress Notes (Signed)
PROGRESS NOTE  Adriana Harrell BJY:[id number] DOB: 1991-07-29 DOA: 07/09/2021 PCP: Lance Morin, MD  Outpatient Specialists: [facility] - OB/GYN      Requesting physician: [facility] Spanner - OB/GYN   Reason for consultation: Stroke r/o - MRI, MRV negative.  Given Fiorcet and opioids without improvement.  Opening pressure 24, removed 8 cc fluid.  Ophthalmology with see her to evaluate for papilledema.  She is recommended to have acetazolamide (category C).  May need serial LP.  Neuro and OB/GYN will discuss further therapies for intractable headache.               History of Present Illness: Adriana Harrell is an 30 y.o. female G4P2 at 10 weeks with headache.  She reports an uncomplicated pregnancy and has a 5 and 32 yo at home with also uncomplicated pregnancies.  She had migraines in her late teens/early 13s but none since.  She developed a headache that is all over her head about 1 week ago.  Nothing seems to help the headache and the pain worsened yesterday.  She is having R blurry vision.  Nothing seems to make it better.   HPI/Recap of past 24 hours:  She is currently on acetazolamide 1000 mg twice daily, the dose was increased this morning by neurologist recommendation, and promethazine 25 mg IV every 6 hours as needed for nausea.  She understands the risk and the benefits of the treatment after being fully informed of the risks/benefits to her and the fetus and would like to continue.  07/13/2021: Patient was seen at her bedside.  She stated her headache was 10 out of 10.  Her husband was present.  Her dose of acetazolamide was increased to 1000 mg twice daily per neurology.  She is also on Dilaudid as needed and Fioricet as needed.  Assessment/Plan: Principal Problem:   Pseudotumor cerebri Active Problems:   Supervision of normal IUP (intrauterine pregnancy) in multigravida, unspecified trimester   Pseudotumor   Pregnancy  Persistent headache secondary to seudotumor cerebrii Still with headache  10/10 Opening pressure 24, normal less than 20, was reduced to 15 after LP. Seen by neurology on 9/10 and 9/11, she will need outpatient follow-up with neurology to repeat lumbar puncture.  Possible repeat lumbar puncture while in the hospital if increased dose of acetazolamide not helpful.  She also needs outpatient ophthalmology evaluation for dilated retinal exam.  Patient understands and agrees with this plan. Currently on Diamox 1000 mg BID, dose increased on 9/11 per neurologist recommendation. Unremarkable labs and MRI -She may also need opioids for treatment of her headache pain -The patient understands the risks associated with these medications in pregnancy and wishes to proceed.  Electrolyte disturbances Serum potassium 3.4 repleted Serum magnesium 1.6, repleted  Refractory hypokalemia Potassium 3.3 Repleted intravenously. Repeat BMP in the morning  Nausea in pregnancy On IV Phenergan 25 mg every 6 hours as needed  Anemia of pregnancy Drop in hemoglobin this morning down to 8.7 from 10.2, suspect dilutional in the setting of IV fluid hydration No overt bleeding Continue prenatal vitamins and continue to monitor Obtain CBC in the morning   Normal IUP -Admitted to OB/GYN service -Management of pregnancy per OB   Thank you for the consult.  We will continue to follow with you.   Objective: Vitals:   07/13/21 0455 07/13/21 0750 07/13/21 1207 07/13/21 1517  BP: (!) 94/43 107/61 (!) 97/46 (!) 89/44  Pulse: 72 68 77 68  Resp: 16 16 16  16  Temp: 98.2 F (36.8 C) 98.2 F (36.8 C) (!) 97.4 F (36.3 C) 98.3 F (36.8 C)  TempSrc: Oral Oral Oral Oral  SpO2: 97% 100% 99% 98%  Weight:      Height:        Intake/Output Summary (Last 24 hours) at 07/13/2021 1530 Last data filed at 07/13/2021 1500 Gross per 24 hour  Intake --  Output 300 ml  Net -300 ml   Filed Weights   07/10/21 2100  Weight: 67.1 kg    Exam:  General: 30 y.o. year-old female well-developed  well-nourished in no acute distress.  She is alert and oriented x3.  Uncomfortable due to headache. Cardiovascular: Regular rate and rhythm no rubs gallops. Respiratory: Clear to auscultation with no wheezes or rales. Abdomen: Gravid abdomen bowel sounds present. Musculoskeletal: No lower extremity edema bilaterally.   Skin: No ulcerative lesions noted. Psychiatry: Mood is appropriate for condition and setting.   Data Reviewed: CBC: Recent Labs  Lab 07/09/21 1539 07/12/21 0538  WBC 12.5* 9.3  HGB 10.2* 8.7*  HCT 31.1* 26.6*  MCV 85.4 86.1  PLT 255 215   Basic Metabolic Panel: Recent Labs  Lab 07/09/21 1539 07/12/21 0538 07/13/21 0744  NA 136 137  --   K 3.4* 3.4* 3.3*  CL 106 114*  --   CO2 23 18*  --   GLUCOSE 81 85  --   BUN <5* 6  --   CREATININE 0.51 0.62  --   CALCIUM 8.4* 8.3*  --   MG  --  1.6* 1.7   GFR: Estimated Creatinine Clearance: 90.7 mL/min (by C-G formula based on SCr of 0.62 mg/dL). Liver Function Tests: Recent Labs  Lab 07/09/21 1539 07/12/21 0538  AST 34 19  ALT 30 20  ALKPHOS 113 85  BILITOT 0.3 0.4  PROT 6.0* 5.1*  ALBUMIN 2.5* 2.2*   No results for input(s): LIPASE, AMYLASE in the last 168 hours. No results for input(s): AMMONIA in the last 168 hours. Coagulation Profile: No results for input(s): INR, PROTIME in the last 168 hours. Cardiac Enzymes: No results for input(s): CKTOTAL, CKMB, CKMBINDEX, TROPONINI in the last 168 hours. BNP (last 3 results) No results for input(s): PROBNP in the last 8760 hours. HbA1C: No results for input(s): HGBA1C in the last 72 hours. CBG: No results for input(s): GLUCAP in the last 168 hours. Lipid Profile: No results for input(s): CHOL, HDL, LDLCALC, TRIG, CHOLHDL, LDLDIRECT in the last 72 hours. Thyroid Function Tests: No results for input(s): TSH, T4TOTAL, FREET4, T3FREE, THYROIDAB in the last 72 hours. Anemia Panel: No results for input(s): VITAMINB12, FOLATE, FERRITIN, TIBC, IRON,  RETICCTPCT in the last 72 hours. Urine analysis:    Component Value Date/Time   COLORURINE YELLOW 07/09/2021 1452   APPEARANCEUR CLEAR 07/09/2021 1452   APPEARANCEUR Hazy 09/15/2012 1556   LABSPEC <1.005 (L) 07/09/2021 1452   LABSPEC 1.043 09/15/2012 1556   PHURINE 7.0 07/09/2021 1452   GLUCOSEU NEGATIVE 07/09/2021 1452   GLUCOSEU Negative 09/15/2012 1556   HGBUR NEGATIVE 07/09/2021 1452   BILIRUBINUR NEGATIVE 07/09/2021 1452   BILIRUBINUR Negative 09/15/2012 1556   KETONESUR NEGATIVE 07/09/2021 1452   PROTEINUR NEGATIVE 07/09/2021 1452   NITRITE NEGATIVE 07/09/2021 1452   LEUKOCYTESUR NEGATIVE 07/09/2021 1452   LEUKOCYTESUR Negative 09/15/2012 1556   Sepsis Labs: @LABRCNTIP (procalcitonin:4,lacticidven:4)  ) Recent Results (from the past 240 hour(s))  CSF culture w Gram Stain     Status: None   Collection Time: 07/10/21  6:21 AM  Specimen: CSF; Cerebrospinal Fluid  Result Value Ref Range Status   Specimen Description CSF  Final   Special Requests NONE  Final   Gram Stain CYTOSPIN SMEAR NO WBC SEEN NO ORGANISMS SEEN   Final   Culture   Final    NO GROWTH 3 DAYS Performed at [facility], [address], [place], [place] [address]    Report Status 07/13/2021 FINAL  Final  Resp Panel by RT-PCR (Flu A&B, Covid) Nasopharyngeal Swab     Status: None   Collection Time: 07/10/21  3:45 PM   Specimen: Nasopharyngeal Swab; Nasopharyngeal(NP) swabs in vial transport medium  Result Value Ref Range Status   SARS Coronavirus 2 by RT PCR NEGATIVE NEGATIVE Final    Comment: (NOTE) SARS-CoV-2 target nucleic acids are NOT DETECTED.  The SARS-CoV-2 RNA is generally detectable in upper respiratory specimens during the acute phase of infection. The lowest concentration of SARS-CoV-2 viral copies this assay can detect is 138 copies/mL. A negative result does not preclude SARS-Cov-2 infection and should not be used as the sole basis for treatment or other patient management  decisions. A negative result may occur with  improper specimen collection/handling, submission of specimen other than nasopharyngeal swab, presence of viral mutation(s) within the areas targeted by this assay, and inadequate number of viral copies(<138 copies/mL). A negative result must be combined with clinical observations, patient history, and epidemiological information. The expected result is Negative.  Fact Sheet for Patients:  BloggerCourse.com  Fact Sheet for Healthcare Providers:  SeriousBroker.it  This test is no t yet approved or cleared by the [place] FDA and  has been authorized for detection and/or diagnosis of SARS-CoV-2 by FDA under an Emergency Use Authorization (EUA). This EUA will remain  in effect (meaning this test can be used) for the duration of the COVID-19 declaration under Section 564(b)(1) of the Act, 21 U.S.C.section 360bbb-3(b)(1), unless the authorization is terminated  or revoked sooner.       Influenza A by PCR NEGATIVE NEGATIVE Final   Influenza B by PCR NEGATIVE NEGATIVE Final    Comment: (NOTE) The Xpert Xpress SARS-CoV-2/FLU/RSV plus assay is intended as an aid in the diagnosis of influenza from Nasopharyngeal swab specimens and should not be used as a sole basis for treatment. Nasal washings and aspirates are unacceptable for Xpert Xpress SARS-CoV-2/FLU/RSV testing.  Fact Sheet for Patients: BloggerCourse.com  Fact Sheet for Healthcare Providers: SeriousBroker.it  This test is not yet approved or cleared by the [place] FDA and has been authorized for detection and/or diagnosis of SARS-CoV-2 by FDA under an Emergency Use Authorization (EUA). This EUA will remain in effect (meaning this test can be used) for the duration of the COVID-19 declaration under Section 564(b)(1) of the Act, 21 U.S.C. section 360bbb-3(b)(1), unless the  authorization is terminated or revoked.  Performed at [facility], [address], [place], [place] [address]       Studies: No results found.  Scheduled Meds:  acetaminophen  1,000 mg Oral Q6H   acetaZOLAMIDE ER  1,000 mg Oral Q12H   docusate sodium  100 mg Oral Daily   prenatal multivitamin  1 tablet Oral Q1200    Continuous Infusions:  lactated ringers 75 mL/hr at 07/13/21 1420   potassium chloride     promethazine (PHENERGAN) injection (IM or IVPB) 25 mg (07/13/21 0844)     LOS: 2 days     Darlin Drop, MD [facility] Pager [phone]  If 7PM-7AM,  please contact night-coverage [email or web address] Password [facility] 07/13/2021, 3:30 PM

## 2021-07-14 LAB — BASIC METABOLIC PANEL
Anion gap: 7 (ref 5–15)
BUN: 5 mg/dL — ABNORMAL LOW (ref 6–20)
CO2: 18 mmol/L — ABNORMAL LOW (ref 22–32)
Calcium: 8.8 mg/dL — ABNORMAL LOW (ref 8.9–10.3)
Chloride: 113 mmol/L — ABNORMAL HIGH (ref 98–111)
Creatinine, Ser: 0.65 mg/dL (ref 0.44–1.00)
GFR, Estimated: >60 mL/min (ref >60)
Glucose, Bld: 92 mg/dL (ref 70–99)
Potassium: 3.6 mmol/L (ref 3.5–5.1)
Sodium: 138 mmol/L (ref 135–145)

## 2021-07-14 LAB — CBC
HCT: 31.4 % — ABNORMAL LOW (ref 36.0–46.0)
Hemoglobin: 10.4 g/dL — ABNORMAL LOW (ref 12.0–15.0)
MCH: 28.4 pg (ref 26.0–34.0)
MCHC: 33.1 g/dL (ref 30.0–36.0)
MCV: 85.8 fL (ref 80.0–100.0)
Platelets: 266 10*3/uL (ref 150–400)
RBC: 3.66 MIL/uL — ABNORMAL LOW (ref 3.87–5.11)
RDW: 13.9 % (ref 11.5–15.5)
WBC: 9.7 10*3/uL (ref 4.0–10.5)
nRBC: 0 % (ref 0.0–0.2)

## 2021-07-14 MED ORDER — LACTATED RINGERS IV SOLN: INTRAVENOUS | Status: AC

## 2021-07-14 NOTE — Progress Notes (Signed)
Neurology Progress Note  S: Still having HAs similar to yesterday. Wants to have r/p LP today.  Had MHAs years ago, about 8 per month, but resolved as she got older. No improvement with Diamox.   O: Current vital signs: BP 103/61 (BP Location: Left Arm)   Pulse 69   Temp 97.9 F (36.6 C) (Oral)   Resp 16   Ht 5\' 2"  (1.575 m)   Wt 67.1 kg   LMP 12/11/2020 (Exact Date)   SpO2 100%   BMI 27.06 kg/m  Vital signs in last 24 hours: Temp:  [97.4 F (36.3 C)-98.3 F (36.8 C)] 97.9 F (36.6 C) (09/13 0817) Pulse Rate:  [65-77] 69 (09/13 0817) Resp:  [16-17] 16 (09/13 0817) BP: (89-106)/(44-61) 103/61 (09/13 0817) SpO2:  [98 %-100 %] 100 % (09/13 0817)  GENERAL: Well appearing female holding her forehead. Awake, alert in NAD. HEENT: Normocephalic and atraumatic. LUNGS: Normal respiratory effort.   NEURO:  Mental Status: Alert and oriented x 4.  Speech/Language: speech is without aphasia or dysarthria.  Naming, repetition, fluency, and comprehension intact.  Cranial Nerves:  Tracks examiners. EOMI. Smile is symmetrical. Hearing intact to voice. Moves all extremities purposefully.  LLE:  patella Gait- deferred.  Medications  Current Facility-Administered Medications:    acetaminophen (TYLENOL) tablet 1,000 mg, 1,000 mg, Oral, Q6H, 03-[birth year]-1992, Elon Spanner, MD, 1,000 mg at 07/14/21 0553   acetaZOLAMIDE ER (DIAMOX) 12 hr capsule 1,000 mg, 1,000 mg, Oral, Q12H, Toberman, Stevi W, NP, 1,000 mg at 07/13/21 2155   calcium carbonate (TUMS - dosed in mg elemental calcium) chewable tablet 400 mg of elemental calcium, 2 tablet, Oral, Q4H PRN, 2156, MD   docusate sodium (COLACE) capsule 100 mg, 100 mg, Oral, Daily, Ranae Pila, MD, 100 mg at 07/13/21 0913   HYDROmorphone (DILAUDID) injection 1 mg, 1 mg, Intravenous, Q3H PRN, 09/12/21, MD, 1 mg at 07/14/21 0934   lactated ringers infusion, , Intravenous, Continuous, 07/16/21, DO, Last Rate: 75 mL/hr  at 07/14/21 0556, Rate Change at 07/14/21 0556   oxyCODONE (Oxy IR/ROXICODONE) immediate release tablet 5 mg, 5 mg, Oral, Q4H PRN, 07/16/21, MD, 5 mg at 07/14/21 0810   prenatal multivitamin tablet 1 tablet, 1 tablet, Oral, Q1200, 07/16/21, MD, 1 tablet at 07/11/21 1527   promethazine (PHENERGAN) 25 mg in sodium chloride 0.9 % 50 mL IVPB, 25 mg, Intravenous, Q6H PRN, 09/10/21, MD, Last Rate: 200 mL/hr at 07/14/21 0434, 25 mg at 07/14/21 0434   zolpidem (AMBIEN) tablet 5 mg, 5 mg, Oral, QHS PRN, 07/16/21, MD, 5 mg at 07/10/21 2129  Pertinent Labs LP opening pressure 24 on 07/10/21.   Imaging MD has reviewed images in epic and the results pertinent to this consultation are:  MRI Brain  Normal brain MRI.  MRV Normal.   Assessment: 30 yo pregnant female who presented with intractable HA 4 days ago. MRI brain and MRV were negative for acute findings. Various medications have been used for HA, including Mg, Cyclobenzaprine, Phenergan, Fioricet, Tylenol, and Diamox. She has been on higher dose Diamox for about 48 hours which has not changed her HA level. She had a LP with opening pressure of 24, which is equivocable for IIH. Patient would like to try LP today. If her opening pressure is not higher (25-30), we would doubt this is IIH, and favor MHAs. Ophthalmology stated patient would have out patient f/up for IIH testing.   Impression:  -Intractable  HA, MHA vs. IIH.   Recommendations/Plan:  -Will get LP today.  -Continue present meds.  -Avoid Fioricet as it causes rebound HAs.  -Low salt diet.  -Neuro will f/up.   Pt seen by Jimmye Norman, MSN, APN-BC/Nurse Practitioner/Neuro and by MD.   Pager: [phone]

## 2021-07-14 NOTE — Progress Notes (Signed)
PROGRESS NOTE  MALLISSA LORENZEN MGQ:[id number] DOB: 03-01-1991 DOA: 07/09/2021 PCP: Lance Morin, MD  Outpatient Specialists: [facility] - OB/GYN      Requesting physician: Elon Spanner - OB/GYN   Reason for consultation: Intractable headache in the setting of pseudotumor cerebri.  Stroke ruled out. MRI, MRV negative.  Given Fiorcet and opioids without improvement.  Opening pressure 24, removed 8 cc fluid.  Ophthalmology saw her to evaluate for papilledema, she will need to follow-up outpatient for baseline visual field and RNFL testing to get baseline.  She is agreeable.  Currently on 1000 g twice daily of acetazolamide (category C).  May need serial LP.  Neuro and OB/GYN discussing further therapies for intractable headache.               History of Present Illness: Adriana Harrell is an 30 y.o. female G4P2 at 33 weeks with history of migraines.  She reports an uncomplicated pregnancy and has a 5 and 32 yo at home with also uncomplicated pregnancies.  She had migraines in her late teens/early 32s but none since.  She developed a headache that is all over her head about 1 week ago.  Nothing seems to help the headache and the pain worsened the day prior to her admission.  She is having R blurry vision.  She was seen by ophthalmology, MRI and MRV head were negative for stroke.  Neurology has been directing the management.   HPI/Recap of past 24 hours:  She is currently on acetazolamide 1000 mg twice daily, the dose was increased on 07/13/2021 by neurologist recommendation.  She is also on as needed IV Dilaudid, oxycodone, and promethazine 25 mg IV every 6 hours as needed for nausea.  She was fully informed by neurology of the risks/benefits of these medications to her and to the fetus.  She would like to continue.  07/14/2021: Her headache is the same.  8 to 9 out of 10.  Assessment/Plan: Principal Problem:   Pseudotumor cerebri Active Problems:   Supervision of normal IUP (intrauterine pregnancy) in  multigravida, unspecified trimester   Pseudotumor   Pregnancy  Persistent headache secondary to pseudotumor cerebrii Still with headache 8-9/10 Opening pressure 24, normal less than 20, was reduced to 15 after LP. Seen by neurology.  Possible repeat lumbar puncture while in the hospital if increased dose of acetazolamide not helpful.  She also needs outpatient ophthalmology evaluation for dilated retinal exam.  Patient understands and agrees with this plan. Currently on Diamox 1000 mg BID, dose increased on 9/11  Labs unremarkable, MRI, MRV negative She is also on opiates as needed for treatment of her headache pain -The patient understands the risks associated with these medications in pregnancy and wishes to proceed.  Resolved electrolyte disturbances Serum potassium 3.4 repleted>> 3.6 Serum magnesium 1.6, repleted>> 1.7.  Nausea in pregnancy On IV Phenergan 25 mg every 6 hours as needed Encourage oral intake as tolerated.  Anemia of pregnancy Hemoglobin uptrending 10.4 from 8.7 No overt bleeding Continue prenatal vitamins and continue to monitor Obtain CBC in the morning   Normal IUP -Admitted to OB/GYN service -Management of pregnancy per OB   Thank you for the consult.  We will continue to follow with you.   Objective: Vitals:   07/14/21 0353 07/14/21 0817 07/14/21 1155 07/14/21 1553  BP: (!) 106/59 103/61 (!) 92/45 (!) 96/44  Pulse: 67 69 83 65  Resp: 17 16 15 18   Temp: (!) 97.5 F (36.4 C) 97.9 F (36.6 C)  97.6 F (36.4 C) 98 F (36.7 C)  TempSrc: Oral Oral Oral Oral  SpO2: 100% 100% 98% 98%  Weight:      Height:        Intake/Output Summary (Last 24 hours) at 07/14/2021 1758 Last data filed at 07/14/2021 1736 Gross per 24 hour  Intake 5545.09 ml  Output 2425 ml  Net 3120.09 ml   Filed Weights   07/10/21 2100  Weight: 67.1 kg    Exam:  General: 30 y.o. year-old female well-developed well-nourished in no acute distress.  She is alert and oriented  x3.  Uncomfortable due to headaches.  Cardiovascular: Regular rate and rhythm no rubs or gallops. Respiratory: Clear to auscultation with no wheezes or rales. Abdomen: Gravid abdomen bowel sounds present.   Musculoskeletal: No lower extremity edema bilaterally. Skin: No ulcerative lesions noted. Psychiatry: Mood is appropriate for condition and setting.   Data Reviewed: CBC: Recent Labs  Lab 07/09/21 1539 07/12/21 0538 07/14/21 0406  WBC 12.5* 9.3 9.7  HGB 10.2* 8.7* 10.4*  HCT 31.1* 26.6* 31.4*  MCV 85.4 86.1 85.8  PLT 255 215 266   Basic Metabolic Panel: Recent Labs  Lab 07/09/21 1539 07/12/21 0538 07/13/21 0744 07/14/21 0406  NA 136 137  --  138  K 3.4* 3.4* 3.3* 3.6  CL 106 114*  --  113*  CO2 23 18*  --  18*  GLUCOSE 81 85  --  92  BUN <5* 6  --  5*  CREATININE 0.51 0.62  --  0.65  CALCIUM 8.4* 8.3*  --  8.8*  MG  --  1.6* 1.7  --    GFR: Estimated Creatinine Clearance: 90.7 mL/min (by C-G formula based on SCr of 0.65 mg/dL). Liver Function Tests: Recent Labs  Lab 07/09/21 1539 07/12/21 0538  AST 34 19  ALT 30 20  ALKPHOS 113 85  BILITOT 0.3 0.4  PROT 6.0* 5.1*  ALBUMIN 2.5* 2.2*   No results for input(s): LIPASE, AMYLASE in the last 168 hours. No results for input(s): AMMONIA in the last 168 hours. Coagulation Profile: No results for input(s): INR, PROTIME in the last 168 hours. Cardiac Enzymes: No results for input(s): CKTOTAL, CKMB, CKMBINDEX, TROPONINI in the last 168 hours. BNP (last 3 results) No results for input(s): PROBNP in the last 8760 hours. HbA1C: No results for input(s): HGBA1C in the last 72 hours. CBG: No results for input(s): GLUCAP in the last 168 hours. Lipid Profile: No results for input(s): CHOL, HDL, LDLCALC, TRIG, CHOLHDL, LDLDIRECT in the last 72 hours. Thyroid Function Tests: No results for input(s): TSH, T4TOTAL, FREET4, T3FREE, THYROIDAB in the last 72 hours. Anemia Panel: No results for input(s): VITAMINB12,  FOLATE, FERRITIN, TIBC, IRON, RETICCTPCT in the last 72 hours. Urine analysis:    Component Value Date/Time   COLORURINE YELLOW 07/09/2021 1452   APPEARANCEUR CLEAR 07/09/2021 1452   APPEARANCEUR Hazy 09/15/2012 1556   LABSPEC <1.005 (L) 07/09/2021 1452   LABSPEC 1.043 09/15/2012 1556   PHURINE 7.0 07/09/2021 1452   GLUCOSEU NEGATIVE 07/09/2021 1452   GLUCOSEU Negative 09/15/2012 1556   HGBUR NEGATIVE 07/09/2021 1452   BILIRUBINUR NEGATIVE 07/09/2021 1452   BILIRUBINUR Negative 09/15/2012 1556   KETONESUR NEGATIVE 07/09/2021 1452   PROTEINUR NEGATIVE 07/09/2021 1452   NITRITE NEGATIVE 07/09/2021 1452   LEUKOCYTESUR NEGATIVE 07/09/2021 1452   LEUKOCYTESUR Negative 09/15/2012 1556   Sepsis Labs: @LABRCNTIP (procalcitonin:4,lacticidven:4)  ) Recent Results (from the past 240 hour(s))  CSF culture w Gram Stain  Status: None   Collection Time: 07/10/21  6:21 AM   Specimen: CSF; Cerebrospinal Fluid  Result Value Ref Range Status   Specimen Description CSF  Final   Special Requests NONE  Final   Gram Stain CYTOSPIN SMEAR NO WBC SEEN NO ORGANISMS SEEN   Final   Culture   Final    NO GROWTH 3 DAYS Performed at [facility], [address] [place], [place] [address]    Report Status 07/13/2021 FINAL  Final  Resp Panel by RT-PCR (Flu A&B, Covid) Nasopharyngeal Swab     Status: None   Collection Time: 07/10/21  3:45 PM   Specimen: Nasopharyngeal Swab; Nasopharyngeal(NP) swabs in vial transport medium  Result Value Ref Range Status   SARS Coronavirus 2 by RT PCR NEGATIVE NEGATIVE Final    Comment: (NOTE) SARS-CoV-2 target nucleic acids are NOT DETECTED.  The SARS-CoV-2 RNA is generally detectable in upper respiratory specimens during the acute phase of infection. The lowest concentration of SARS-CoV-2 viral copies this assay can detect is 138 copies/mL. A negative result does not preclude SARS-Cov-2 infection and should not be used as the sole basis for treatment  or other patient management decisions. A negative result may occur with  improper specimen collection/handling, submission of specimen other than nasopharyngeal swab, presence of viral mutation(s) within the areas targeted by this assay, and inadequate number of viral copies(<138 copies/mL). A negative result must be combined with clinical observations, patient history, and epidemiological information. The expected result is Negative.  Fact Sheet for Patients:  BloggerCourse.com  Fact Sheet for Healthcare Providers:  SeriousBroker.it  This test is no t yet approved or cleared by the [place] FDA and  has been authorized for detection and/or diagnosis of SARS-CoV-2 by FDA under an Emergency Use Authorization (EUA). This EUA will remain  in effect (meaning this test can be used) for the duration of the COVID-19 declaration under Section 564(b)(1) of the Act, 21 U.S.C.section 360bbb-3(b)(1), unless the authorization is terminated  or revoked sooner.       Influenza A by PCR NEGATIVE NEGATIVE Final   Influenza B by PCR NEGATIVE NEGATIVE Final    Comment: (NOTE) The Xpert Xpress SARS-CoV-2/FLU/RSV plus assay is intended as an aid in the diagnosis of influenza from Nasopharyngeal swab specimens and should not be used as a sole basis for treatment. Nasal washings and aspirates are unacceptable for Xpert Xpress SARS-CoV-2/FLU/RSV testing.  Fact Sheet for Patients: BloggerCourse.com  Fact Sheet for Healthcare Providers: SeriousBroker.it  This test is not yet approved or cleared by the [place] FDA and has been authorized for detection and/or diagnosis of SARS-CoV-2 by FDA under an Emergency Use Authorization (EUA). This EUA will remain in effect (meaning this test can be used) for the duration of the COVID-19 declaration under Section 564(b)(1) of the Act, 21 U.S.C. section  360bbb-3(b)(1), unless the authorization is terminated or revoked.  Performed at [facility], [address] [place], [place] [address]       Studies: No results found.  Scheduled Meds:  acetaminophen  1,000 mg Oral Q6H   acetaZOLAMIDE ER  1,000 mg Oral Q12H   docusate sodium  100 mg Oral Daily   prenatal multivitamin  1 tablet Oral Q1200    Continuous Infusions:  lactated ringers 75 mL/hr at 07/14/21 1731   promethazine (PHENERGAN) injection (IM or IVPB) Stopped (07/14/21 1101)     LOS: 3 days     Darlin Drop, MD [facility]  Pager [phone]  If 7PM-7AM, please contact night-coverage [email or web address] Password [facility] 07/14/2021, 5:58 PM

## 2021-07-14 NOTE — Progress Notes (Signed)
No acute events overnight.  Patient states she notices no improvement in HA; medications work well but she continues to require scheduled narcotics for pain management. Active FM.  No CTX, LOF or VB.    Vitals with BMI 07/14/2021 07/14/2021 07/14/2021  Height - - -  Weight - - -  BMI - - -  Systolic 103 106 778  Diastolic 61 59 48  Pulse 69 67 65   CBC Latest Ref Rng & Units 07/14/2021 07/12/2021 07/09/2021  WBC 4.0 - 10.5 K/uL 9.7 9.3 12.5(H)  Hemoglobin 12.0 - 15.0 g/dL 10.4(L) 8.7(L) 10.2(L)  Hematocrit 36.0 - 46.0 % 31.4(L) 26.6(L) 31.1(L)  Platelets 150 - 400 K/uL 266 215 255   CMP Latest Ref Rng & Units 07/14/2021 07/13/2021 07/12/2021  Glucose 70 - 99 mg/dL 92 - 85  BUN 6 - 20 mg/dL 5(L) - 6  Creatinine 2.42 - 1.00 mg/dL 3.53 - 6.14  Sodium 431 - 145 mmol/L 138 - 137  Potassium 3.5 - 5.1 mmol/L 3.6 3.3(L) 3.4(L)  Chloride 98 - 111 mmol/L 113(H) - 114(H)  CO2 22 - 32 mmol/L 18(L) - 18(L)  Calcium 8.9 - 10.3 mg/dL 5.4(M) - 8.3(L)  Total Protein 6.5 - 8.1 g/dL - - 5.1(L)  Total Bilirubin 0.3 - 1.2 mg/dL - - 0.4  Alkaline Phos 38 - 126 U/L - - 85  AST 15 - 41 U/L - - 19  ALT 0 - 44 U/L - - 20   Gen: A&O x 3 Abd: gravid, soft, NT Ext: no c/c/e  Reactive NST  30yo [id number] at [redacted]w[redacted]d HD 5 with pseudotumor cerebri diagnosed on LP -HA-s/p MRI and MRV which were wnl.  Neurology, IM managing and planning ophtho outpatient.  Per patient, neurology is considering repeat LP.  Appreciate consulting services in the management of patient's care. -Continue acetazolamide, promethazine, magnesium, dilaudid and fioricet -Fetal monitoring reassuring  Mitchel Honour, DO

## 2021-07-14 NOTE — Procedures (Addendum)
LUMBAR PUNCTURE (SPINAL TAP) PROCEDURE NOTE  Indication: ? IIH   Proceduralists: Jimmye Norman, NP and H. Collins, MD.    Risks of the procedure were dicussed with the patient including post-LP headache, bleeding, infection, weakness/numbness of legs(radiculopathy), death.    Consent obtained from: patient    Procedure Note The patient was prepped and draped, and using sterile technique a 20 gauge quinke spinal needle was inserted in the L4-5 space.   Opening pressure was not obtained due to very slow CSF drip. Approximately 11 cc of CSF were drained.  Patient tolerated the procedure well and blood loss was minimal.

## 2021-07-15 DIAGNOSIS — Z3A28 28 weeks gestation of pregnancy: Secondary | ICD-10-CM

## 2021-07-15 MED ORDER — ACETAZOLAMIDE ER 500 MG PO CP12
500.0000 mg | ORAL_CAPSULE | Freq: Once | ORAL | Status: AC
  Administered 2021-07-15: 500 mg via ORAL
  Filled 2021-07-15: qty 1

## 2021-07-15 MED ORDER — ACETAZOLAMIDE ER 500 MG PO CP12
1500.0000 mg | ORAL_CAPSULE | Freq: Two times a day (BID) | ORAL | Status: DC
  Filled 2021-07-15: qty 3

## 2021-07-15 MED ORDER — ACETAZOLAMIDE ER 500 MG PO CP12
1500.0000 mg | ORAL_CAPSULE | Freq: Two times a day (BID) | ORAL | Status: DC
  Administered 2021-07-15 – 2021-07-16 (×2): 1500 mg via ORAL
  Filled 2021-07-15 (×4): qty 3

## 2021-07-15 NOTE — Progress Notes (Addendum)
Neurology Progress Note  Brief HPI: 30 y.o. 16wk 4day pregnant female with PMHx of migraines previously on Topamax without recurrence in over one decade who presented to the ED 9/9 for evaluation of unremitting headaches for one week that are holocephalic, throbbing, with visual disturbance initially described as grainy vision OD, worse with laying down and bending, and with associated nausea and photophobia. MRI and MRV head imaging were negative. Headache without improvement with medications including Fioricet, Reglan, Solu-Medrol, magnesium, cyclobenzaprine, benadryl, and Zofran. LP was performed in the ED with elevated opening pressure of 24 with some headache relief following CSF drainage to closing pressure of 15 and improvement of vision, c/w IIH.   Subjective: No acute overnight events.  Improvement in headache overnight and into this morning.  Patient states she was able to get up and go to the restroom this morning without significant dizziness or increased headache. Her current headache is 5/10 and she states that her blurry vision has been improved since the LP yesterday.   Exam: Vitals:   07/15/21 0347 07/15/21 0803  BP: (!) 104/54 (!) 98/51  Pulse: 79 78  Resp: 16 16  Temp: 98.2 F (36.8 C) 97.9 F (36.6 C)  SpO2: 100% 100%   Gen: Sitting up in bed, eating breakfast, in no acute distress Resp: non-labored breathing, no respiratory distress Abd: soft, rounded, non-tender  Neuro: Mental Status: Awake, alert, and oriented.  Speech is intact without dysarthria or aphasia.  She follows commands and is able to provide a clear and coherent history of present illness.  No neglect noted Cranial Nerves: PERRL, EOMI, visual fields are full with reported resolution of blurry vision since yesterday, face is symmetric resting and with movement, hearing is intact to voice, phonation normal. Motor: Moves all extremities spontaneously and with antigravity movement without asymmetry.  Tone  and bulk are normal. Sensory:Intact to light touch throughout Gait: Deferred  Pertinent Labs: CBC    Component Value Date/Time   WBC 9.7 07/14/2021 0406   RBC 3.66 (L) 07/14/2021 0406   HGB 10.4 (L) 07/14/2021 0406   HGB 15.0 09/15/2012 1556   HCT 31.4 (L) 07/14/2021 0406   HCT 43.4 09/15/2012 1556   PLT 266 07/14/2021 0406   PLT 313 09/15/2012 1556   MCV 85.8 07/14/2021 0406   MCV 85 09/15/2012 1556   MCH 28.4 07/14/2021 0406   MCHC 33.1 07/14/2021 0406   RDW 13.9 07/14/2021 0406   RDW 13.0 09/15/2012 1556   LYMPHSABS 2.4 05/19/2021 2047   MONOABS 0.8 05/19/2021 2047   EOSABS 0.2 05/19/2021 2047   BASOSABS 0.0 05/19/2021 2047   CMP     Component Value Date/Time   NA 138 07/14/2021 0406   NA 138 09/15/2012 1556   K 3.6 07/14/2021 0406   K 4.0 09/15/2012 1556   CL 113 (H) 07/14/2021 0406   CL 107 09/15/2012 1556   CO2 18 (L) 07/14/2021 0406   CO2 22 09/15/2012 1556   GLUCOSE 92 07/14/2021 0406   GLUCOSE 90 09/15/2012 1556   BUN 5 (L) 07/14/2021 0406   BUN 8 09/15/2012 1556   CREATININE 0.65 07/14/2021 0406   CREATININE 0.65 09/15/2012 1556   CALCIUM 8.8 (L) 07/14/2021 0406   CALCIUM 9.1 09/15/2012 1556   PROT 5.1 (L) 07/12/2021 0538   PROT 8.7 (H) 09/15/2012 1556   ALBUMIN 2.2 (L) 07/12/2021 0538   ALBUMIN 4.5 09/15/2012 1556   AST 19 07/12/2021 0538   AST 23 09/15/2012 1556   ALT 20 07/12/2021  0538   ALT 18 09/15/2012 1556   ALKPHOS 85 07/12/2021 0538   ALKPHOS 122 09/15/2012 1556   BILITOT 0.4 07/12/2021 0538   BILITOT 0.5 09/15/2012 1556   GFRNONAA >60 07/14/2021 0406   GFRNONAA >60 09/15/2012 1556   GFRAA >60 09/14/2019 0755   GFRAA >60 09/15/2012 1556   Imaging Reviewed:  MRV head wo contrast 07/09/2021: Negative for venous sinus thrombosis.   MRI brain wo contrast 07/09/2021: Normal brain MRI.  No acute intracranial abnormality identified.  Assessment: 30 year old female with pseudotumor cerebri with constant headaches. 1. Exam continues to be  nonfocal. Has continued holocephalic headache that is improving from 10/10 yesterday morning to 5/10 in severity with improvement in blurry vision which may have been in th morning prior to therapeutic LP which drained 11cc. Improvement in symptoms this morning following therapeutic spinal tap yesterday 9/14 is again suggestive of pseudotumor cerebri however, she has a history of migraine headaches and she had COVID 3x which is known to cause post-COVID headaches. She stated that to date, acetazolamide has not been helpful.  2. Opening pressure from LP in the ED was elevated at 24 on 9/9 (normal is <20).  Drained CSF for a closing pressure of 15. CSF studies are unremarkable.   3. MRI brain: Normal brain MRI.  No acute intracranial abnormality identified.   4. MRV brain: Negative for venous sinus thrombosis.  5. Ophthalmology evaluation 9/9 without florid disc edema, however, within the limitation of indirect ophthalmoscopy with limited magnification and visualization. She has plans to follow up outpatient.   Impression:  -Intractable HA, MHA, IIH vs post-COVID headache.   Recommendations: - Increase acetazolamide to 1,500 mg 2 times daily. If this is not effective will increase to 4g total daily dose. - Monitor for hyperchloremic metabolic acidosis and hypokalemia and repeat ad needed. - Avoid Fioricet as it can cause rebound headaches. - If needed may trial butorphanol tartrate Intranasal (category C) - 1 spray in 1 nostril; if inadequate control in 60 - 120 minutes, an additional 1 spray in 1 nostril may be given. May repeat initial dose sequence in q4-6 hours after the last dose as needed. - Low Na diet - Management of metabolic derangements per primary team, specifically hypokalemia in the setting of acetazolamide. - Neurology will continue to follow.   Marisue Humble, MD Stroke Neurology Page: [phone]

## 2021-07-15 NOTE — Progress Notes (Signed)
HA today is 6-7/10 (8/10) yesterday Was able to walk to BR last night without increasing her HA  Today's Vitals   07/15/21 0430 07/15/21 0643 07/15/21 0759 07/15/21 0803  BP:    (!) 98/51  Pulse:    78  Resp:    16  Temp:    97.9 F (36.6 C)  TempSrc:    Oral  SpO2:    100%  Weight:      Height:      PainSc: Asleep 7  6     Body mass index is 27.06 kg/m.   Sitting up in bed eating breakfast  NST reactive  A/P:30yo [id number] at [redacted]w[redacted]d HD 6 with pseudotumor cerebri diagnosed on LP -HA-s/p MRI and MRV which were wnl.  Neurology, IM managing and planning ophtho outpatient. Some improvement in HA today.  Appreciate consulting services in the management of patient's care. -Continue acetazolamide, promethazine, magnesium, dilaudid and fioricet -Fetal monitoring reassuring

## 2021-07-15 NOTE — Progress Notes (Signed)
[redacted] weeks pregnant with history of migraines admitted with headache for about 1 week.  Hospitalist team consulted to rule out a stroke.  MRI and MRV of the head were negative for stroke.  Neurology consulted.  Lumbar puncture 9/9, lumbar puncture 9/14 with elevated pressures.  Negative lumbar puncture studies.  Patient seen and examined.  She tells me that her headache is about 5/6 out of 10 today as compared to 8/9 out of 10 yesterday.  Pseudotumor cerebri in 28 weeks pregnancy. -Repeated spinal tap, pressure reduced to 15 with lumbar puncture on 9/13 -Currently managed with Dilaudid, increasing dose of Diamox, Tylenol, Phenergan and other symptomatic treatment. -Replace potassium and magnesium.  Recheck tomorrow morning.  Will continue to follow neurology recommendations, no new recommendations from the internal medicine today. Discussed with OB/GYN attending.  He requested to continue to follow patient in the hospital.  Total time spent: 15 minutes

## 2021-07-16 ENCOUNTER — Encounter (HOSPITAL_COMMUNITY): Payer: Self-pay | Admitting: Certified Registered Nurse Anesthetist

## 2021-07-16 LAB — BASIC METABOLIC PANEL
Anion gap: 8 (ref 5–15)
BUN: 6 mg/dL (ref 6–20)
CO2: 15 mmol/L — ABNORMAL LOW (ref 22–32)
Calcium: 8.4 mg/dL — ABNORMAL LOW (ref 8.9–10.3)
Chloride: 114 mmol/L — ABNORMAL HIGH (ref 98–111)
Creatinine, Ser: 0.63 mg/dL (ref 0.44–1.00)
GFR, Estimated: >60 mL/min (ref >60)
Glucose, Bld: 85 mg/dL (ref 70–99)
Potassium: 3 mmol/L — ABNORMAL LOW (ref 3.5–5.1)
Sodium: 137 mmol/L (ref 135–145)

## 2021-07-16 LAB — CBC WITH DIFFERENTIAL/PLATELET
Abs Immature Granulocytes: 0.44 10*3/uL — ABNORMAL HIGH (ref 0.00–0.07)
Basophils Absolute: 0 10*3/uL (ref 0.0–0.1)
Basophils Relative: 0 %
Eosinophils Absolute: 0.2 10*3/uL (ref 0.0–0.5)
Eosinophils Relative: 2 %
HCT: 28.8 % — ABNORMAL LOW (ref 36.0–46.0)
Hemoglobin: 9.6 g/dL — ABNORMAL LOW (ref 12.0–15.0)
Immature Granulocytes: 5 %
Lymphocytes Relative: 22 %
Lymphs Abs: 2.1 10*3/uL (ref 0.7–4.0)
MCH: 27.7 pg (ref 26.0–34.0)
MCHC: 33.3 g/dL (ref 30.0–36.0)
MCV: 83.2 fL (ref 80.0–100.0)
Monocytes Absolute: 0.8 10*3/uL (ref 0.1–1.0)
Monocytes Relative: 8 %
Neutro Abs: 6.2 10*3/uL (ref 1.7–7.7)
Neutrophils Relative %: 63 %
Platelets: 240 10*3/uL (ref 150–400)
RBC: 3.46 MIL/uL — ABNORMAL LOW (ref 3.87–5.11)
RDW: 14.2 % (ref 11.5–15.5)
WBC: 9.7 10*3/uL (ref 4.0–10.5)
nRBC: 0 % (ref 0.0–0.2)

## 2021-07-16 LAB — MAGNESIUM: Magnesium: 1.6 mg/dL — ABNORMAL LOW (ref 1.7–2.4)

## 2021-07-16 LAB — PHOSPHORUS: Phosphorus: 4.1 mg/dL (ref 2.5–4.6)

## 2021-07-16 MED ORDER — POTASSIUM CHLORIDE 10 MEQ/100ML IV SOLN
10.0000 meq | INTRAVENOUS | Status: AC
Start: 2021-07-16 — End: 2021-07-16
  Administered 2021-07-16 (×5): 10 meq via INTRAVENOUS
  Filled 2021-07-16 (×5): qty 100

## 2021-07-16 MED ORDER — ACETAZOLAMIDE ER 500 MG PO CP12
2000.0000 mg | ORAL_CAPSULE | Freq: Two times a day (BID) | ORAL | Status: DC
  Administered 2021-07-16 – 2021-07-17 (×3): 2000 mg via ORAL
  Filled 2021-07-16 (×4): qty 4

## 2021-07-16 MED ORDER — MAGNESIUM SULFATE 2 GM/50ML IV SOLN
2.0000 g | Freq: Once | INTRAVENOUS | Status: AC
  Administered 2021-07-16: 2 g via INTRAVENOUS
  Filled 2021-07-16: qty 50

## 2021-07-16 MED ORDER — POLYETHYLENE GLYCOL 3350 17 G PO PACK
17.0000 g | PACK | Freq: Every day | ORAL | Status: DC
  Administered 2021-07-16 – 2021-07-18 (×3): 17 g via ORAL
  Filled 2021-07-16 (×3): qty 1

## 2021-07-16 MED ORDER — BUTORPHANOL TARTRATE 10 MG/ML NA SOLN
1.0000 | NASAL | Status: DC | PRN
  Administered 2021-07-16 (×2): 1 via NASAL
  Administered 2021-07-17 – 2021-07-18 (×5): 2 via NASAL
  Filled 2021-07-16: qty 2.5

## 2021-07-16 MED ORDER — POTASSIUM CHLORIDE CRYS ER 20 MEQ PO TBCR
40.0000 meq | EXTENDED_RELEASE_TABLET | Freq: Two times a day (BID) | ORAL | Status: AC
  Administered 2021-07-16 – 2021-07-17 (×4): 40 meq via ORAL
  Filled 2021-07-16 (×4): qty 2

## 2021-07-16 MED ORDER — POTASSIUM CHLORIDE CRYS ER 20 MEQ PO TBCR: 20.0000 meq | EXTENDED_RELEASE_TABLET | Freq: Two times a day (BID) | ORAL | Status: DC

## 2021-07-16 NOTE — Progress Notes (Addendum)
Neurology Progress Note  Brief HPI: 30 y.o. 51wk 4day pregnant female with PMHx of migraines previously on Topamax without recurrence in over one decade who presented to the ED 9/9 for evaluation of unremitting headaches for one week that are holocephalic, throbbing, with visual disturbance initially described as grainy vision OD, worse with laying down and bending, and with associated nausea and photophobia. MRI and MRV head imaging were negative. Headache without improvement with medications including Fioricet, Reglan, Solu-Medrol, magnesium, cyclobenzaprine, benadryl, and Zofran. LP was performed in the ED with elevated opening pressure of 24 with some headache relief following CSF drainage to closing pressure of 15 and improvement of vision, c/w IIH.  Subjective: No acute overnight events Patient reports return of headache with a 9/10 severity and return of grainy/blurry vision  Exam: Vitals:   07/16/21 1139 07/16/21 1550  BP: (!) 94/47 107/64  Pulse: 74 78  Resp: 16 16  Temp: 97.9 F (36.6 C) 97.6 F (36.4 C)  SpO2: 98% 100%   Gen: Sitting up in bed, with husband at bedside in no acute distress Resp: non-labored breathing, no respiratory distress Abd: soft, rounded, non-tender   Neuro: Mental Status: Awake, alert, and oriented.  Speech is intact without dysarthria or aphasia.  She follows commands and is able to provide a clear and coherent history of present illness.  No neglect noted Cranial Nerves: PERRL, EOMI, visual fields are full with reported return of blurry/grainy vision since yesterday, face is symmetric resting and with movement, hearing is intact to voice, phonation normal. Motor: Moves all extremities spontaneously and with antigravity movement without asymmetry.  Tone and bulk are normal. Sensory:Intact to light touch throughout Gait: Deferred  Pertinent Labs: CBC    Component Value Date/Time   WBC 9.7 07/16/2021 0449   RBC 3.46 (L) 07/16/2021 0449   HGB  9.6 (L) 07/16/2021 0449   HGB 15.0 09/15/2012 1556   HCT 28.8 (L) 07/16/2021 0449   HCT 43.4 09/15/2012 1556   PLT 240 07/16/2021 0449   PLT 313 09/15/2012 1556   MCV 83.2 07/16/2021 0449   MCV 85 09/15/2012 1556   MCH 27.7 07/16/2021 0449   MCHC 33.3 07/16/2021 0449   RDW 14.2 07/16/2021 0449   RDW 13.0 09/15/2012 1556   LYMPHSABS 2.1 07/16/2021 0449   MONOABS 0.8 07/16/2021 0449   EOSABS 0.2 07/16/2021 0449   BASOSABS 0.0 07/16/2021 0449   CMP     Component Value Date/Time   NA 137 07/16/2021 0449   NA 138 09/15/2012 1556   K 3.0 (L) 07/16/2021 0449   K 4.0 09/15/2012 1556   CL 114 (H) 07/16/2021 0449   CL 107 09/15/2012 1556   CO2 15 (L) 07/16/2021 0449   CO2 22 09/15/2012 1556   GLUCOSE 85 07/16/2021 0449   GLUCOSE 90 09/15/2012 1556   BUN 6 07/16/2021 0449   BUN 8 09/15/2012 1556   CREATININE 0.63 07/16/2021 0449   CREATININE 0.65 09/15/2012 1556   CALCIUM 8.4 (L) 07/16/2021 0449   CALCIUM 9.1 09/15/2012 1556   PROT 5.1 (L) 07/12/2021 0538   PROT 8.7 (H) 09/15/2012 1556   ALBUMIN 2.2 (L) 07/12/2021 0538   ALBUMIN 4.5 09/15/2012 1556   AST 19 07/12/2021 0538   AST 23 09/15/2012 1556   ALT 20 07/12/2021 0538   ALT 18 09/15/2012 1556   ALKPHOS 85 07/12/2021 0538   ALKPHOS 122 09/15/2012 1556   BILITOT 0.4 07/12/2021 0538   BILITOT 0.5 09/15/2012 1556   GFRNONAA >60 07/16/2021  0449   GFRNONAA >60 09/15/2012 1556   GFRAA >60 09/14/2019 0755   GFRAA >60 09/15/2012 1556     Imaging Reviewed: MRV head wo contrast 07/09/2021: Negative for venous sinus thrombosis.   MRI brain wo contrast 07/09/2021: Normal brain MRI.  No acute intracranial abnormality identified.  Assessment: 30 year old female with pseudotumor cerebri with constant headaches. 1. Exam continues to be nonfocal. Has continued holocephalic headache that had improved to a 5/10 in severity following LP with improvement in visual disturbances yesterday however, today her headache and grainy vision  bilaterally have returned with a 9/10 severity. After long discussion with patient and husband at bedside, patient is agreeable for a trial of Stadol nasal spray and the maximum dosage of acetazolamide prior to repeating lumbar puncture. Risks and benefits of all treatments were discussed in depth at bedside. IM on board to help manage hypokalemia.   2. Opening pressure from LP in the ED was elevated at 24 on 9/9 (normal is <20).  Drained CSF for a closing pressure of 15. CSF studies are unremarkable.    3. MRI brain: Normal brain MRI.  No acute intracranial abnormality identified.    4. MRV brain: Negative for venous sinus thrombosis.   5. Ophthalmology evaluation 9/9 without florid disc edema, however, within the limitation of indirect ophthalmoscopy with limited magnification and visualization. She has plans to follow up outpatient.  Impression:  -Intractable HA, MHA, IIH vs post-COVID headache.   Recommendations: - Trial butorphanol tartrate Intranasal (category C) - 1 spray in 1 nostril; if inadequate control in 60 - 120 minutes, an additional 1 spray in 1 nostril may be given. May repeat initial dose sequence in q4-6 hours after the last dose as needed. - Discontinue Oxycodone with the addition to butorphanol nasal spray - Increase acetazolamide to 2,000 mg PO BID - Monitor for hyperchloremic metabolic acidosis and hypokalemia and repeat as needed. - Appreciate IM following for management of electrolyte derangements - Will reevaluate tomorrow and consider repeat therapeutic LP if needed - Continue low Na diet - Neurology will continue to follow  - Plan discussed with OB team   Lanae Boast, AGACNP-BC [facility] [phone]  Patient seen, examined, labs,vitals and notes reviewed. Discussed plan with Lanae Boast, NP and agree with assessment and plan as documented above. I have independently reviewed the chart, obtained history, review of systems and examined the  patient.  Electronically signed by:  Marisue Humble, MD Page: [phone] 07/16/2021, 7:48 PM

## 2021-07-16 NOTE — Progress Notes (Signed)
28+ weeks pregnant with history of migraines admitted with headache for about 1 week.  Hospitalist team consulted to rule out a stroke.  MRI and MRV of the head were negative for stroke.  Neurology consulted.  Lumbar puncture 9/9, lumbar puncture 9/14 with elevated pressures.  Negative lumbar puncture studies.  Subjective: Patient seen and examined.  After initial improvement yesterday, she is back to similar type of headache.  Complains of whole head hurting, mainly difficulties on mobility.  No bowel movement since admission.  Was not sure she can take oral potassium.  Used multiple doses of Dilaudid and oxycodone overnight.   Plan of care: Pseudotumor cerebri in 28 weeks pregnancy. -Repeated spinal tap, pressure reduced to 15 with lumbar puncture on 9/13 -Currently managed with Dilaudid, increasing dose of Diamox, Tylenol, Phenergan and other symptomatic treatment. -Replace potassium and magnesium.   -Aggressively replace potassium, will write oral and IV potassium.  Also IV magnesium today.  Intermittent monitoring with serum levels. -Further medication adjustment for headache, will defer to neurology.  Will continue to follow neurology recommendations, electrolyte replacements written.  Laxatives prescribed. Discussed and updated nurse at the bedside.  Total time spent: 15 minutes

## 2021-07-16 NOTE — Progress Notes (Addendum)
Patient and her husband are frustrated with the HA returning.  Want to discuss other options.  It was her son [name]'s birthday yesterday and she had to miss it.  Was able to eat and walk to bathroom alone.  Her HA still remains 8/10 after improvement yesterday.  I answered as many questions as I was able.  Neuro will see her later today and answer her questions regarding management of Pseudotumor cerebri.  Will start SCDs given mostly only able to lay in bed. Defer lovenox for now. Low threshold to start it for DVT prophylaxis.    Rosie Fate MD

## 2021-07-16 NOTE — Progress Notes (Signed)
S: HA back to 8/10. Felt 5/10 and better yesterday. However, woke up with 8/10 HA. Feels frustrated. Opthlo appt tomorrow and will have to reschedule. Still has minor blurry vision.   Today's Vitals   07/16/21 0645 07/16/21 0757 07/16/21 0807 07/16/21 0839  BP:   (!) 86/41   Pulse:   74   Resp:   16   Temp:   98.2 F (36.8 C)   TempSrc:   Oral   SpO2:   98%   Weight:      Height:      PainSc: Asleep 8   8    Body mass index is 27.06 kg/m.   NAD, A&O NWOB Abd soft, nondistended, gravid   NST reactive  A/P:30yo [id number] at 29.2wga HD#7 with pseudotumor cerebri diagnosed on LP - HA-s/p MRI and MRV which were wnl.  Neurology, IM managing and planning ophtho outpatient. Some improvement in HA yesterday after second LP but no back to 8/10 severity. - Appreciate consulting services in the management of patient's care. - Continue electrolyte repletion by IM - Continue acetazolamide, promethazine, magnesium, dilaudid and fioricet per Neuro for HA - Fetal monitoring reassuring - Patient w/out pregnancy complaints.   Rosie Fate MD

## 2021-07-17 LAB — MAGNESIUM: Magnesium: 1.6 mg/dL — ABNORMAL LOW (ref 1.7–2.4)

## 2021-07-17 LAB — BASIC METABOLIC PANEL
Anion gap: 6 (ref 5–15)
BUN: 8 mg/dL (ref 6–20)
CO2: 16 mmol/L — ABNORMAL LOW (ref 22–32)
Calcium: 8.7 mg/dL — ABNORMAL LOW (ref 8.9–10.3)
Chloride: 116 mmol/L — ABNORMAL HIGH (ref 98–111)
Creatinine, Ser: 0.75 mg/dL (ref 0.44–1.00)
GFR, Estimated: >60 mL/min (ref >60)
Glucose, Bld: 96 mg/dL (ref 70–99)
Potassium: 3.6 mmol/L (ref 3.5–5.1)
Sodium: 138 mmol/L (ref 135–145)

## 2021-07-17 MED ORDER — BUTORPHANOL TARTRATE 1 MG/ML IJ SOLN
INTRAMUSCULAR | Status: AC
  Filled 2021-07-17: qty 1

## 2021-07-17 MED ORDER — MAGNESIUM SULFATE 2 GM/50ML IV SOLN
2.0000 g | Freq: Once | INTRAVENOUS | Status: AC
  Administered 2021-07-17: 2 g via INTRAVENOUS
  Filled 2021-07-17: qty 50

## 2021-07-17 MED ORDER — ENSURE ENLIVE PO LIQD
237.0000 mL | Freq: Two times a day (BID) | ORAL | Status: DC
  Administered 2021-07-17 – 2021-07-18 (×2): 237 mL via ORAL
  Filled 2021-07-17 (×4): qty 237

## 2021-07-17 NOTE — Progress Notes (Signed)
S:  Patient is just waking up this morning. She reports headache is still present.  Stadol drops last evening helped somewhat but did not last long.  Reports good fetal movement. Denies contractions  O:  BP (!) 99/54 (BP Location: Right Arm)   Pulse 75   Temp 98.3 F (36.8 C) (Oral)   Resp 17   Ht 5\' 2"  (1.575 m)   Wt 67.1 kg   LMP 12/11/2020 (Exact Date)   SpO2 98%   BMI 27.06 kg/m  Results for orders placed or performed during the hospital encounter of 07/09/21 (from the past 24 hour(s))  Basic metabolic panel     Status: Abnormal   Collection Time: 07/17/21  5:32 AM  Result Value Ref Range   Sodium 138 135 - 145 mmol/L   Potassium 3.6 3.5 - 5.1 mmol/L   Chloride 116 (H) 98 - 111 mmol/L   CO2 16 (L) 22 - 32 mmol/L   Glucose, Bld 96 70 - 99 mg/dL   BUN 8 6 - 20 mg/dL   Creatinine, Ser 07/19/21 0.44 - 1.00 mg/dL   Calcium 8.7 (L) 8.9 - 10.3 mg/dL   GFR, Estimated 7.85 >88 mL/min   Anion gap 6 5 - 15  Magnesium     Status: Abnormal   Collection Time: 07/17/21  5:32 AM  Result Value Ref Range   Magnesium 1.6 (L) 1.7 - 2.4 mg/dL   Scheduled Meds:  acetaminophen  1,000 mg Oral Q6H   acetaZOLAMIDE ER  2,000 mg Oral BID   docusate sodium  100 mg Oral Daily   polyethylene glycol  17 g Oral Daily   potassium chloride  40 mEq Oral BID   prenatal multivitamin  1 tablet Oral Q1200   Continuous Infusions:  magnesium sulfate bolus IVPB     promethazine (PHENERGAN) injection (IM or IVPB) 25 mg (07/17/21 0501)   PRN Meds:.butorphanol, calcium carbonate, HYDROmorphone (DILAUDID) injection, promethazine (PHENERGAN) injection (IM or IVPB), zolpidem  Results for orders placed or performed during the hospital encounter of 07/09/21 (from the past 24 hour(s))  Basic metabolic panel     Status: Abnormal   Collection Time: 07/17/21  5:32 AM  Result Value Ref Range   Sodium 138 135 - 145 mmol/L   Potassium 3.6 3.5 - 5.1 mmol/L   Chloride 116 (H) 98 - 111 mmol/L   CO2 16 (L) 22 - 32 mmol/L    Glucose, Bld 96 70 - 99 mg/dL   BUN 8 6 - 20 mg/dL   Creatinine, Ser 07/19/21 0.44 - 1.00 mg/dL   Calcium 8.7 (L) 8.9 - 10.3 mg/dL   GFR, Estimated 2.77 >41 mL/min   Anion gap 6 5 - 15  Magnesium     Status: Abnormal   Collection Time: 07/17/21  5:32 AM  Result Value Ref Range   Magnesium 1.6 (L) 1.7 - 2.4 mg/dL     IMPRESSION: IUP at 29 w 3 days HD # 8 with headache  Pseudotumor cerebri  PLAN: Stable from obstetric standpoint Neurology and Internal medicine are continuing with primary management  Appreciate their involvement  Electrolyte management per Internal Medicine Consideration per neurology for another Lumbar puncture versus consideration of a VP shunt

## 2021-07-17 NOTE — Progress Notes (Signed)
Initial Nutrition Assessment  DOCUMENTATION CODES:   Not applicable  INTERVENTION:   Vanilla Ensure Enlive/Plus po BID, each supplement provides 350 kcal and 13-20 grams of protein. Allow snacks between meals. Allow double protein portions with meals.  NUTRITION DIAGNOSIS:   Increased nutrient needs related to other (see comment) (pregnancy and fetal growth requirements) as evidenced by estimated needs.  GOAL:   Patient will meet greater than or equal to 90% of their needs  MONITOR:   PO intake, Supplement acceptance  REASON FOR ASSESSMENT:   Antenatal    ASSESSMENT:   30 yo female (29 3/[redacted] weeks pregnant) admitted with pseudotumor cerebri.   Patient reports that she is feeling a little better today. She has not been eating well d/t on and off nausea and vomiting associated with severe headache. She has been eating small snacks between meals as able. Encouraged PO supplements and small, frequent meals. Patient states that she has had Ensure and thinks she could drink it a couple times per day. She prefers vanilla.   Labs reviewed. Mag 1.6  Medications reviewed and include Colace, Miralax, Klor-Con, prenatal MVI.  Pre-pregnancy weight 120 lbs, currently 148 lbs. 28 lb weight gain.   Diet Order:   Diet Order             Diet 2 gram sodium Room service appropriate? Yes; Fluid consistency: Thin  Diet effective now                   EDUCATION NEEDS:   Education needs have been addressed  Skin:  Skin Assessment: Reviewed RN Assessment  Last BM:  Unknown  Height:   Ht Readings from Last 1 Encounters:  07/10/21 5\' 2"  (1.575 m)    Weight:   Wt Readings from Last 1 Encounters:  07/10/21 67.1 kg    BMI:  Body mass index is 27.06 kg/m.  Estimated Nutritional Needs:   Kcal:  2000-2200  Protein:  70-80 gm  Fluid:  >/= 2 L    09/09/21, RD, LDN, CNSC Please refer to Amion for contact information.

## 2021-07-17 NOTE — Plan of Care (Addendum)
Patient states the Stadol and increased dose of Diamox have helped. States her HA has been 5/10 and her vision is rarely blurred. We discussed different plans and she does not want to undergo another LP today. She would like to give change in medications another day. We discussed that she can likely go home tomorrow if OK with OB and Hospitalist. She will need neurology f/up and ophthm f/up on discharge.   Discussed Topamax, but it is a Pregnancy Category D and believe the risks outweigh the potential benefits. Patient agrees with plan.   Plan also discussed with Dr. Thomasena Edis, neuro, who agrees.   Jimmye Norman, MSN, APN-BC Neurology Nurse Practitioner Pager [phone]

## 2021-07-17 NOTE — Progress Notes (Signed)
Char reviewed . Patient expressed to neurology that her symptoms are improved and vision improved. Following up for electrolytes management on high dose diuretics.   Hypokalemia: Adequate today.  Continue high dose of potassium and recheck levels every day.  Tolerating oral intake.  Hypomagnesemia: Persistently low.  2 g magnesium IV today.  Hyperchloremic metabolic acidosis: Chloride 116 Bicarbonate 16 Potassium 3.6. No data for routine use of sodium bicarbonate and is advised to avoid with risk of fluid retention.  We will recheck tomorrow.  If continues to drop bicarbonate further, need to modify or decrease acetazolamide therapy.  Recheck ordered for BMP, magnesium and phosphorus tomorrow morning.  Pseudotumor cerebri: Neurology directed therapy. With some improvement today.   Pregnancy: Stable as per OB/GYN.  No charge . No face to face encounter today. Case discussed with neurology team.

## 2021-07-18 LAB — BASIC METABOLIC PANEL
Anion gap: 9 (ref 5–15)
BUN: 10 mg/dL (ref 6–20)
CO2: 15 mmol/L — ABNORMAL LOW (ref 22–32)
Calcium: 8.9 mg/dL (ref 8.9–10.3)
Chloride: 115 mmol/L — ABNORMAL HIGH (ref 98–111)
Creatinine, Ser: 0.99 mg/dL (ref 0.44–1.00)
GFR, Estimated: >60 mL/min (ref >60)
Glucose, Bld: 93 mg/dL (ref 70–99)
Potassium: 3.6 mmol/L (ref 3.5–5.1)
Sodium: 139 mmol/L (ref 135–145)

## 2021-07-18 LAB — MAGNESIUM: Magnesium: 1.6 mg/dL — ABNORMAL LOW (ref 1.7–2.4)

## 2021-07-18 LAB — PHOSPHORUS: Phosphorus: 4.2 mg/dL (ref 2.5–4.6)

## 2021-07-18 MED ORDER — LACTATED RINGERS IV SOLN: INTRAVENOUS | Status: DC

## 2021-07-18 MED ORDER — PROMETHAZINE HCL 12.5 MG PO TABS: 12.5000 mg | ORAL_TABLET | Freq: Four times a day (QID) | ORAL | 0 refills | Status: DC | PRN

## 2021-07-18 MED ORDER — ACETAZOLAMIDE 125 MG PO TABS: 1400.0000 mg | ORAL_TABLET | Freq: Two times a day (BID) | ORAL | 0 refills | Status: DC

## 2021-07-18 MED ORDER — ACETAZOLAMIDE ER 500 MG PO CP12
1500.0000 mg | ORAL_CAPSULE | Freq: Two times a day (BID) | ORAL | Status: DC
  Administered 2021-07-18: 1500 mg via ORAL
  Filled 2021-07-18 (×2): qty 3

## 2021-07-18 MED ORDER — HYDROMORPHONE HCL 4 MG PO TABS: 4.0000 mg | ORAL_TABLET | ORAL | 0 refills | Status: DC | PRN

## 2021-07-18 MED ORDER — ZOLPIDEM TARTRATE 5 MG PO TABS: 5.0000 mg | ORAL_TABLET | Freq: Every evening | ORAL | 0 refills | Status: DC | PRN

## 2021-07-18 MED ORDER — ACETAMINOPHEN 500 MG PO TABS: 1000.0000 mg | ORAL_TABLET | Freq: Four times a day (QID) | ORAL | 0 refills | Status: DC

## 2021-07-18 MED ORDER — ACETAZOLAMIDE 250 MG PO TABS: 1400.0000 mg | ORAL_TABLET | Freq: Two times a day (BID) | ORAL | Status: DC

## 2021-07-18 MED ORDER — MAGNESIUM SULFATE 2 GM/50ML IV SOLN
2.0000 g | Freq: Once | INTRAVENOUS | Status: AC
  Administered 2021-07-18: 2 g via INTRAVENOUS
  Filled 2021-07-18: qty 50

## 2021-07-18 MED ORDER — BUTORPHANOL TARTRATE 10 MG/ML NA SOLN: 1.0000 | NASAL | 0 refills | Status: DC | PRN

## 2021-07-18 MED ORDER — ACETAZOLAMIDE ER 500 MG PO CP12: 1500.0000 mg | ORAL_CAPSULE | Freq: Two times a day (BID) | ORAL | 0 refills | Status: DC

## 2021-07-18 MED ORDER — HYDROMORPHONE HCL 1 MG/ML IJ SOLN: 1.0000 mg | INTRAMUSCULAR | 0 refills | Status: DC | PRN

## 2021-07-18 MED ORDER — MAGNESIUM OXIDE -MG SUPPLEMENT 400 (240 MG) MG PO TABS: 800.0000 mg | ORAL_TABLET | Freq: Every day | ORAL | 0 refills | Status: DC

## 2021-07-18 MED ORDER — MAGNESIUM OXIDE -MG SUPPLEMENT 400 (240 MG) MG PO TABS
800.0000 mg | ORAL_TABLET | Freq: Every day | ORAL | Status: DC
  Administered 2021-07-18: 800 mg via ORAL
  Filled 2021-07-18: qty 2

## 2021-07-18 NOTE — Discharge Summary (Signed)
Admission Diagnosis:  IUP at 28 weeks Intractable Headache  Discharge Diagnosis: Pseudotumor cerebri  Hospital Course: 30 year old G 4 P 2012 at 28 weeks presents with intractable headache for one week that was refractory to multiple medications. On admission - patient underwent MRI and LP and elevated opening pressure was noted. Diagnosis consistent with pseudotumor cerebri.  She was placed on Diamox and neurology, ophtho and internal medicine consulted during her stay.  She received a second LP during her stay with only short term improvement By July 18, 2021 her headache was a little improved with Diamox and stadol drops.   She was discharge home on September 17 after discussion with Neurology  She will continue Diamox at home as well as stadol drops and dilaudid prn.  She was given Magnesium oxide as well She will follow up with Neurology in 2 weeks, Ophtho appt on Monday and OB appointment next week She will get her electrolytes checked in our office She may need VP shunt in the future - already discussed with Neurology

## 2021-07-18 NOTE — Progress Notes (Signed)
Char reviewed .  Patient seen and examined.  Following up for electrolytes management on high dose diuretics.   Subjective: Patient tells me that by daytime her symptoms improves.  In the morning she has more difficulties with headache but manageable. Still using IV Dilaudid.  Not on any oral narcotics. She was not sure whether current regimen is working but she will try.   Assessment plan: -Hypokalemia: Adequate today.  Continue maintenance potassium on discharge.  Will discharge on potassium chloride 20 mEq/day.  - hypomagnesemia: Persistently low.  2 g magnesium IV today repeat today. If she goes home, she can go home on magnesium oxide 800 mg daily.  -Hyperchloremic metabolic acidosis: Chloride 116 Bicarbonate 15 No data for routine use of sodium bicarbonate and is advised to avoid with risk of fluid retention.  Will need frequent monitoring and possibly decreasing dose of acetazolamide in the future.  - pseudotumor cerebri: Neurology directed therapy. With some improvement today.   Pregnancy: Stable as per OB/GYN.  Total time spent: 20 minutes. If patient decides to go home, she can go home on magnesium and potassium supplements with close follow-up.

## 2021-07-18 NOTE — Progress Notes (Signed)
Reviewed discharge antenatal instructions with patient regarding medications, when to call MD/go to [facility], signs and symptoms of pre-term labor, signs and symptoms of pre-e, and to follow up with neuro, ophthalmology, OB as scheduled.Patient verbalized understanding of discharge antenatal instructions and asked appropriate questions.

## 2021-07-18 NOTE — Plan of Care (Addendum)
Neurology plan of care for discharge. No charge note.   Patient's states HA is better this am, but still assigns it a 5/10. Her blurred vision is rare now. NP discussed plan with OB and secure chatted hospitalist who already put their recommendations in.  Patient is able to be discharged and other teams agree.   Recommendations: -out patient f/up with neurology in 1 week.  referral made to [facility].  -Change Diamox ER to 1500mg  po bid at discharge (discussed with hospitalist)  -Continue Stadol nasal gtts at discharge.  -OB is OK with sending patient home with Dilaudid as well.  -Given Magnesium and KCL recommendations by hospitalist, patient should have her labs redrawn after discharge. (OB or PCP).  -Patient has an ophth appointment already on Monday, so encouraged her to keep that.   Tuesday, MSN, APN-BC Neurology Nurse Practitioner Pager [phone]  Plan discussed with and agreed upon by Dr. [phone].

## 2021-07-18 NOTE — Plan of Care (Signed)
  Problem: Clinical Measurements: Goal: Will remain free from infection Outcome: Adequate for Discharge Goal: Diagnostic test results will improve Outcome: Adequate for Discharge   Problem: Activity: Goal: Risk for activity intolerance will decrease Outcome: Adequate for Discharge   Problem: Nutrition: Goal: Adequate nutrition will be maintained Outcome: Adequate for Discharge   Problem: Coping: Goal: Level of anxiety will decrease Outcome: Adequate for Discharge   Problem: Elimination: Goal: Will not experience complications related to bowel motility Outcome: Adequate for Discharge Goal: Will not experience complications related to urinary retention Outcome: Adequate for Discharge   Problem: Pain Managment: Goal: General experience of comfort will improve Outcome: Adequate for Discharge   Problem: Safety: Goal: Ability to remain free from injury will improve Outcome: Adequate for Discharge   Problem: Skin Integrity: Goal: Risk for impaired skin integrity will decrease Outcome: Adequate for Discharge   Problem: Education: Goal: Knowledge of disease or condition will improve Outcome: Adequate for Discharge Goal: Knowledge of the prescribed therapeutic regimen will improve Outcome: Adequate for Discharge

## 2021-07-21 ENCOUNTER — Ambulatory Visit: Payer: BC Managed Care – PPO | Admitting: Neurology

## 2021-07-21 ENCOUNTER — Encounter: Payer: Self-pay | Admitting: Neurology

## 2021-07-21 VITALS — BP 106/67 | HR 86 | Ht 62.0 in | Wt 145.0 lb

## 2021-07-21 DIAGNOSIS — I639 Cerebral infarction, unspecified: Secondary | ICD-10-CM

## 2021-07-21 DIAGNOSIS — G43611 Persistent migraine aura with cerebral infarction, intractable, with status migrainosus: Secondary | ICD-10-CM

## 2021-07-21 DIAGNOSIS — G932 Benign intracranial hypertension: Secondary | ICD-10-CM | POA: Diagnosis not present

## 2021-07-21 NOTE — Progress Notes (Signed)
Chief Complaint  Patient presents with   New Patient (Initial Visit)    New rm, alone, internal hospital referral idiopathic intracranial hypertension, c/o of head pressure since she left hospital       ASSESSMENT AND PLAN  Adriana Harrell is a 30 y.o. female   Migrainous status Possible intracranial hypertension    Ophthalmology evaluation pending on July 24, 2021  She responded very well to nerve block today, see separate note  I have advised her to hold off Diamox 500 mg 3 tablets twice a day for now if ophthalmology evaluation does show papillary edema, will resume Diamox 1500 twice a day, if not, she may not need Diamox  DIAGNOSTIC DATA (LABS, IMAGING, TESTING) - I reviewed patient records, labs, notes, testing and imaging myself where available. Normal MRI of the brain on July 09, 2021 MRV showed no venous thrombosis  Laboratory evaluation showed BMP, creatinine of 0.99, CBC with hemoglobin of 9.6,  CSF on Sept 9: TP 21, glucose 57, wbc 1, Rbc 0,   MEDICAL HISTORY:  Adriana Harrell, is a 30 year old female, seen in request by Dr. Dione Booze, for evaluation of possible intracranial hypertension, her primary care physician is Dr. Freda Jackson, Wilmer Floor, MD, initial evaluation was on July 21, 2021  I reviewed and summarized the referring note.  She is currently [redacted] weeks pregnant, has children at age 49, 66, no problem with previous pregnancy,  She reported frequent migraine headache when she was younger, but gradually tapered off, previous migraine often preceded by visual aura, lateralized severe pounding headache with light noise sensitivity, nauseous  In early September 2022, she has developed severe holoacranial headaches, pounding, was evaluated [facility] September 2, some relief with Reglan and Fioricet, but headache returned, severe, holoacranial, with light, noise sensitivity, also during intense headache complains of blurry vision,  She presented  to emergency room again on July 09, 2021, concerned about possibility of intracranial hypertension, abnormal MRI of the brain, MRV of the brain, lumbar puncture in lateral recumbent position, reported to be 24 cmH2O, close pressure was 18 cmH2O  She received Solu-Medrol, Flexeril, magnesium, Reglan, with limited improvement of her headache, does report some improvement after lumbar puncture,  Concerning for possible idiopathic intracranial hypertension, she was put on Diamox, currently taking 500 mg 3 tablets twice a day,  She now complains of 5 out of 10 constant holoacranial pressure headaches, occasionally escalate to 8 out of 10, with light noise sensitive, worsening by movement, has been taking frequent Reglan, and Tylenol with limited help    PHYSICAL EXAM:   Vitals:   07/21/21 1353  BP: 106/67  Pulse: 86  Weight: 145 lb (65.8 kg)  Height: 5\' 2"  (1.575 m)   Not recorded     Body mass index is 26.52 kg/m.  PHYSICAL EXAMNIATION:  Gen: NAD, conversant, well nourised, well groomed                     Cardiovascular: Regular rate rhythm, no peripheral edema, warm, nontender. Eyes: Conjunctivae clear without exudates or hemorrhage Neck: Supple, no carotid bruits. Pulmonary: Clear to auscultation bilaterally   NEUROLOGICAL EXAM:  MENTAL STATUS: Speech:    Speech is normal; fluent and spontaneous with normal comprehension.  Cognition:     Orientation to time, place and person     Normal recent and remote memory     Normal Attention span and concentration     Normal Language, naming, repeating,spontaneous speech  Fund of knowledge   CRANIAL NERVES: CN II: Visual fields are full to confrontation. Pupils are round equal and briskly reactive to light.  I had a good review of optic disc, no evidence of papillary edema CN III, IV, VI: extraocular movement are normal. No ptosis. CN V: Facial sensation is intact to light touch CN VII: Face is symmetric with normal eye  closure  CN VIII: Hearing is normal to causal conversation. CN IX, X: Phonation is normal. CN XI: Head turning and shoulder shrug are intact  MOTOR: There is no pronator drift of out-stretched arms. Muscle bulk and tone are normal. Muscle strength is normal.  REFLEXES: Reflexes are 2+ and symmetric at the biceps, triceps, knees, and ankles. Plantar responses are flexor.  SENSORY: Intact to light touch, pinprick and vibratory sensation are intact in fingers and toes.  COORDINATION: There is no trunk or limb dysmetria noted.  GAIT/STANCE: Posture is normal. Gait is steady with normal steps, base, arm swing, and turning. Heel and toe walking are normal. Tandem gait is normal.  Romberg is absent.  REVIEW OF SYSTEMS:  Full 14 system review of systems performed and notable only for as above All other review of systems were negative.   ALLERGIES: Allergies  Allergen Reactions   Penicillins Hives   Tamiflu [Oseltamivir Phosphate] Hives   Tamiflu [Oseltamivir] Hives    HOME MEDICATIONS: Current Outpatient Medications  Medication Sig Dispense Refill   acetaminophen (TYLENOL) 500 MG tablet Take 2 tablets (1,000 mg total) by mouth every 6 (six) hours. 30 tablet 0   acetaZOLAMIDE ER (DIAMOX) 500 MG capsule Take 3 capsules (1,500 mg total) by mouth 2 (two) times daily. 60 capsule 0   butorphanol (STADOL) 10 MG/ML nasal spray Place 1-2 sprays into the nose every 4 (four) hours as needed for headache. 2.5 mL 0   HYDROmorphone (DILAUDID) 4 MG tablet Take 1 tablet (4 mg total) by mouth every 4 (four) hours as needed for severe pain. 30 tablet 0   magnesium oxide (MAG-OX) 400 (240 Mg) MG tablet Take 2 tablets (800 mg total) by mouth daily. 30 tablet 0   Prenatal Vit-Fe Fumarate-FA (PRENATAL MULTIVITAMIN) TABS tablet Take 1 tablet by mouth daily at 12 noon.     promethazine (PHENERGAN) 12.5 MG tablet Take 1 tablet (12.5 mg total) by mouth every 6 (six) hours as needed for nausea or vomiting.  30 tablet 0   zolpidem (AMBIEN) 5 MG tablet Take 1 tablet (5 mg total) by mouth at bedtime as needed for sleep. 30 tablet 0   No current facility-administered medications for this visit.    PAST MEDICAL HISTORY: Past Medical History:  Diagnosis Date   Migraine     PAST SURGICAL HISTORY: Past Surgical History:  Procedure Laterality Date   CESAREAN SECTION     x2   TONSILLECTOMY      FAMILY HISTORY: No family history on file.  SOCIAL HISTORY: Social History   Socioeconomic History   Marital status: Married    Spouse name: Not on file   Number of children: Not on file   Years of education: Not on file   Highest education level: Not on file  Occupational History   Not on file  Tobacco Use   Smoking status: Former   Smokeless tobacco: Never  Substance and Sexual Activity   Alcohol use: Not Currently   Drug use: Not Currently   Sexual activity: Yes  Other Topics Concern   Not on file  Social History  Narrative   Not on file   Social Determinants of Health   Financial Resource Strain: Not on file  Food Insecurity: Not on file  Transportation Needs: Not on file  Physical Activity: Not on file  Stress: Not on file  Social Connections: Not on file  Intimate Partner Violence: Not on file      Levert Feinstein, M.D. Ph.D.  [facility] [address], [address] [place], [place] [address] Ph: [phone] Fax: [phone]  CC:  Dione Booze, MD [address] [place],  [place] [address]  Lance Morin, MD

## 2021-07-21 NOTE — Progress Notes (Signed)
   History: 30 year old female, [redacted] weeks pregnant, presents with persistent headache for 3 weeks    Bilateral occipital and trigeminal nerve block; trigger point injection of bilateral cervical and upper trapezius muscles for intractable headache  Bupivacaine 0.5% was injected on the scalp bilaterally at several locations:  -On the occipital area of the head, 3 injections each side, 0.5 cc per injection at the midpoint between the mastoid process and the occipital protuberance. 2 other injections were done one finger breadth from the initial injection, one at a 10 o'clock position and the other at a 2 o'clock position.  -2 injections of 0.5 cc were done in the temporal regions, 2 fingerbreadths above the tragus of the ear, with the second injection one fingerbreadth posteriorly to the first.  -2 injections were done on the brow, 1 in the medial brow and one over the supraorbital nerve notch, with 0.1 cc for each injection  -1 injection each side of 0.5 cc was done anterior to the tragus of the ear for a trigeminal ganglion injection  -0.5 cc was injected into bilateral upper trapezius and bilateral upper cervical paraspinals, bilateral levator scapular   The patient tolerated the injections well, no complications of the procedure were noted. Injections were made with a 27-gauge needle.

## 2021-07-29 ENCOUNTER — Ambulatory Visit: Payer: BC Managed Care – PPO | Admitting: Neurology

## 2021-07-29 ENCOUNTER — Encounter: Payer: Self-pay | Admitting: Neurology

## 2021-07-29 VITALS — BP 111/71 | HR 94

## 2021-07-29 DIAGNOSIS — R519 Headache, unspecified: Secondary | ICD-10-CM | POA: Diagnosis not present

## 2021-07-29 DIAGNOSIS — G43709 Chronic migraine without aura, not intractable, without status migrainosus: Secondary | ICD-10-CM | POA: Insufficient documentation

## 2021-07-29 DIAGNOSIS — G43711 Chronic migraine without aura, intractable, with status migrainosus: Secondary | ICD-10-CM | POA: Diagnosis not present

## 2021-07-29 MED ORDER — BUTALBITAL-APAP-CAFFEINE 50-325-40 MG PO TABS: 1.0000 | ORAL_TABLET | Freq: Four times a day (QID) | ORAL | 5 refills | Status: DC | PRN

## 2021-07-29 NOTE — Progress Notes (Signed)
   History: Persistent headache with migraine features   Bilateral occipital and trigeminal nerve block; trigger point injection of bilateral cervical and upper trapezius muscles for intractable headache  Bupivacaine 0.5% was injected on the scalp bilaterally at several locations:  -On the occipital area of the head, 3 injections each side, 0.5 cc per injection at the midpoint between the mastoid process and the occipital protuberance. 2 other injections were done one finger breadth from the initial injection, one at a 10 o'clock position and the other at a 2 o'clock position.  -2 injections of 0.5 cc were done in the temporal regions, 2 fingerbreadths above the tragus of the ear, with the second injection one fingerbreadth posteriorly to the first.  -2 injections were done on the brow, 1 in the medial brow and one over the supraorbital nerve notch, with 0.1 cc for each injection  -1 injection each side of 0.5 cc was done anterior to the tragus of the ear for a trigeminal ganglion injection  -0.5 cc was injected into bilateral upper trapezius and bilateral upper cervical paraspinals   The patient tolerated the injections well, no complications of the procedure were noted. Injections were made with a 27-gauge needle.

## 2021-07-29 NOTE — Progress Notes (Signed)
Chief Complaint  Patient presents with   Follow-up    New rm, alone, states her headaches have not gone away, pain scale 5      ASSESSMENT AND PLAN  Adriana Harrell is a 30 y.o. female   Migrainous status   Ophthalmology evaluation with Dr. Sherryll Burger showed no evidence of  disc edema  She responded very well to nerve block previously, now with recurrent moderate headache, repeat nerve block today, see separate note  Fioricet as needed for moderate to severe headaches, also suggested magnesium oxide 400 mg twice daily, riboflavin 100 mg twice daily for headache prevention, she is currently third trimester, due in November 2022  DIAGNOSTIC DATA (LABS, IMAGING, TESTING) - I reviewed patient records, labs, notes, testing and imaging myself where available. Normal MRI of the brain on July 09, 2021 MRV showed no venous thrombosis  Laboratory evaluation showed BMP, creatinine of 0.99, CBC with hemoglobin of 9.6,  CSF on Sept 9: TP 21, glucose 57, wbc 1, Rbc 0,   MEDICAL HISTORY:  Adriana Harrell, is a 30 year old female, seen in request by Dr. Dione Booze, for evaluation of possible intracranial hypertension, her primary care physician is Dr. Freda Jackson, Wilmer Floor, MD, initial evaluation was on July 21, 2021  I reviewed and summarized the referring note.  She is currently [redacted] weeks pregnant, has children at age 60, 83, no problem with previous pregnancy,  She reported frequent migraine headache when she was younger, but gradually tapered off, previous migraine often preceded by visual aura, lateralized severe pounding headache with light noise sensitivity, nauseous  In early September 2022, she has developed severe holoacranial headaches, pounding, was evaluated [facility] September 2, some relief with Reglan and Fioricet, but headache returned, severe, holoacranial, with light, noise sensitivity, also during intense headache complains of blurry vision,  She presented to  emergency room again on July 09, 2021, concerned about possibility of intracranial hypertension, abnormal MRI of the brain, MRV of the brain, lumbar puncture in lateral recumbent position, reported to be 24 cmH2O, close pressure was 18 cmH2O  She received Solu-Medrol, Flexeril, magnesium, Reglan, with limited improvement of her headache, does report some improvement after lumbar puncture,  Concerning for possible idiopathic intracranial hypertension, she was put on Diamox, currently taking 500 mg 3 tablets twice a day,  She now complains of 5 out of 10 constant holoacranial pressure headaches, occasionally escalate to 8 out of 10, with light noise sensitive, worsening by movement, has been taking frequent Reglan, and Tylenol with limited help  UPDATE Sept 28 2022: The nerve block did help her headache for 3 days,  headache has came back, more intense than previous week since Sept 25th, holocranial headache, sometimes, really severe, she tried phenergan, Tylenol Flexeril and sleep, which did help her some.    She tried Stadol nasal pray on Sept 28th, did help some, previously tried Fioricet with limited help  I reviewed ophthalmology evaluation by Dr. Georges Mouse on July 24, 2021, no disc edema was seen, visual field clear OU, no evidence of disc edema on more sensitive measure OCT RNFL, no ophthalmology sign of increased intracranial pressure,    She is no longer taking Diamox  PHYSICAL EXAM:   Vitals:   07/29/21 1302  BP: 111/71  Pulse: 94   Not recorded     There is no height or weight on file to calculate BMI.  PHYSICAL EXAMNIATION:  Gen: NAD, conversant, well nourised, well groomed  Cardiovascular: Regular rate rhythm, no peripheral edema, warm, nontender. Eyes: Conjunctivae clear without exudates or hemorrhage Neck: Supple, no carotid bruits. Pulmonary: Clear to auscultation bilaterally   NEUROLOGICAL EXAM:  MENTAL STATUS: Speech:     Speech is normal; fluent and spontaneous with normal comprehension.  Cognition:     Orientation to time, place and person     Normal recent and remote memory     Normal Attention span and concentration     Normal Language, naming, repeating,spontaneous speech     Fund of knowledge   CRANIAL NERVES: CN II: Visual fields are full to confrontation. Pupils are round equal and briskly reactive to light.  I had a good review of optic disc, no evidence of papillary edema CN III, IV, VI: extraocular movement are normal. No ptosis. CN V: Facial sensation is intact to light touch CN VII: Face is symmetric with normal eye closure  CN VIII: Hearing is normal to causal conversation. CN IX, X: Phonation is normal. CN XI: Head turning and shoulder shrug are intact  MOTOR: There is no pronator drift of out-stretched arms. Muscle bulk and tone are normal. Muscle strength is normal.  REFLEXES: Reflexes are 2+ and symmetric at the biceps, triceps, knees, and ankles. Plantar responses are flexor.  SENSORY: Intact to light touch, pinprick and vibratory sensation are intact in fingers and toes.  COORDINATION: There is no trunk or limb dysmetria noted.  GAIT/STANCE: Posture is normal. Gait is steady with normal steps, base, arm swing, and turning. Heel and toe walking are normal. Tandem gait is normal.  Romberg is absent.  REVIEW OF SYSTEMS:  Full 14 system review of systems performed and notable only for as above All other review of systems were negative.   ALLERGIES: Allergies  Allergen Reactions   Penicillins Hives   Tamiflu [Oseltamivir Phosphate] Hives   Tamiflu [Oseltamivir] Hives    HOME MEDICATIONS: Current Outpatient Medications  Medication Sig Dispense Refill   acetaminophen (TYLENOL) 500 MG tablet Take 2 tablets (1,000 mg total) by mouth every 6 (six) hours. 30 tablet 0   acetaZOLAMIDE ER (DIAMOX) 500 MG capsule Take 3 capsules (1,500 mg total) by mouth 2 (two) times daily. 60  capsule 0   butorphanol (STADOL) 10 MG/ML nasal spray Place 1-2 sprays into the nose every 4 (four) hours as needed for headache. 2.5 mL 0   magnesium oxide (MAG-OX) 400 (240 Mg) MG tablet Take 2 tablets (800 mg total) by mouth daily. 30 tablet 0   Prenatal Vit-Fe Fumarate-FA (PRENATAL MULTIVITAMIN) TABS tablet Take 1 tablet by mouth daily at 12 noon.     promethazine (PHENERGAN) 12.5 MG tablet Take 1 tablet (12.5 mg total) by mouth every 6 (six) hours as needed for nausea or vomiting. 30 tablet 0   zolpidem (AMBIEN) 5 MG tablet Take 1 tablet (5 mg total) by mouth at bedtime as needed for sleep. 30 tablet 0   No current facility-administered medications for this visit.    PAST MEDICAL HISTORY: Past Medical History:  Diagnosis Date   Migraine     PAST SURGICAL HISTORY: Past Surgical History:  Procedure Laterality Date   CESAREAN SECTION     x2   TONSILLECTOMY      FAMILY HISTORY: History reviewed. No pertinent family history.  SOCIAL HISTORY: Social History   Socioeconomic History   Marital status: Married    Spouse name: Not on file   Number of children: Not on file   Years of education: Not on file  Highest education level: Not on file  Occupational History   Not on file  Tobacco Use   Smoking status: Former   Smokeless tobacco: Never  Substance and Sexual Activity   Alcohol use: Not Currently   Drug use: Not Currently   Sexual activity: Yes  Other Topics Concern   Not on file  Social History Narrative   Not on file   Social Determinants of Health   Financial Resource Strain: Not on file  Food Insecurity: Not on file  Transportation Needs: Not on file  Physical Activity: Not on file  Stress: Not on file  Social Connections: Not on file  Intimate Partner Violence: Not on file      Levert Feinstein, M.D. Ph.D.  [facility] [address] [place], [place] [address] Ph: [phone] Fax: [phone]  CC:  Lance Morin,  MD [address] [place],  [place] [address]  Lance Morin, MD

## 2021-08-02 ENCOUNTER — Inpatient Hospital Stay (HOSPITAL_COMMUNITY)
Admission: AD | Admit: 2021-08-02 | Discharge: 2021-08-02 | Disposition: A | Discharge status: Home / Self Care | Payer: BC Managed Care – PPO | Attending: Obstetrics & Gynecology | Admitting: Obstetrics & Gynecology

## 2021-08-02 ENCOUNTER — Other Ambulatory Visit: Payer: Self-pay

## 2021-08-02 ENCOUNTER — Encounter (HOSPITAL_COMMUNITY): Payer: Self-pay | Admitting: Obstetrics & Gynecology

## 2021-08-02 DIAGNOSIS — O99891 Other specified diseases and conditions complicating pregnancy: Secondary | ICD-10-CM

## 2021-08-02 DIAGNOSIS — B9689 Other specified bacterial agents as the cause of diseases classified elsewhere: Secondary | ICD-10-CM

## 2021-08-02 DIAGNOSIS — Z880 Allergy status to penicillin: Secondary | ICD-10-CM | POA: Insufficient documentation

## 2021-08-02 DIAGNOSIS — Z87891 Personal history of nicotine dependence: Secondary | ICD-10-CM | POA: Diagnosis not present

## 2021-08-02 DIAGNOSIS — N760 Acute vaginitis: Secondary | ICD-10-CM | POA: Diagnosis not present

## 2021-08-02 DIAGNOSIS — O23593 Infection of other part of genital tract in pregnancy, third trimester: Secondary | ICD-10-CM | POA: Insufficient documentation

## 2021-08-02 DIAGNOSIS — Z3A31 31 weeks gestation of pregnancy: Secondary | ICD-10-CM | POA: Diagnosis not present

## 2021-08-02 DIAGNOSIS — O26893 Other specified pregnancy related conditions, third trimester: Secondary | ICD-10-CM | POA: Diagnosis present

## 2021-08-02 LAB — POCT FERN TEST: POCT Fern Test: NEGATIVE

## 2021-08-02 LAB — WET PREP, GENITAL
Sperm: NONE SEEN
Trich, Wet Prep: NONE SEEN
Yeast Wet Prep HPF POC: NONE SEEN

## 2021-08-02 MED ORDER — METRONIDAZOLE 0.75 % VA GEL: 1.0000 | Freq: Every day | VAGINAL | 1 refills | Status: DC

## 2021-08-02 NOTE — MAU Provider Note (Signed)
Chief Complaint:  Rupture of Membranes    HPI: Adriana Harrell is a 30 y.o. [id number] at [redacted]w[redacted]d who presents to maternity admissions reporting leaking fluid. Patient reports at 1630 she was doing laundry and felt a gush of fluid that went through her underwear. She put a pad on and has a few small trickles, however has only worn the same pad. She reports some mild cramping. Denies vaginal bleeding, itching, or odor. Endorses active fetal movement.   Pregnancy Course:   Past Medical History:  Diagnosis Date   Migraine    OB History  Gravida Para Term Preterm AB Living  4 2 2   1 2   SAB IAB Ectopic Multiple Live Births  1       2    # Outcome Date GA Lbr Len/2nd Weight Sex Delivery Anes PTL Lv  4 Current           3 Term 05/31/16     CS-Unspec   LIV  2 Term 07/15/14     CS-Unspec   LIV  1 SAB 2013 [redacted]w[redacted]d          Past Surgical History:  Procedure Laterality Date   CESAREAN SECTION     x2   TONSILLECTOMY     No family history on file. Social History   Tobacco Use   Smoking status: Former   Smokeless tobacco: Never  Substance Use Topics   Alcohol use: Not Currently   Drug use: Not Currently   Allergies  Allergen Reactions   Penicillins Hives   Tamiflu [Oseltamivir Phosphate] Hives   Tamiflu [Oseltamivir] Hives   Medications Prior to Admission  Medication Sig Dispense Refill Last Dose   acetaminophen (TYLENOL) 500 MG tablet Take 2 tablets (1,000 mg total) by mouth every 6 (six) hours. 30 tablet 0 08/02/2021   butalbital-acetaminophen-caffeine (FIORICET) 50-325-40 MG tablet Take 1 tablet by mouth every 6 (six) hours as needed for headache. 12 tablet 5 08/01/2021   butorphanol (STADOL) 10 MG/ML nasal spray Place 1-2 sprays into the nose every 4 (four) hours as needed for headache. 2.5 mL 0 Past Week   magnesium oxide (MAG-OX) 400 (240 Mg) MG tablet Take 2 tablets (800 mg total) by mouth daily. 30 tablet 0 Past Week   Prenatal Vit-Fe Fumarate-FA (PRENATAL MULTIVITAMIN) TABS  tablet Take 1 tablet by mouth daily at 12 noon.   08/02/2021   promethazine (PHENERGAN) 12.5 MG tablet Take 1 tablet (12.5 mg total) by mouth every 6 (six) hours as needed for nausea or vomiting. 30 tablet 0 Past Week   acetaZOLAMIDE ER (DIAMOX) 500 MG capsule Take 3 capsules (1,500 mg total) by mouth 2 (two) times daily. (Patient not taking: Reported on 08/02/2021) 60 capsule 0 Not Taking   zolpidem (AMBIEN) 5 MG tablet Take 1 tablet (5 mg total) by mouth at bedtime as needed for sleep. (Patient not taking: Reported on 08/02/2021) 30 tablet 0 Not Taking    I have reviewed patient's Past Medical Hx, Surgical Hx, Family Hx, Social Hx, medications and allergies.   ROS:  Review of Systems  Constitutional: Negative.   Respiratory: Negative.    Cardiovascular: Negative.   Gastrointestinal:  Positive for abdominal pain (cramping).  Genitourinary:  Positive for vaginal discharge. Negative for dysuria and vaginal bleeding.  Musculoskeletal: Negative.   Neurological: Negative.    Physical Exam  Patient Vitals for the past 24 hrs:  BP Temp Temp src Pulse Resp SpO2 Height Weight  08/02/21 1942 (!) 109/58 98.1 F (  36.7 C) Oral 83 16 100 % 5\' 2"  (1.575 m) 71.1 kg   Constitutional: well-developed, well-nourished female in no acute distress.  Cardiovascular: normal rate Respiratory: normal effort GI: abd soft, non-tender, gravid MS: extremities nontender, no edema, normal ROM Neurologic: alert and oriented x 4.  Pelvic: NEFG, physiologic discharge, no pooling of amniotic fluid, no blood, cervix clean without lesions/masses; visually closed, no CMT Cervix: Closed/thick  FHT: Baseline 130 bpm, moderate variability, 15x15 accelerations present, no decelerations Toco: 2 UC's   Labs: Results for orders placed or performed during the hospital encounter of 08/02/21 (from the past 24 hour(s))  Wet prep, genital     Status: Abnormal   Collection Time: 08/02/21  8:25 PM  Result Value Ref Range   Yeast  Wet Prep HPF POC NONE SEEN NONE SEEN   Trich, Wet Prep NONE SEEN NONE SEEN   Clue Cells Wet Prep HPF POC PRESENT (A) NONE SEEN   WBC, Wet Prep HPF POC MANY (A) NONE SEEN   Sperm NONE SEEN   Fern Test     Status: Normal   Collection Time: 08/02/21  8:31 PM  Result Value Ref Range   POCT Fern Test Negative = intact amniotic membranes     Imaging:    MAU Course: Orders Placed This Encounter  Procedures   Wet prep, genital   Fern Test   Discharge patient   Meds ordered this encounter  Medications   metroNIDAZOLE (METROGEL) 0.75 % vaginal gel    Sig: Place 1 Applicatorful vaginally at bedtime. Apply one applicatorful to vagina at bedtime for 5 days    Dispense:  70 g    Refill:  1    Order Specific Question:   Supervising Provider    Answer:   10/02/21    MDM: Negative pooling, negative fern NST reactive and reassuring Clue cells on wet prep  Assessment: 1. [redacted] weeks gestation of pregnancy   2. BV (bacterial vaginosis)     Plan: Discharge home in stable condition Rx for metronidazole sent to pharmacy  Preterm labor precautions and fetal kick counts reviewed Keep OB appointment as scheduled on Tuesday 10/4 Return to MAU as needed    Follow-up Information     [place] Follow up.   Why: as scheduled on Tuesday. Return to MAU as needed. Contact information: [address] [place] [phone]                 Allergies as of 08/02/2021       Reactions   Penicillins Hives   Tamiflu [oseltamivir Phosphate] Hives   Tamiflu [oseltamivir] Hives        Medication List     TAKE these medications    acetaminophen 500 MG tablet Commonly known as: TYLENOL Take 2 tablets (1,000 mg total) by mouth every 6 (six) hours.   acetaZOLAMIDE ER 500 MG capsule Commonly known as: DIAMOX Take 3 capsules (1,500 mg total) by mouth 2 (two) times daily.   butalbital-acetaminophen-caffeine 50-325-40 MG  tablet Commonly known as: FIORICET Take 1 tablet by mouth every 6 (six) hours as needed for headache.   butorphanol 10 MG/ML nasal spray Commonly known as: STADOL Place 1-2 sprays into the nose every 4 (four) hours as needed for headache.   magnesium oxide 400 (240 Mg) MG tablet Commonly known as: MAG-OX Take 2 tablets (800 mg total) by mouth daily.   metroNIDAZOLE 0.75 % vaginal gel Commonly  known as: METROGEL Place 1 Applicatorful vaginally at bedtime. Apply one applicatorful to vagina at bedtime for 5 days   prenatal multivitamin Tabs tablet Take 1 tablet by mouth daily at 12 noon.   promethazine 12.5 MG tablet Commonly known as: PHENERGAN Take 1 tablet (12.5 mg total) by mouth every 6 (six) hours as needed for nausea or vomiting.   zolpidem 5 MG tablet Commonly known as: AMBIEN Take 1 tablet (5 mg total) by mouth at bedtime as needed for sleep.         Camelia Eng, MSN, CNM 08/02/2021 9:05 PM

## 2021-08-02 NOTE — MAU Note (Signed)
Pt c/o gush of fluid around 1630 with continuous trickles of fluid and cramping. +FM, no bleeding.

## 2021-08-12 ENCOUNTER — Other Ambulatory Visit: Payer: Self-pay

## 2021-08-12 ENCOUNTER — Inpatient Hospital Stay (HOSPITAL_COMMUNITY)
Admission: AD | Admit: 2021-08-12 | Discharge: 2021-08-13 | Disposition: A | Discharge status: Home / Self Care | Payer: BC Managed Care – PPO | Attending: Obstetrics and Gynecology | Admitting: Obstetrics and Gynecology

## 2021-08-12 ENCOUNTER — Encounter (HOSPITAL_COMMUNITY): Payer: Self-pay | Admitting: Obstetrics and Gynecology

## 2021-08-12 DIAGNOSIS — K529 Noninfective gastroenteritis and colitis, unspecified: Secondary | ICD-10-CM

## 2021-08-12 DIAGNOSIS — Z20822 Contact with and (suspected) exposure to covid-19: Secondary | ICD-10-CM | POA: Insufficient documentation

## 2021-08-12 DIAGNOSIS — Z3A33 33 weeks gestation of pregnancy: Secondary | ICD-10-CM | POA: Insufficient documentation

## 2021-08-12 DIAGNOSIS — O26893 Other specified pregnancy related conditions, third trimester: Secondary | ICD-10-CM | POA: Diagnosis not present

## 2021-08-12 DIAGNOSIS — O212 Late vomiting of pregnancy: Secondary | ICD-10-CM | POA: Diagnosis not present

## 2021-08-12 DIAGNOSIS — Z880 Allergy status to penicillin: Secondary | ICD-10-CM | POA: Insufficient documentation

## 2021-08-12 DIAGNOSIS — O34219 Maternal care for unspecified type scar from previous cesarean delivery: Secondary | ICD-10-CM | POA: Diagnosis not present

## 2021-08-12 DIAGNOSIS — R509 Fever, unspecified: Secondary | ICD-10-CM | POA: Diagnosis not present

## 2021-08-12 DIAGNOSIS — Z888 Allergy status to other drugs, medicaments and biological substances status: Secondary | ICD-10-CM | POA: Diagnosis not present

## 2021-08-12 DIAGNOSIS — O4703 False labor before 37 completed weeks of gestation, third trimester: Secondary | ICD-10-CM

## 2021-08-12 DIAGNOSIS — R112 Nausea with vomiting, unspecified: Secondary | ICD-10-CM

## 2021-08-12 DIAGNOSIS — O99891 Other specified diseases and conditions complicating pregnancy: Secondary | ICD-10-CM | POA: Diagnosis not present

## 2021-08-12 DIAGNOSIS — N858 Other specified noninflammatory disorders of uterus: Secondary | ICD-10-CM | POA: Insufficient documentation

## 2021-08-12 DIAGNOSIS — R109 Unspecified abdominal pain: Secondary | ICD-10-CM | POA: Diagnosis not present

## 2021-08-12 DIAGNOSIS — O4700 False labor before 37 completed weeks of gestation, unspecified trimester: Secondary | ICD-10-CM

## 2021-08-12 DIAGNOSIS — O219 Vomiting of pregnancy, unspecified: Secondary | ICD-10-CM | POA: Diagnosis present

## 2021-08-12 DIAGNOSIS — O99283 Endocrine, nutritional and metabolic diseases complicating pregnancy, third trimester: Secondary | ICD-10-CM | POA: Diagnosis not present

## 2021-08-12 DIAGNOSIS — E876 Hypokalemia: Secondary | ICD-10-CM

## 2021-08-12 DIAGNOSIS — Z87891 Personal history of nicotine dependence: Secondary | ICD-10-CM | POA: Insufficient documentation

## 2021-08-12 LAB — URINALYSIS, ROUTINE W REFLEX MICROSCOPIC
Bilirubin Urine: NEGATIVE
Glucose, UA: NEGATIVE mg/dL
Hgb urine dipstick: NEGATIVE
Ketones, ur: NEGATIVE mg/dL
Leukocytes,Ua: NEGATIVE
Nitrite: NEGATIVE
Protein, ur: NEGATIVE mg/dL
Specific Gravity, Urine: 1.004 — ABNORMAL LOW (ref 1.005–1.030)
pH: 7 (ref 5.0–8.0)

## 2021-08-12 LAB — COMPREHENSIVE METABOLIC PANEL
ALT: 14 U/L (ref 0–44)
AST: 23 U/L (ref 15–41)
Albumin: 2.4 g/dL — ABNORMAL LOW (ref 3.5–5.0)
Alkaline Phosphatase: 132 U/L — ABNORMAL HIGH (ref 38–126)
Anion gap: 10 (ref 5–15)
BUN: <5 mg/dL — ABNORMAL LOW (ref 6–20)
CO2: 19 mmol/L — ABNORMAL LOW (ref 22–32)
Calcium: 8.1 mg/dL — ABNORMAL LOW (ref 8.9–10.3)
Chloride: 106 mmol/L (ref 98–111)
Creatinine, Ser: 0.44 mg/dL (ref 0.44–1.00)
GFR, Estimated: >60 mL/min (ref >60)
Glucose, Bld: 86 mg/dL (ref 70–99)
Potassium: 3.1 mmol/L — ABNORMAL LOW (ref 3.5–5.1)
Sodium: 135 mmol/L (ref 135–145)
Total Bilirubin: 0.3 mg/dL (ref 0.3–1.2)
Total Protein: 5 g/dL — ABNORMAL LOW (ref 6.5–8.1)

## 2021-08-12 LAB — CBC
HCT: 26.5 % — ABNORMAL LOW (ref 36.0–46.0)
Hemoglobin: 8.6 g/dL — ABNORMAL LOW (ref 12.0–15.0)
MCH: 27.7 pg (ref 26.0–34.0)
MCHC: 32.5 g/dL (ref 30.0–36.0)
MCV: 85.2 fL (ref 80.0–100.0)
Platelets: 189 10*3/uL (ref 150–400)
RBC: 3.11 MIL/uL — ABNORMAL LOW (ref 3.87–5.11)
RDW: 14.3 % (ref 11.5–15.5)
WBC: 10.9 10*3/uL — ABNORMAL HIGH (ref 4.0–10.5)
nRBC: 0 % (ref 0.0–0.2)

## 2021-08-12 LAB — RESP PANEL BY RT-PCR (FLU A&B, COVID) ARPGX2
Influenza A by PCR: NEGATIVE
Influenza B by PCR: NEGATIVE
SARS Coronavirus 2 by RT PCR: NEGATIVE

## 2021-08-12 MED ORDER — LACTATED RINGERS IV SOLN: Freq: Once | INTRAVENOUS | Status: AC

## 2021-08-12 MED ORDER — HYOSCYAMINE SULFATE 0.125 MG SL SUBL
0.1250 mg | SUBLINGUAL_TABLET | Freq: Once | SUBLINGUAL | Status: AC
  Administered 2021-08-12: 0.125 mg via SUBLINGUAL
  Filled 2021-08-12: qty 1

## 2021-08-12 MED ORDER — ONDANSETRON HCL 4 MG/2ML IJ SOLN
4.0000 mg | Freq: Once | INTRAMUSCULAR | Status: AC
  Administered 2021-08-13: 4 mg via INTRAVENOUS
  Filled 2021-08-12: qty 2

## 2021-08-12 MED ORDER — SODIUM CHLORIDE 0.9 % IV SOLN
12.5000 mg | Freq: Once | INTRAVENOUS | Status: AC
  Administered 2021-08-12: 12.5 mg via INTRAVENOUS
  Filled 2021-08-12: qty 12.5

## 2021-08-12 NOTE — MAU Note (Signed)
..  Adriana Harrell is a 30 y.o. at [redacted]w[redacted]d here in MAU reporting: Nausea since yesterday and vomiting today. Patient states "I just don't feel good." Constant right sided abdominal pain that feels dull and achy that started this morning. Reports occasional contractions. +FM. Denies vaginal bleeding or leaking of fluid.  Took tylenol around 7:30pm nausea medicine around 2:30pm No one around her has been sick.  Onset of complaint: Yesterday Pain score: right sided pain 6/10. Ctx 3/10 Vitals:   08/12/21 2152  BP: 120/61  Pulse: (!) 111  Resp: 20  Temp: 98.1 F (36.7 C)  SpO2: 98%     FHT:133 Lab orders placed from triage: UA

## 2021-08-12 NOTE — MAU Provider Note (Signed)
Chief Complaint:  Nausea and Emesis   Event Date/Time   First Provider Initiated Contact with Patient 08/12/21 2224     HPI: Adriana Harrell is a 30 y.o. [id number] at 38w1dwho presents to maternity admissions reporting pain in right middle/lower abdomen, nausea, vomiting, and fever.  She reports good fetal movement, denies LOF, vaginal bleeding, urinary symptoms, h/a, dizziness, diarrhea, constipation or chills.  .  Emesis  This is a new problem. The current episode started today. The problem has been unchanged. Associated symptoms include abdominal pain and a fever (subjective). Pertinent negatives include no chest pain, chills, coughing, diarrhea or myalgias. She has tried acetaminophen for the symptoms. The treatment provided no relief.   RN Note: Adriana Harrell is a 30 y.o. at [redacted]w[redacted]d here in MAU reporting: Nausea since yesterday and vomiting today. Patient states "I just don't feel good." Constant right sided abdominal pain that feels dull and achy that started this morning. Reports occasional contractions. +FM. Denies vaginal bleeding or leaking of fluid.  Took tylenol around 7:30pm nausea medicine around 2:30pm No one around her has been sick.  Onset of complaint: Yesterday Pain score: right sided pain 6/10. Ctx 3/10  Past Medical History: Past Medical History:  Diagnosis Date   Migraine     Past obstetric history: OB History  Gravida Para Term Preterm AB Living  4 2 2   1 2   SAB IAB Ectopic Multiple Live Births  1       2    # Outcome Date GA Lbr Len/2nd Weight Sex Delivery Anes PTL Lv  4 Current           3 Term 05/31/16     CS-Unspec   LIV  2 Term 07/15/14     CS-Unspec   LIV  1 SAB 2013 [redacted]w[redacted]d           Past Surgical History: Past Surgical History:  Procedure Laterality Date   CESAREAN SECTION     x2   TONSILLECTOMY      Family History: Family History  Problem Relation Age of Onset   Cancer Mother     Social History: Social History   Tobacco Use   Smoking  status: Former   Smokeless tobacco: Never  Substance Use Topics   Alcohol use: Not Currently   Drug use: Not Currently    Allergies:  Allergies  Allergen Reactions   Penicillins Hives   Tamiflu [Oseltamivir Phosphate] Hives   Tamiflu [Oseltamivir] Hives    Meds:  Medications Prior to Admission  Medication Sig Dispense Refill Last Dose   acetaminophen (TYLENOL) 500 MG tablet Take 2 tablets (1,000 mg total) by mouth every 6 (six) hours. 30 tablet 0 08/12/2021 at 1973   butalbital-acetaminophen-caffeine (FIORICET) 50-325-40 MG tablet Take 1 tablet by mouth every 6 (six) hours as needed for headache. 12 tablet 5 Past Week   butorphanol (STADOL) 10 MG/ML nasal spray Place 1-2 sprays into the nose every 4 (four) hours as needed for headache. 2.5 mL 0 Past Month   Prenatal Vit-Fe Fumarate-FA (PRENATAL MULTIVITAMIN) TABS tablet Take 1 tablet by mouth daily at 12 noon.   08/12/2021 at 0730   promethazine (PHENERGAN) 12.5 MG tablet Take 1 tablet (12.5 mg total) by mouth every 6 (six) hours as needed for nausea or vomiting. 30 tablet 0 08/12/2021 at 1430   acetaZOLAMIDE ER (DIAMOX) 500 MG capsule Take 3 capsules (1,500 mg total) by mouth 2 (two) times daily. (Patient not taking: No sig reported) 60  capsule 0 Unknown   magnesium oxide (MAG-OX) 400 (240 Mg) MG tablet Take 2 tablets (800 mg total) by mouth daily. 30 tablet 0 Unknown   metroNIDAZOLE (METROGEL) 0.75 % vaginal gel Place 1 Applicatorful vaginally at bedtime. Apply one applicatorful to vagina at bedtime for 5 days 70 g 1 Unknown   zolpidem (AMBIEN) 5 MG tablet Take 1 tablet (5 mg total) by mouth at bedtime as needed for sleep. (Patient not taking: No sig reported) 30 tablet 0 Unknown    I have reviewed patient's Past Medical Hx, Surgical Hx, Family Hx, Social Hx, medications and allergies.   ROS:  Review of Systems  Constitutional:  Positive for fever (subjective). Negative for chills.  Respiratory:  Negative for cough.    Cardiovascular:  Negative for chest pain.  Gastrointestinal:  Positive for abdominal pain and vomiting. Negative for diarrhea.  Musculoskeletal:  Negative for myalgias.  Other systems negative  Physical Exam  Patient Vitals for the past 24 hrs:  BP Temp Temp src Pulse Resp SpO2 Height Weight  08/12/21 2152 120/61 98.1 F (36.7 C) Oral (!) 111 20 98 % 5\' 2"  (1.575 m) 71.8 kg   Constitutional: Well-developed, well-nourished female in no acute distress.  Cardiovascular: normal rate and rhythm Respiratory: normal effort, clear to auscultation bilaterally GI: Abd soft, non-tender, gravid appropriate for gestational age.   No rebound or guarding. MS: Extremities nontender, no edema, normal ROM Neurologic: Alert and oriented x 4.  GU: Neg CVAT.  PELVIC EXAM: Dilation: Closed Effacement (%): Thick Cervical Position: Posterior Station: Ballotable Exam by:: [email or web address], CNM   FHT:  Baseline 140 , moderate variability, accelerations present, no decelerations Contractions: q 2-5 mins Irregular    Labs: Results for orders placed or performed during the hospital encounter of 08/12/21 (from the past 24 hour(s))  Urinalysis, Routine w reflex microscopic Urine, Clean Catch     Status: Abnormal   Collection Time: 08/12/21  9:56 PM  Result Value Ref Range   Color, Urine YELLOW YELLOW   APPearance CLOUDY (A) CLEAR   Specific Gravity, Urine 1.004 (L) 1.005 - 1.030   pH 7.0 5.0 - 8.0   Glucose, UA NEGATIVE NEGATIVE mg/dL   Hgb urine dipstick NEGATIVE NEGATIVE   Bilirubin Urine NEGATIVE NEGATIVE   Ketones, ur NEGATIVE NEGATIVE mg/dL   Protein, ur NEGATIVE NEGATIVE mg/dL   Nitrite NEGATIVE NEGATIVE   Leukocytes,Ua NEGATIVE NEGATIVE  CBC     Status: Abnormal   Collection Time: 08/12/21 10:24 PM  Result Value Ref Range   WBC 10.9 (H) 4.0 - 10.5 K/uL   RBC 3.11 (L) 3.87 - 5.11 MIL/uL   Hemoglobin 8.6 (L) 12.0 - 15.0 g/dL   HCT 10/12/21 (L) 93.7 - 16.9 %   MCV 85.2 80.0 - 100.0 fL   MCH 27.7  26.0 - 34.0 pg   MCHC 32.5 30.0 - 36.0 g/dL   RDW 67.8 93.8 - 10.1 %   Platelets 189 150 - 400 K/uL   nRBC 0.0 0.0 - 0.2 %  Comprehensive metabolic panel     Status: Abnormal   Collection Time: 08/12/21 10:24 PM  Result Value Ref Range   Sodium 135 135 - 145 mmol/L   Potassium 3.1 (L) 3.5 - 5.1 mmol/L   Chloride 106 98 - 111 mmol/L   CO2 19 (L) 22 - 32 mmol/L   Glucose, Bld 86 70 - 99 mg/dL   BUN <5 (L) 6 - 20 mg/dL   Creatinine, Ser 10/12/21 0.44 - 1.00 mg/dL  Calcium 8.1 (L) 8.9 - 10.3 mg/dL   Total Protein 5.0 (L) 6.5 - 8.1 g/dL   Albumin 2.4 (L) 3.5 - 5.0 g/dL   AST 23 15 - 41 U/L   ALT 14 0 - 44 U/L   Alkaline Phosphatase 132 (H) 38 - 126 U/L   Total Bilirubin 0.3 0.3 - 1.2 mg/dL   GFR, Estimated >16 >07 mL/min   Anion gap 10 5 - 15  Resp Panel by RT-PCR (Flu A&B, Covid) Nasopharyngeal Swab     Status: None   Collection Time: 08/12/21 10:25 PM   Specimen: Nasopharyngeal Swab; Nasopharyngeal(NP) swabs in vial transport medium  Result Value Ref Range   SARS Coronavirus 2 by RT PCR NEGATIVE NEGATIVE   Influenza A by PCR NEGATIVE NEGATIVE   Influenza B by PCR NEGATIVE NEGATIVE  Fetal fibronectin     Status: None   Collection Time: 08/12/21 11:51 PM  Result Value Ref Range   Fetal Fibronectin NEGATIVE NEGATIVE    --/--/O NEG (09/09 1556)  Imaging:  MR PELVIS WO CONTRAST  Result Date: 08/13/2021 CLINICAL DATA:  Right lower quadrant abdominal pain, fever, nausea, vomiting, diarrhea. 31 weeks 5 days pregnant. EXAM: MRI ABDOMEN AND PELVIS WITHOUT CONTRAST TECHNIQUE: Multiplanar multisequence MR imaging of the abdomen and pelvis was performed. No intravenous contrast was administered. COMPARISON:  None. FINDINGS: COMBINED FINDINGS FOR BOTH MR ABDOMEN AND PELVIS Lower chest: No acute findings. Hepatobiliary: No mass or other parenchymal abnormality identified. Pancreas: No mass, inflammatory changes, or other parenchymal abnormality identified. Spleen:  Within normal limits in size  and appearance. Adrenals/Urinary Tract: No masses identified. No evidence of hydronephrosis. Stomach/Bowel: The appendix is well visualized on image # 40, series 38 and image # 33, series 6 and is normal. No periappendiceal edema identified. The small and large bowel are unremarkable. No free fluid. Vascular/Lymphatic: No pathologically enlarged lymph nodes identified. No abdominal aortic aneurysm demonstrated. Reproductive: Single fetus identified within the gravid uterus. The fetus is in breech presentation. The placenta is seen within the anterior fundus and is separate from the internal cervical os. No separation or retroplacental fluid collection. The cervix is closed without evidence of funneling. Other:  None Musculoskeletal: No suspicious bone lesions identified. IMPRESSION: No acute intra-abdominal pathology identified. No definite radiographic explanation for the patient's reported symptoms. Normal appendix. Singleton gestation with the fetus in a breech presentation. Electronically Signed   By: Helyn Numbers M.D.   On: 08/13/2021 02:32   MR ABDOMEN WO CONTRAST  Result Date: 08/13/2021 CLINICAL DATA:  Right lower quadrant abdominal pain, fever, nausea, vomiting, diarrhea. 31 weeks 5 days pregnant. EXAM: MRI ABDOMEN AND PELVIS WITHOUT CONTRAST TECHNIQUE: Multiplanar multisequence MR imaging of the abdomen and pelvis was performed. No intravenous contrast was administered. COMPARISON:  None. FINDINGS: COMBINED FINDINGS FOR BOTH MR ABDOMEN AND PELVIS Lower chest: No acute findings. Hepatobiliary: No mass or other parenchymal abnormality identified. Pancreas: No mass, inflammatory changes, or other parenchymal abnormality identified. Spleen:  Within normal limits in size and appearance. Adrenals/Urinary Tract: No masses identified. No evidence of hydronephrosis. Stomach/Bowel: The appendix is well visualized on image # 40, series 38 and image # 33, series 6 and is normal. No periappendiceal edema  identified. The small and large bowel are unremarkable. No free fluid. Vascular/Lymphatic: No pathologically enlarged lymph nodes identified. No abdominal aortic aneurysm demonstrated. Reproductive: Single fetus identified within the gravid uterus. The fetus is in breech presentation. The placenta is seen within the anterior fundus and is separate from the  internal cervical os. No separation or retroplacental fluid collection. The cervix is closed without evidence of funneling. Other:  None Musculoskeletal: No suspicious bone lesions identified. IMPRESSION: No acute intra-abdominal pathology identified. No definite radiographic explanation for the patient's reported symptoms. Normal appendix. Singleton gestation with the fetus in a breech presentation. Electronically Signed   By: Helyn Numbers M.D.   On: 08/13/2021 02:32     MAU Course/MDM: I have ordered labs and reviewed results. No significant leukocytosis.  Other labs negative.  Covid and flu negative.  Potassium noted to be slightly low.  PO potassium given and she was able to tolerate it, so we decided to forego the IV supplement which wouldve taken 2 more hours.  MRI states appendix is normal  NST reviewed, reactive with contractions.  They are not very painful to pt  FFn sent and was negative.  Cervix was long and closed.  Gave one dose of Procardia with reduction in UCs.  Consult Dr Vergie Living with presentation, exam findings and test results.  Treatments in MAU included IVFs, antiemetics. analgesia.    Assessment: Single IUP at [redacted]w[redacted]d Abdominal pain Mild hypokalemia Nausea and vomiting Preterm uterine contractions Probable gastroenteritis  Plan: Discharge home Has Rx for Phenergan at home Rx K-Dur daily x 2days Advance diet as tolerated PO fluids Preterm Labor precautions and fetal kick counts Follow up in Office for prenatal visits  Encouraged to return if she develops worsening of symptoms, increase in pain, fever, or other  concerning symptoms.  Pt stable at time of discharge.  Wynelle Bourgeois CNM, MSN Certified Nurse-Midwife 08/12/2021 10:24 PM

## 2021-08-13 ENCOUNTER — Other Ambulatory Visit: Payer: Self-pay | Admitting: Advanced Practice Midwife

## 2021-08-13 ENCOUNTER — Inpatient Hospital Stay (HOSPITAL_COMMUNITY): Payer: BC Managed Care – PPO

## 2021-08-13 LAB — FETAL FIBRONECTIN: Fetal Fibronectin: NEGATIVE

## 2021-08-13 IMAGING — MR MR ABDOMEN W/O CM
13 of 14 series · 39 of 48 positions shown · non-contrast
Comparison: None.

CLINICAL DATA: Right lower quadrant abdominal pain, fever, nausea,
vomiting, diarrhea. 31 weeks 5 days pregnant.

EXAM:
MRI ABDOMEN AND PELVIS WITHOUT CONTRAST
TECHNIQUE: Multiplanar multisequence MR imaging of the abdomen and pelvis was
performed. No intravenous contrast was administered.

[Series 6: cor haste · coronal · 5.0mm · 1.17mm/px · 3 of 50 slices shown]
[im 1/50]
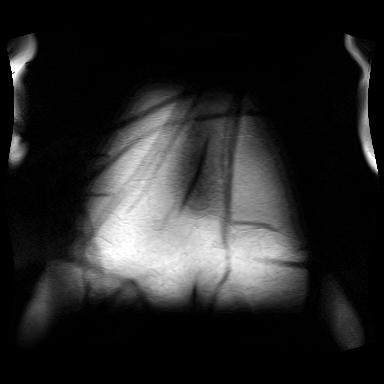
[im 25/50]
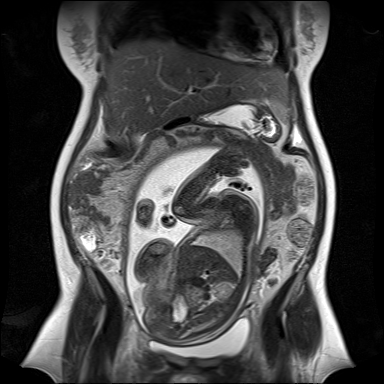
[im 50/50]
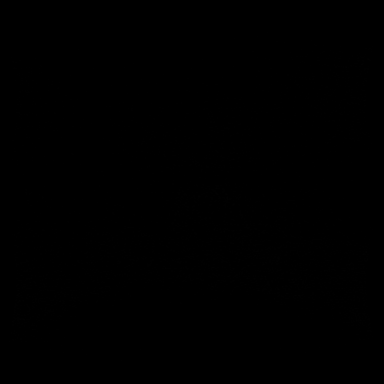

[Series 10: bSSFP · coronal · 5.0mm · 2.01mm/px · 3 of 50 slices shown (1 of 3)]
[im 1/50]
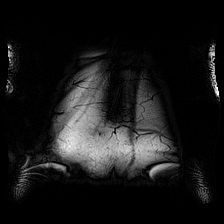
[im 25/50]
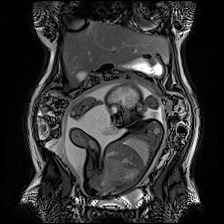
[im 50/50]
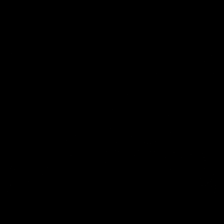

[Series 15: ax haste_comp · axial · 5.0mm · 0.99mm/px · z∈[+130,+346]mm · 2 of 37 slices shown (1 of 2)]
[im 1/37]
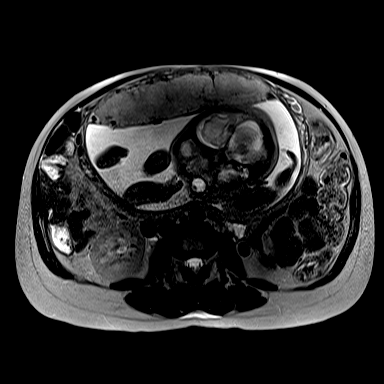
[im 37/37]
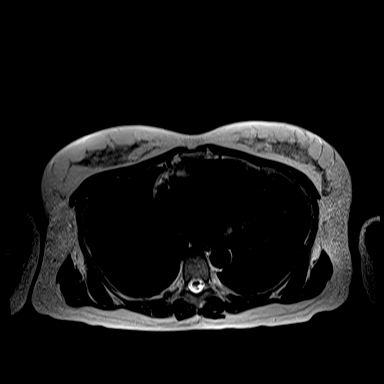

[Series 15: ax haste_comp · axial · 5.0mm · 1.19mm/px · z∈[-90,+126]mm · 2 of 37 slices shown (2 of 2)]
[im 1/37]
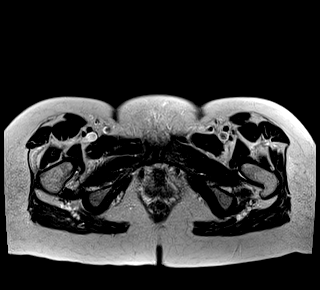
[im 37/37]
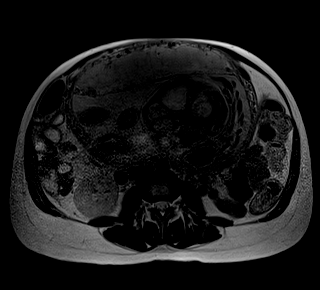

[Series 20: ax haste fs_comp · axial · 5.0mm · 0.99mm/px · z∈[+130,+346]mm · 3 of 37 slices shown (1 of 2)]
[im 1/37]
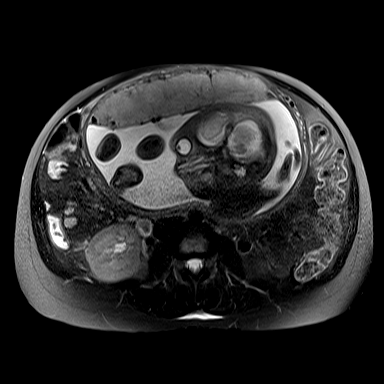
[im 19/37]
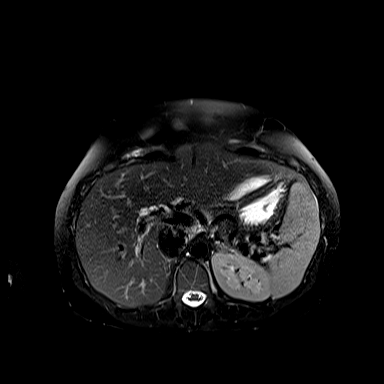
[im 37/37]
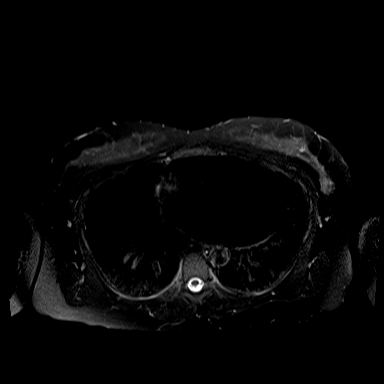

[Series 20: ax haste fs_comp · axial · 5.0mm · 1.19mm/px · z∈[-90,+126]mm · 2 of 37 slices shown (2 of 2)]
[im 1/37]
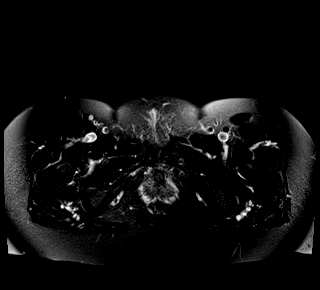
[im 37/37]
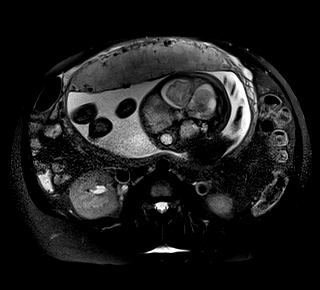

[Series 21: cor haste fs · coronal · 5.0mm · 1.17mm/px · 4 of 50 slices shown]
[im 1/50]
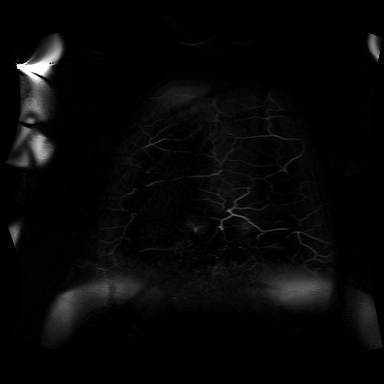
[im 17/50]
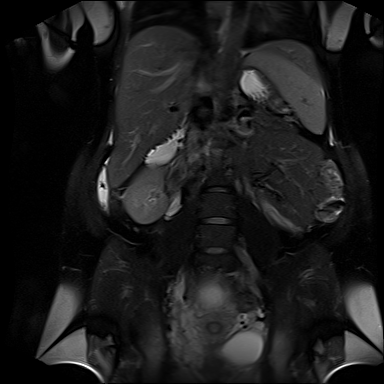
[im 33/50]
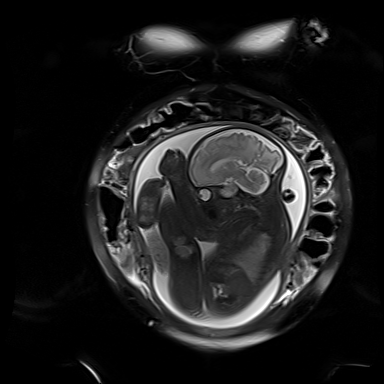
[im 50/50]
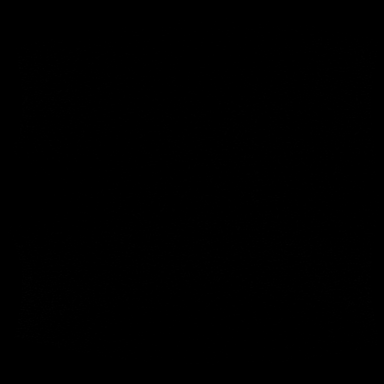

[Series 27: bSSFP · axial · 5.0mm · 1.61mm/px · z∈[+130,+346]mm · 3 of 37 slices shown (2 of 3)]
[im 1/37]
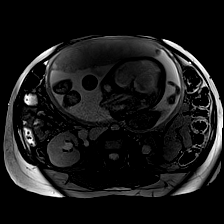
[im 19/37]
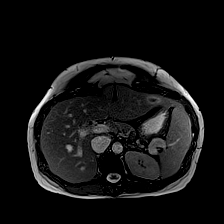
[im 37/37]
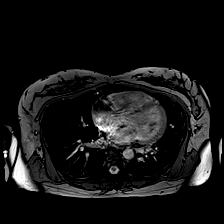

[Series 28: bSSFP · axial · 5.0mm · 0.74mm/px · z∈[-90,+126]mm · 3 of 37 slices shown (3 of 3)]
[im 1/37]
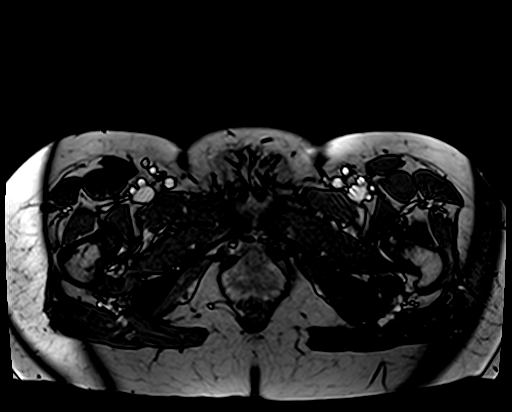
[im 19/37]
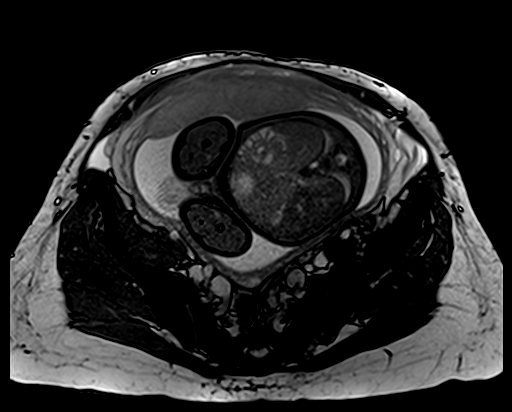
[im 37/37]
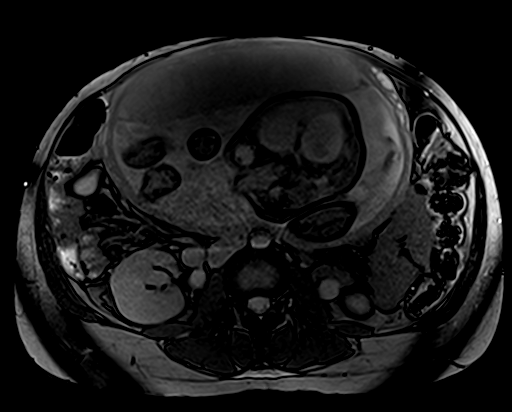

[Series 32: T1 · axial · 5.0mm · 1.48mm/px · z∈[-90,+126]mm · 3 of 37 slices shown (1 of 2)]
[im 1/37]
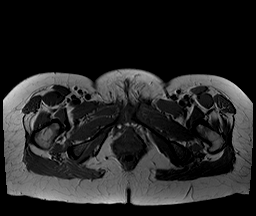
[im 19/37]
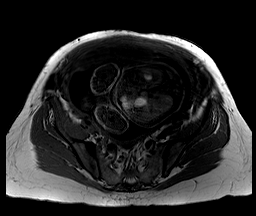
[im 37/37]
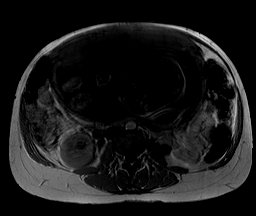

[Series 32: T1 · axial · 5.0mm · 0.70mm/px · z∈[+130,+346]mm · 3 of 37 slices shown (2 of 2)]
[im 1/37]
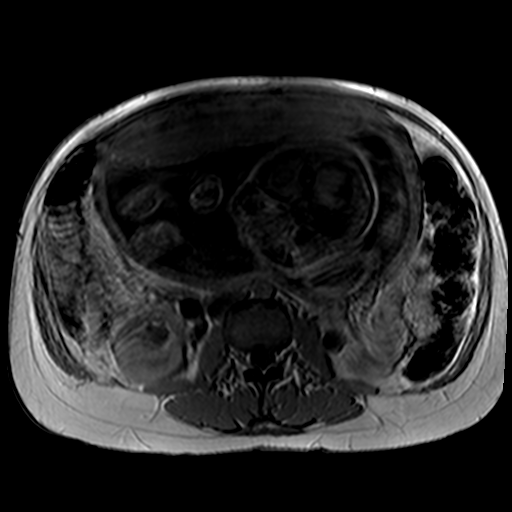
[im 19/37]
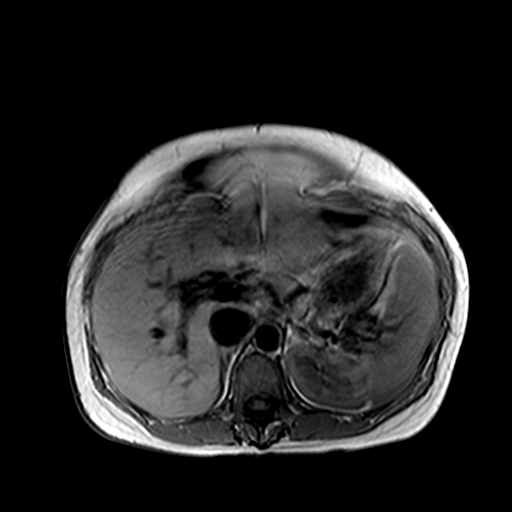
[im 37/37]
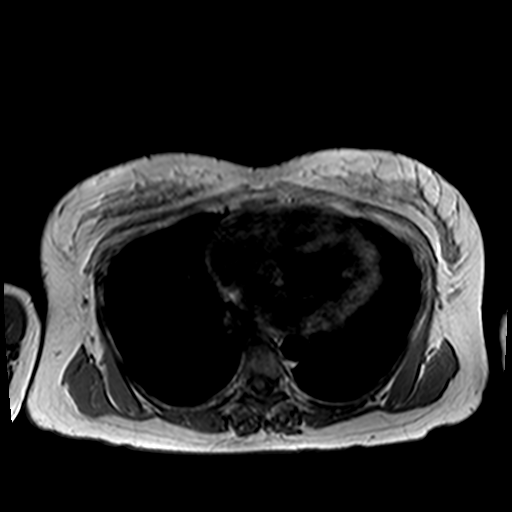

[Series 38: T2 fat-sat · axial · 5.0mm · 1.19mm/px · z∈[-84,+337]mm · 5 of 71 slices shown]
[im 1/71]
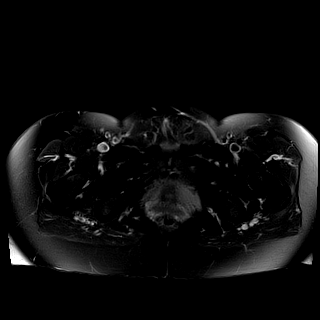
[im 18/71]
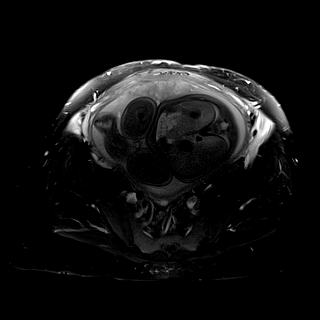
[im 36/71]
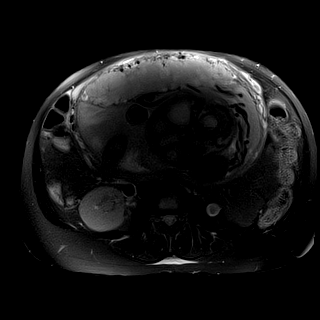
[im 53/71]
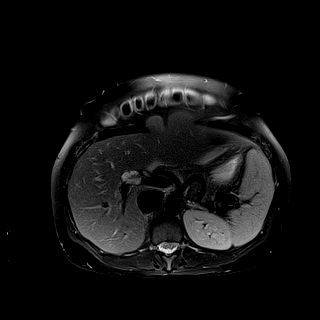
[im 71/71]
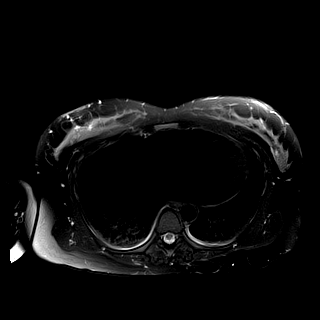

[Series 39: T1 dynamic · axial · 3.0mm · 1.41mm/px · z∈[+82,+184]mm · 3 of 88 slices shown]
[im 1/88]
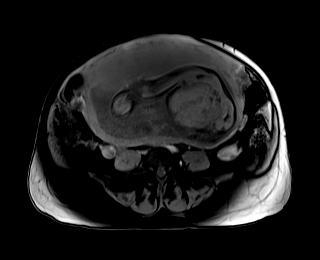
[im 18/88]
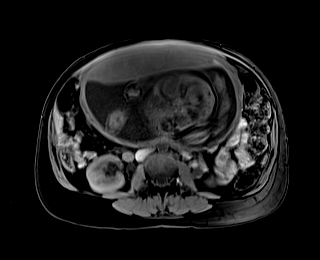
[im 35/88]
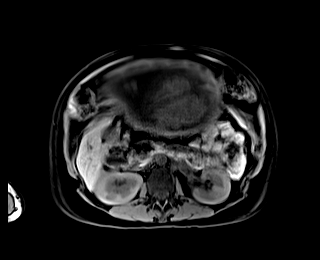

[39 of 48 positions shown; findings below may reference images not displayed]

FINDINGS: COMBINED FINDINGS FOR BOTH MR ABDOMEN AND PELVIS

Lower chest: No acute findings.

Hepatobiliary: No mass or other parenchymal abnormality identified.

Pancreas: No mass, inflammatory changes, or other parenchymal
abnormality identified.

Spleen:  Within normal limits in size and appearance.

Adrenals/Urinary Tract: No masses identified. No evidence of
hydronephrosis.

Stomach/Bowel: The appendix is well visualized on image # 40, series
38 and image # 33, series 6 and is normal. No periappendiceal edema
identified. The small and large bowel are unremarkable. No free
fluid.

Vascular/Lymphatic: No pathologically enlarged lymph nodes
identified. No abdominal aortic aneurysm demonstrated.

Reproductive: Single fetus identified within the gravid uterus. The
fetus is in breech presentation. The placenta is seen within the
anterior fundus and is separate from the internal cervical os. No
separation or retroplacental fluid collection. The cervix is closed
without evidence of funneling.

Other:  None

Musculoskeletal: No suspicious bone lesions identified.
IMPRESSION: No acute intra-abdominal pathology identified. No definite
radiographic explanation for the patient's reported symptoms. Normal
appendix.

Singleton gestation with the fetus in a breech presentation.

## 2021-08-13 MED ORDER — NIFEDIPINE 10 MG PO CAPS
10.0000 mg | ORAL_CAPSULE | ORAL | Status: DC | PRN
  Administered 2021-08-13: 10 mg via ORAL
  Filled 2021-08-13 (×2): qty 1

## 2021-08-13 MED ORDER — POTASSIUM CHLORIDE CRYS ER 20 MEQ PO TBCR: 20.0000 meq | EXTENDED_RELEASE_TABLET | Freq: Every day | ORAL | 0 refills | Status: DC

## 2021-08-13 MED ORDER — KCL-LACTATED RINGERS-D5W 20 MEQ/L IV SOLN
Freq: Once | INTRAVENOUS | Status: DC
  Filled 2021-08-13: qty 1000

## 2021-08-13 MED ORDER — HYDROMORPHONE HCL 1 MG/ML IJ SOLN
1.0000 mg | Freq: Once | INTRAMUSCULAR | Status: AC
  Administered 2021-08-13: 1 mg via INTRAVENOUS
  Filled 2021-08-13: qty 1

## 2021-08-13 MED ORDER — POTASSIUM CHLORIDE CRYS ER 10 MEQ PO TBCR
10.0000 meq | EXTENDED_RELEASE_TABLET | Freq: Once | ORAL | Status: AC
  Administered 2021-08-13: 10 meq via ORAL
  Filled 2021-08-13: qty 1

## 2021-08-13 NOTE — Progress Notes (Signed)
K-Lor sent for potassium supplement

## 2021-08-14 ENCOUNTER — Other Ambulatory Visit: Payer: Self-pay | Admitting: Advanced Practice Midwife

## 2021-08-15 ENCOUNTER — Other Ambulatory Visit: Payer: Self-pay | Admitting: Advanced Practice Midwife

## 2021-08-16 ENCOUNTER — Other Ambulatory Visit: Payer: Self-pay | Admitting: Advanced Practice Midwife

## 2021-08-17 ENCOUNTER — Other Ambulatory Visit: Payer: Self-pay | Admitting: Advanced Practice Midwife

## 2021-08-18 ENCOUNTER — Other Ambulatory Visit: Payer: Self-pay | Admitting: Advanced Practice Midwife

## 2021-08-19 ENCOUNTER — Other Ambulatory Visit: Payer: Self-pay | Admitting: Advanced Practice Midwife

## 2021-08-20 ENCOUNTER — Other Ambulatory Visit: Payer: Self-pay | Admitting: Advanced Practice Midwife

## 2021-08-21 ENCOUNTER — Other Ambulatory Visit: Payer: Self-pay | Admitting: Advanced Practice Midwife

## 2021-08-22 ENCOUNTER — Other Ambulatory Visit: Payer: Self-pay | Admitting: Advanced Practice Midwife

## 2021-08-24 ENCOUNTER — Other Ambulatory Visit: Payer: Self-pay | Admitting: Advanced Practice Midwife

## 2021-08-25 ENCOUNTER — Other Ambulatory Visit: Payer: Self-pay | Admitting: Advanced Practice Midwife

## 2021-09-04 ENCOUNTER — Inpatient Hospital Stay (HOSPITAL_COMMUNITY)
Admission: AD | Admit: 2021-09-04 | Discharge: 2021-09-04 | Disposition: A | Discharge status: Home / Self Care | Payer: BC Managed Care – PPO | Attending: Obstetrics and Gynecology | Admitting: Obstetrics and Gynecology

## 2021-09-04 ENCOUNTER — Other Ambulatory Visit: Payer: Self-pay

## 2021-09-04 ENCOUNTER — Encounter (HOSPITAL_COMMUNITY): Payer: Self-pay | Admitting: Obstetrics and Gynecology

## 2021-09-04 ENCOUNTER — Inpatient Hospital Stay (HOSPITAL_COMMUNITY): Payer: BC Managed Care – PPO

## 2021-09-04 DIAGNOSIS — Z20822 Contact with and (suspected) exposure to covid-19: Secondary | ICD-10-CM | POA: Insufficient documentation

## 2021-09-04 DIAGNOSIS — B349 Viral infection, unspecified: Secondary | ICD-10-CM | POA: Diagnosis not present

## 2021-09-04 DIAGNOSIS — O98513 Other viral diseases complicating pregnancy, third trimester: Secondary | ICD-10-CM | POA: Insufficient documentation

## 2021-09-04 DIAGNOSIS — O368130 Decreased fetal movements, third trimester, not applicable or unspecified: Secondary | ICD-10-CM | POA: Diagnosis not present

## 2021-09-04 DIAGNOSIS — O98812 Other maternal infectious and parasitic diseases complicating pregnancy, second trimester: Secondary | ICD-10-CM

## 2021-09-04 DIAGNOSIS — R0602 Shortness of breath: Secondary | ICD-10-CM | POA: Diagnosis present

## 2021-09-04 DIAGNOSIS — Z3A26 26 weeks gestation of pregnancy: Secondary | ICD-10-CM

## 2021-09-04 DIAGNOSIS — Z3A36 36 weeks gestation of pregnancy: Secondary | ICD-10-CM | POA: Diagnosis not present

## 2021-09-04 LAB — COMPREHENSIVE METABOLIC PANEL
ALT: 11 U/L (ref 0–44)
AST: 20 U/L (ref 15–41)
Albumin: 2.7 g/dL — ABNORMAL LOW (ref 3.5–5.0)
Alkaline Phosphatase: 169 U/L — ABNORMAL HIGH (ref 38–126)
Anion gap: 8 (ref 5–15)
BUN: <5 mg/dL — ABNORMAL LOW (ref 6–20)
CO2: 23 mmol/L (ref 22–32)
Calcium: 8.6 mg/dL — ABNORMAL LOW (ref 8.9–10.3)
Chloride: 102 mmol/L (ref 98–111)
Creatinine, Ser: 0.5 mg/dL (ref 0.44–1.00)
GFR, Estimated: >60 mL/min (ref >60)
Glucose, Bld: 87 mg/dL (ref 70–99)
Potassium: 3.3 mmol/L — ABNORMAL LOW (ref 3.5–5.1)
Sodium: 133 mmol/L — ABNORMAL LOW (ref 135–145)
Total Bilirubin: 0.7 mg/dL (ref 0.3–1.2)
Total Protein: 6 g/dL — ABNORMAL LOW (ref 6.5–8.1)

## 2021-09-04 LAB — CBC
HCT: 32.1 % — ABNORMAL LOW (ref 36.0–46.0)
Hemoglobin: 10.3 g/dL — ABNORMAL LOW (ref 12.0–15.0)
MCH: 27.4 pg (ref 26.0–34.0)
MCHC: 32.1 g/dL (ref 30.0–36.0)
MCV: 85.4 fL (ref 80.0–100.0)
Platelets: 207 10*3/uL (ref 150–400)
RBC: 3.76 MIL/uL — ABNORMAL LOW (ref 3.87–5.11)
RDW: 16.7 % — ABNORMAL HIGH (ref 11.5–15.5)
WBC: 12.7 10*3/uL — ABNORMAL HIGH (ref 4.0–10.5)
nRBC: 0 % (ref 0.0–0.2)

## 2021-09-04 LAB — URINALYSIS, ROUTINE W REFLEX MICROSCOPIC
Bilirubin Urine: NEGATIVE
Glucose, UA: NEGATIVE mg/dL
Hgb urine dipstick: NEGATIVE
Ketones, ur: NEGATIVE mg/dL
Leukocytes,Ua: NEGATIVE
Nitrite: NEGATIVE
Protein, ur: NEGATIVE mg/dL
Specific Gravity, Urine: 1.004 — ABNORMAL LOW (ref 1.005–1.030)
pH: 7 (ref 5.0–8.0)

## 2021-09-04 LAB — RESP PANEL BY RT-PCR (FLU A&B, COVID) ARPGX2
Influenza A by PCR: NEGATIVE
Influenza B by PCR: NEGATIVE
SARS Coronavirus 2 by RT PCR: NEGATIVE

## 2021-09-04 IMAGING — DX DG CHEST 1V PORT
1 series · 1 of 1 positions shown · non-contrast
Comparison: Portable chest [DATE]

CLINICAL DATA: Shortness of breath

EXAM:
PORTABLE CHEST 1 VIEW

[chest ap]
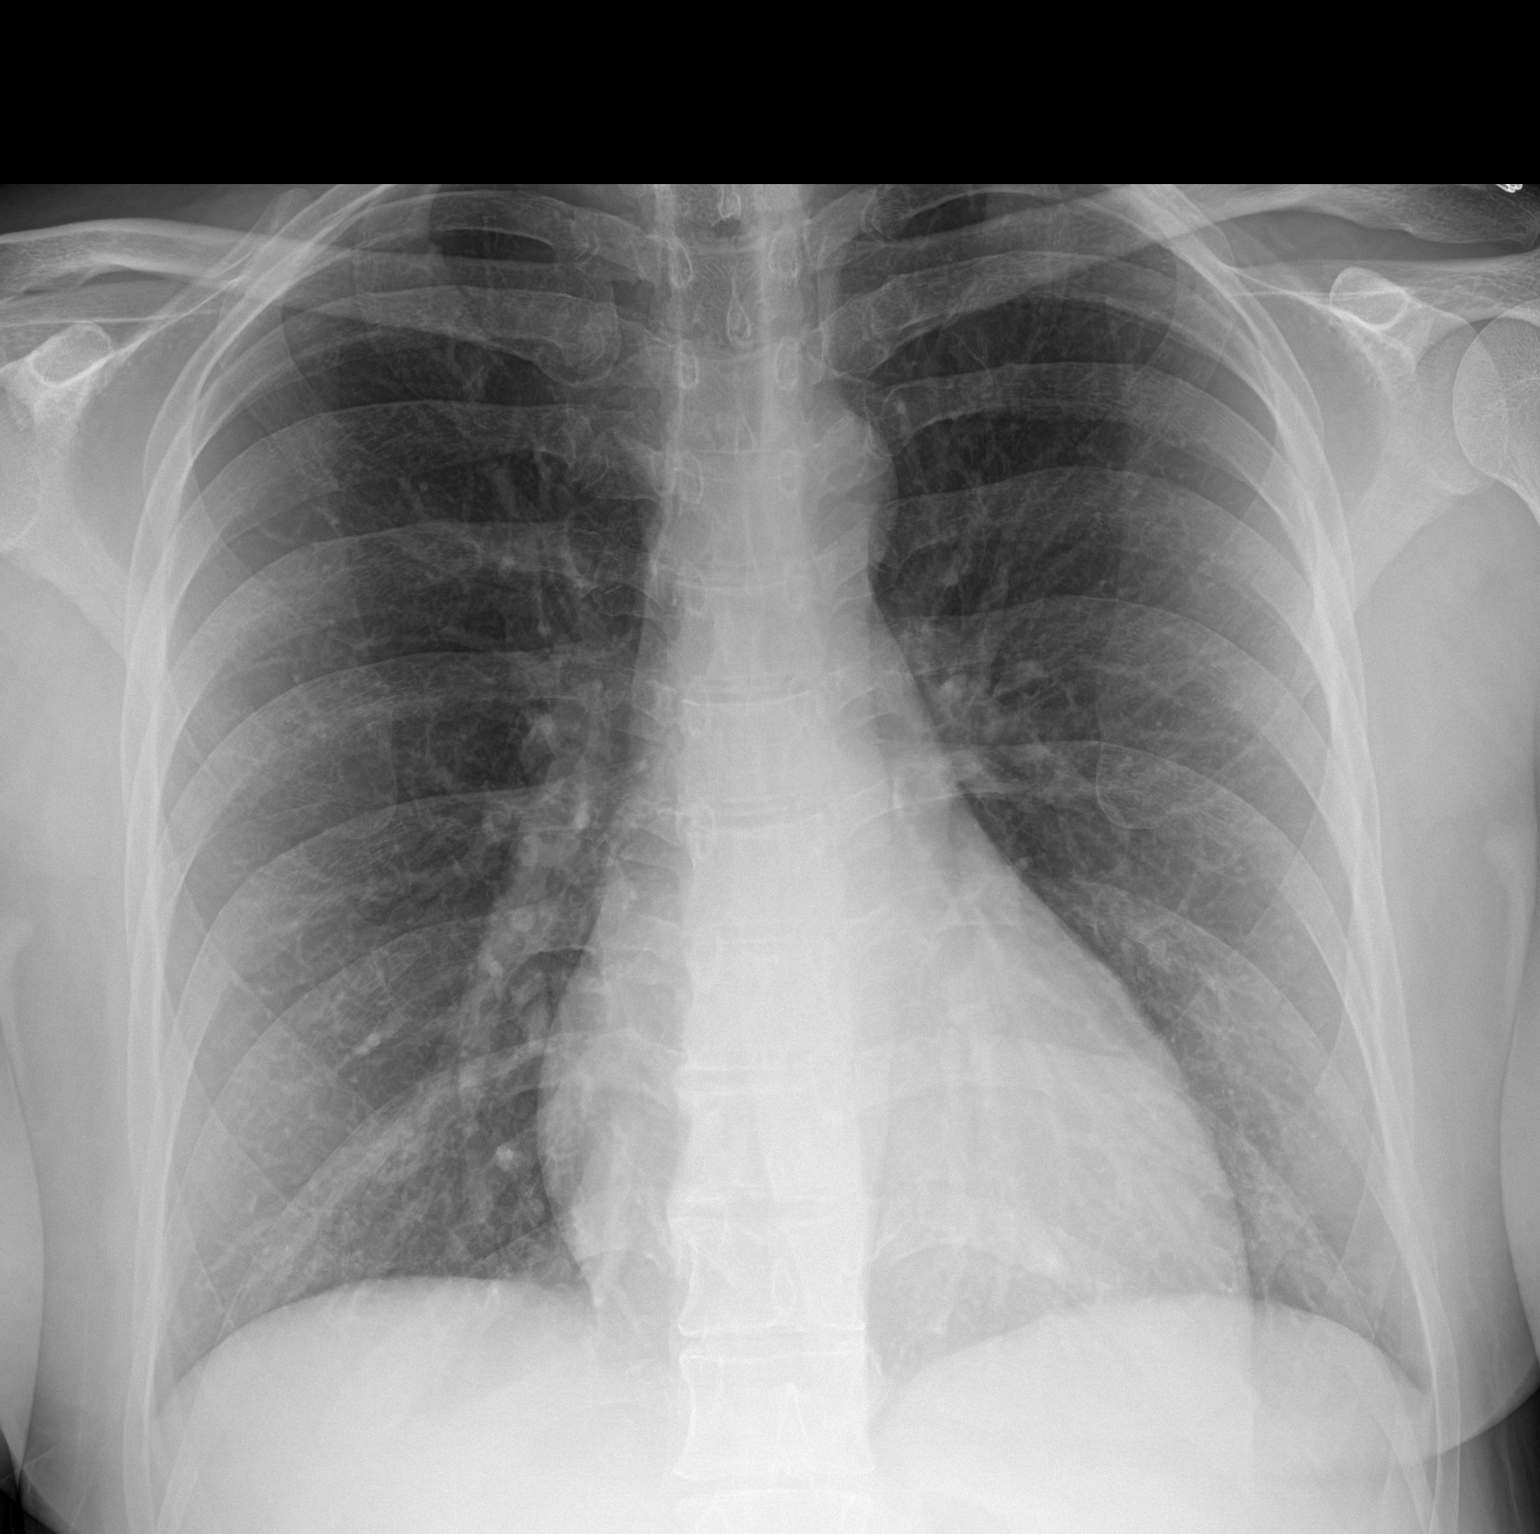

[1 of 1 positions shown; findings below may reference images not displayed]

FINDINGS: The heart size and mediastinal contours are within normal limits.
Both lungs are clear. The visualized skeletal structures are
unremarkable.
IMPRESSION: No active disease or interval changes.

## 2021-09-04 MED ORDER — LACTATED RINGERS IV SOLN: Freq: Once | INTRAVENOUS | Status: AC

## 2021-09-04 MED ORDER — ACETAMINOPHEN 500 MG PO TABS
1000.0000 mg | ORAL_TABLET | Freq: Once | ORAL | Status: AC
  Administered 2021-09-04: 1000 mg via ORAL
  Filled 2021-09-04: qty 2

## 2021-09-04 NOTE — MAU Note (Incomplete)
She is sick, started yesterday.(Cough, runny nose, low grade fever - highest 100.6, sore throat) Her heart rate has been super elevated today. (Was up to the 180's for a little bit).  Gets  really dizzy and short of breath. Had been a little elevated, they thought it was because she was anemic, she is on iron and that has resolved.  No bleeding. Reports intermittent contractions.  Baby has been moving less today.

## 2021-09-04 NOTE — Discharge Instructions (Signed)

## 2021-09-04 NOTE — MAU Provider Note (Addendum)
History     CSN: [id number]  Arrival date and time: 09/04/21 4098   Event Date/Time   First Provider Initiated Contact with Patient 09/04/21 1949      Chief Complaint  Patient presents with   Abdominal Pain   Tachycardia   Shortness of Breath   Sore Throat   Cough   Nasal Congestion   Decreased Fetal Movement   HPI Adriana Harrell 30 y.o. [redacted]w[redacted]d Comes to MAU with URI symptoms that started yesterday.  Has had fever of 100.3.  Took tylenol last at 3 pm today.  Has had some tachycardia, shortness of breath and dizziness when walking around at home.  O2 sat 100% in MAU.  HR normal at rest in MAU.  Reports heart rate of 180 when walking around at home.  She also reports decreased fetal movements.  On the fetal monitor and is noted to be having contractions and abdominal pain.  Did not notice she was having contractions until she came to MAU today.  Has had persistent headaches in this pregnancy and was diagnosed with a pseudotumor cerebri.  No headache reported today.  Scheduled for repeat C/S on Nov. 22   OB History     Gravida  4   Para  2   Term  2   Preterm      AB  1   Living  2      SAB  1   IAB      Ectopic      Multiple      Live Births  2           Past Medical History:  Diagnosis Date   Migraine     Past Surgical History:  Procedure Laterality Date   CESAREAN SECTION     x2   TONSILLECTOMY      Family History  Problem Relation Age of Onset   Cancer Mother     Social History   Tobacco Use   Smoking status: Former   Smokeless tobacco: Never  Substance Use Topics   Alcohol use: Not Currently   Drug use: Not Currently    Allergies:  Allergies  Allergen Reactions   Penicillins Hives   Tamiflu [Oseltamivir Phosphate] Hives   Tamiflu [Oseltamivir] Hives    Medications Prior to Admission  Medication Sig Dispense Refill Last Dose   acetaminophen (TYLENOL) 500 MG tablet Take 2 tablets (1,000 mg total) by mouth every 6 (six)  hours. 30 tablet 0 09/04/2021   butalbital-acetaminophen-caffeine (FIORICET) 50-325-40 MG tablet Take 1 tablet by mouth every 6 (six) hours as needed for headache. 12 tablet 5 Past Week   magnesium oxide (MAG-OX) 400 (240 Mg) MG tablet Take 2 tablets (800 mg total) by mouth daily. 30 tablet 0 09/03/2021   Prenatal Vit-Fe Fumarate-FA (PRENATAL MULTIVITAMIN) TABS tablet Take 1 tablet by mouth daily at 12 noon.   09/04/2021   acetaZOLAMIDE ER (DIAMOX) 500 MG capsule Take 3 capsules (1,500 mg total) by mouth 2 (two) times daily. (Patient not taking: No sig reported) 60 capsule 0    butorphanol (STADOL) 10 MG/ML nasal spray Place 1-2 sprays into the nose every 4 (four) hours as needed for headache. 2.5 mL 0 More than a month   metroNIDAZOLE (METROGEL) 0.75 % vaginal gel Place 1 Applicatorful vaginally at bedtime. Apply one applicatorful to vagina at bedtime for 5 days 70 g 1    potassium chloride SA (KLOR-CON) 20 MEQ tablet Take 1 tablet (20 mEq total) by  mouth daily for 2 days. 2 tablet 0    promethazine (PHENERGAN) 12.5 MG tablet Take 1 tablet (12.5 mg total) by mouth every 6 (six) hours as needed for nausea or vomiting. 30 tablet 0 More than a month   zolpidem (AMBIEN) 5 MG tablet Take 1 tablet (5 mg total) by mouth at bedtime as needed for sleep. (Patient not taking: No sig reported) 30 tablet 0     Review of Systems  Constitutional:  Positive for fever.  HENT:  Positive for congestion.   Respiratory:  Positive for cough and shortness of breath.   Cardiovascular:        Tachycardia  Gastrointestinal:  Positive for abdominal pain. Negative for nausea and vomiting.       Decreased fetal movement. Uterine contractions on arrival at MAU  Genitourinary:  Negative for dysuria, vaginal bleeding and vaginal discharge.   Physical Exam   Blood pressure 124/69, pulse 97, temperature 98.7 F (37.1 C), temperature source Oral, resp. rate 20, height 5\' 2"  (1.575 m), weight 74.1 kg, last menstrual period  12/11/2020, SpO2 100 %.  Physical Exam Vitals and nursing note reviewed.  Constitutional:      Appearance: She is well-developed.  HENT:     Head: Normocephalic.     Nose: Congestion present.     Mouth/Throat:     Comments: Sore throat Cardiovascular:     Rate and Rhythm: Normal rate and regular rhythm.     Pulses: Normal pulses.     Heart sounds: Normal heart sounds. No murmur heard. Pulmonary:     Effort: Pulmonary effort is normal.     Breath sounds: Normal breath sounds.     Comments: Breathing some faster while provider in room Abdominal:     Palpations: Abdomen is soft.     Tenderness: There is no abdominal tenderness. There is no guarding or rebound.     Comments: Reports decreased fetal movement FHT baseline 130 with moderate variability.  Has greater than 15x15 accels noted.  Baby has lots of accels.  No decelerations. Contractions 3-4 minutes.  Genitourinary:    Comments: Cervix check by RN.   Cervix closed and thick.  Presenting part is ballotable. Musculoskeletal:        General: Normal range of motion.     Cervical back: Neck supple.  Skin:    General: Skin is warm and dry.  Neurological:     Mental Status: She is alert and oriented to person, place, and time.  Psychiatric:        Mood and Affect: Mood normal.        Behavior: Behavior normal.        Thought Content: Thought content normal.    MAU Course  Procedures Given IVF for hydration to see if contractions will decrease. Covid and flu screen pending. Patient O2 sat 100% and heart rate in 90's  MDM Will give IVF to see if contractions can be decreased. Will check for covid and flu. Patient has been taking Tylenol but no other "URI" medications. Will plan to have pulse Ox on and walk in room to check HR and O2 Sat while walking.  Care assumed by 02/08/2021, NP Estanislado Spire 09/04/2021, 9:04 PM   Patient had normal EKG, negative chest x-ray, and normal labs.  She tested negative for COVID  and flu.  Symptoms improved after Tylenol and IV fluids, pt appears more comfortable.  Oxygen saturation remained at 100% while ambulating, and maternal heart rate did  not rise above 116 while ambulating. Fetal tracing is reactive although had some episodes of what appeared like prolonged accelerations then returned to a baseline of 120s.  Reviewed tracing fully with Dr. Debroah Loop. Patient states she is ready to go home.  Discussed symptomatic treatment at home.  Assessment and Plan   1. Viral illness   2. Shortness of breath   3. [redacted] weeks gestation of pregnancy    -given list of OTC meds safe in pregnancy - reviewed symptomatic treatment -reviewed reasons to return to MAU or ED -keep appointment with [facility] next Thursday  Judeth Horn, NP

## 2021-09-18 ENCOUNTER — Encounter (HOSPITAL_COMMUNITY): Payer: Self-pay | Admitting: Obstetrics and Gynecology

## 2021-09-18 NOTE — Anesthesia Preprocedure Evaluation (Deleted)
Anesthesia Evaluation    Reviewed: Allergy & Precautions, H&P , Patient's Chart, lab work & pertinent test results  Airway        Dental   Pulmonary neg pulmonary ROS, former smoker,           Cardiovascular Exercise Tolerance: Good negative cardio ROS       Neuro/Psych  Headaches, Pseudotumor cerebri with benign intracranial hypertension Chronic HA's negative neurological ROS  negative psych ROS   GI/Hepatic negative GI ROS, Neg liver ROS, GERD  ,  Endo/Other  negative endocrine ROS  Renal/GU negative Renal ROS  negative genitourinary   Musculoskeletal negative musculoskeletal ROS (+)   Abdominal   Peds  Hematology negative hematology ROS (+) anemia ,   Anesthesia Other Findings Pseudotumor cerebri Supervision of normal IUP (intrauterine pregnancy) in multigravida, unspecified trimester Pseudotumor Pregnancy Intracranial hypertension, 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) Chronic migraine 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 status migrainosus, not intractable Persistent headaches    Reproductive/Obstetrics negative OB ROS (+) Pregnancy Previous C/Sections x 2                            Anesthesia Physical Anesthesia Plan  ASA: 3  Anesthesia Plan: Spinal   Post-op Pain Management:    Induction:   PONV Risk Score and Plan: 4 or greater and Treatment may vary due to age or medical condition  Airway Management Planned: Natural Airway and Nasal Cannula  Additional Equipment: None  Intra-op Plan:   Post-operative Plan:   Informed Consent: I have reviewed the patients History and Physical, chart, labs and discussed the procedure including the risks, benefits and alternatives for the proposed anesthesia with the patient or authorized representative who has indicated his/her understanding and acceptance.       Plan Discussed with:  Anesthesiologist and CRNA  Anesthesia Plan Comments: (  )       Anesthesia Quick Evaluation

## 2021-09-21 ENCOUNTER — Other Ambulatory Visit: Payer: Self-pay

## 2021-09-21 ENCOUNTER — Inpatient Hospital Stay (HOSPITAL_COMMUNITY)
Admission: AD | Admit: 2021-09-21 | Discharge: 2021-09-21 | Disposition: A | Discharge status: Still In Hospital | Payer: BC Managed Care – PPO | Attending: Obstetrics and Gynecology | Admitting: Obstetrics and Gynecology

## 2021-09-21 ENCOUNTER — Other Ambulatory Visit: Payer: Self-pay | Admitting: Obstetrics and Gynecology

## 2021-09-21 ENCOUNTER — Encounter (HOSPITAL_COMMUNITY)
Admission: RE | Admit: 2021-09-21 | Discharge: 2021-09-21 | Disposition: A | Discharge status: Home / Self Care | Payer: BC Managed Care – PPO | Source: Ambulatory Visit | Attending: Obstetrics and Gynecology | Admitting: Obstetrics and Gynecology

## 2021-09-21 DIAGNOSIS — Z01818 Encounter for other preprocedural examination: Secondary | ICD-10-CM

## 2021-09-21 LAB — SARS CORONAVIRUS 2 (TAT 6-24 HRS): SARS Coronavirus 2: NEGATIVE

## 2021-09-21 NOTE — Patient Instructions (Signed)
Adriana Harrell  09/21/2021   Your procedure is scheduled on:  09/22/2021  Arrive at 0530 at Entrance C on [employer] at [facility]  and [address]. You are invited to use the FREE valet parking or use the Visitor's parking deck.  Pick up the phone at the desk and dial [phone].  Call this number if you have problems the morning of surgery: [phone]  Remember:   Do not eat food:(After Midnight) Desps de medianoche.  Do not drink clear liquids: (After Midnight) Desps de medianoche.  Take these medicines the morning of surgery with A SIP OF WATER:  none   Do not wear jewelry, make-up or nail polish.  Do not wear lotions, powders, or perfumes. Do not wear deodorant.  Do not shave 48 hours prior to surgery.  Do not bring valuables to the hospital.  [facility] is not   responsible for any belongings or valuables brought to the hospital.  Contacts, dentures or bridgework may not be worn into surgery.  Leave suitcase in the car. After surgery it may be brought to your room.  For patients admitted to the hospital, checkout time is 11:00 AM the day of              discharge.      Please read over the following fact sheets that you were given:     Preparing for Surgery

## 2021-09-22 ENCOUNTER — Encounter (HOSPITAL_COMMUNITY)
Admission: AD | Disposition: A | Discharge status: Home / Self Care | Payer: Self-pay | Source: Home / Self Care | Attending: Obstetrics and Gynecology

## 2021-09-22 ENCOUNTER — Encounter (HOSPITAL_COMMUNITY): Payer: Self-pay | Admitting: Obstetrics and Gynecology

## 2021-09-22 ENCOUNTER — Encounter (HOSPITAL_COMMUNITY)
Admission: RE | Disposition: A | Discharge status: Home / Self Care | Payer: Self-pay | Source: Home / Self Care | Attending: Obstetrics and Gynecology

## 2021-09-22 ENCOUNTER — Inpatient Hospital Stay (HOSPITAL_COMMUNITY): Payer: BC Managed Care – PPO | Admitting: Certified Registered Nurse Anesthetist

## 2021-09-22 ENCOUNTER — Inpatient Hospital Stay (HOSPITAL_COMMUNITY)
Admission: RE | Admit: 2021-09-22 | Discharge: 2021-09-24 | DRG: 784 | Disposition: A | Discharge status: Home / Self Care | Payer: BC Managed Care – PPO | Attending: Obstetrics and Gynecology | Admitting: Obstetrics and Gynecology

## 2021-09-22 ENCOUNTER — Other Ambulatory Visit: Payer: Self-pay

## 2021-09-22 ENCOUNTER — Inpatient Hospital Stay (HOSPITAL_COMMUNITY): Admit: 2021-09-22 | Payer: BC Managed Care – PPO | Admitting: Obstetrics and Gynecology

## 2021-09-22 DIAGNOSIS — Z3A39 39 weeks gestation of pregnancy: Secondary | ICD-10-CM

## 2021-09-22 DIAGNOSIS — Z98891 History of uterine scar from previous surgery: Secondary | ICD-10-CM

## 2021-09-22 DIAGNOSIS — Z302 Encounter for sterilization: Secondary | ICD-10-CM

## 2021-09-22 DIAGNOSIS — O99354 Diseases of the nervous system complicating childbirth: Secondary | ICD-10-CM | POA: Diagnosis present

## 2021-09-22 DIAGNOSIS — D62 Acute posthemorrhagic anemia: Secondary | ICD-10-CM | POA: Diagnosis not present

## 2021-09-22 DIAGNOSIS — G932 Benign intracranial hypertension: Secondary | ICD-10-CM | POA: Diagnosis present

## 2021-09-22 DIAGNOSIS — O34219 Maternal care for unspecified type scar from previous cesarean delivery: Principal | ICD-10-CM | POA: Diagnosis present

## 2021-09-22 DIAGNOSIS — O9081 Anemia of the puerperium: Secondary | ICD-10-CM | POA: Diagnosis not present

## 2021-09-22 DIAGNOSIS — Z87891 Personal history of nicotine dependence: Secondary | ICD-10-CM

## 2021-09-22 LAB — CBC
HCT: 31.9 % — ABNORMAL LOW (ref 36.0–46.0)
Hemoglobin: 10.3 g/dL — ABNORMAL LOW (ref 12.0–15.0)
MCH: 26.4 pg (ref 26.0–34.0)
MCHC: 32.3 g/dL (ref 30.0–36.0)
MCV: 81.8 fL (ref 80.0–100.0)
Platelets: 272 10*3/uL (ref 150–400)
RBC: 3.9 MIL/uL (ref 3.87–5.11)
RDW: 15.4 % (ref 11.5–15.5)
WBC: 15.3 10*3/uL — ABNORMAL HIGH (ref 4.0–10.5)
nRBC: 0 % (ref 0.0–0.2)

## 2021-09-22 LAB — TYPE AND SCREEN
ABO/RH(D): O NEG
Antibody Screen: POSITIVE

## 2021-09-22 LAB — RPR: RPR Ser Ql: NONREACTIVE

## 2021-09-22 SURGERY — Surgical Case
Anesthesia: Regional | Laterality: Bilateral

## 2021-09-22 SURGERY — Surgical Case
Anesthesia: Spinal

## 2021-09-22 MED ORDER — PHENYLEPHRINE HCL-NACL 20-0.9 MG/250ML-% IV SOLN
INTRAVENOUS | Status: AC
  Filled 2021-09-22: qty 250

## 2021-09-22 MED ORDER — SIMETHICONE 80 MG PO CHEW
80.0000 mg | CHEWABLE_TABLET | Freq: Three times a day (TID) | ORAL | Status: DC
  Administered 2021-09-22 – 2021-09-24 (×5): 80 mg via ORAL
  Filled 2021-09-22 (×5): qty 1

## 2021-09-22 MED ORDER — MORPHINE SULFATE (PF) 0.5 MG/ML IJ SOLN: INTRAMUSCULAR | Status: DC | PRN

## 2021-09-22 MED ORDER — KETOROLAC TROMETHAMINE 30 MG/ML IJ SOLN: 30.0000 mg | Freq: Four times a day (QID) | INTRAMUSCULAR | Status: DC | PRN

## 2021-09-22 MED ORDER — ACETAMINOPHEN 325 MG PO TABS
325.0000 mg | ORAL_TABLET | ORAL | Status: DC | PRN
Start: 2021-09-22 — End: 2021-09-22

## 2021-09-22 MED ORDER — ONDANSETRON HCL 4 MG/2ML IJ SOLN: 4.0000 mg | Freq: Three times a day (TID) | INTRAMUSCULAR | Status: DC | PRN

## 2021-09-22 MED ORDER — GENTAMICIN SULFATE 40 MG/ML IJ SOLN
5.0000 mg/kg | INTRAVENOUS | Status: AC
  Administered 2021-09-22: 370 mg via INTRAVENOUS
  Filled 2021-09-22: qty 9.25

## 2021-09-22 MED ORDER — NALBUPHINE HCL 10 MG/ML IJ SOLN: 5.0000 mg | INTRAMUSCULAR | Status: DC | PRN

## 2021-09-22 MED ORDER — KETOROLAC TROMETHAMINE 30 MG/ML IJ SOLN
30.0000 mg | Freq: Four times a day (QID) | INTRAMUSCULAR | Status: AC | PRN
  Administered 2021-09-22 – 2021-09-23 (×4): 30 mg via INTRAVENOUS
  Filled 2021-09-22 (×3): qty 1

## 2021-09-22 MED ORDER — ONDANSETRON HCL 4 MG/2ML IJ SOLN
4.0000 mg | Freq: Three times a day (TID) | INTRAMUSCULAR | Status: DC | PRN
  Administered 2021-09-22: 4 mg via INTRAVENOUS

## 2021-09-22 MED ORDER — WITCH HAZEL-GLYCERIN EX PADS: 1.0000 "application " | MEDICATED_PAD | CUTANEOUS | Status: DC | PRN

## 2021-09-22 MED ORDER — SCOPOLAMINE 1 MG/3DAYS TD PT72: 1.0000 | MEDICATED_PATCH | Freq: Once | TRANSDERMAL | Status: DC

## 2021-09-22 MED ORDER — NALOXONE HCL 4 MG/10ML IJ SOLN: 1.0000 ug/kg/h | INTRAVENOUS | Status: DC | PRN

## 2021-09-22 MED ORDER — DIPHENHYDRAMINE HCL 50 MG/ML IJ SOLN
INTRAMUSCULAR | Status: AC
  Filled 2021-09-22: qty 1

## 2021-09-22 MED ORDER — DIBUCAINE (PERIANAL) 1 % EX OINT: 1.0000 "application " | TOPICAL_OINTMENT | CUTANEOUS | Status: DC | PRN

## 2021-09-22 MED ORDER — DIPHENHYDRAMINE HCL 25 MG PO CAPS: 25.0000 mg | ORAL_CAPSULE | Freq: Four times a day (QID) | ORAL | Status: DC | PRN

## 2021-09-22 MED ORDER — LACTATED RINGERS IV SOLN: INTRAVENOUS | Status: DC

## 2021-09-22 MED ORDER — NALBUPHINE HCL 10 MG/ML IJ SOLN: 5.0000 mg | Freq: Once | INTRAMUSCULAR | Status: AC | PRN

## 2021-09-22 MED ORDER — NALOXONE HCL 4 MG/10ML IJ SOLN
1.0000 ug/kg/h | INTRAVENOUS | Status: DC | PRN
  Filled 2021-09-22: qty 5

## 2021-09-22 MED ORDER — POVIDONE-IODINE 10 % EX SWAB
2.0000 "application " | Freq: Once | CUTANEOUS | Status: AC
  Administered 2021-09-22: 2 via TOPICAL

## 2021-09-22 MED ORDER — CLINDAMYCIN PHOSPHATE 900 MG/50ML IV SOLN
INTRAVENOUS | Status: AC
  Filled 2021-09-22: qty 50

## 2021-09-22 MED ORDER — DEXAMETHASONE SODIUM PHOSPHATE 4 MG/ML IJ SOLN
INTRAMUSCULAR | Status: AC
  Filled 2021-09-22: qty 1

## 2021-09-22 MED ORDER — SODIUM CHLORIDE 0.9 % IR SOLN
Status: DC | PRN
  Administered 2021-09-22: 1

## 2021-09-22 MED ORDER — KETOROLAC TROMETHAMINE 30 MG/ML IJ SOLN: 30.0000 mg | Freq: Four times a day (QID) | INTRAMUSCULAR | Status: AC | PRN

## 2021-09-22 MED ORDER — OXYCODONE HCL 5 MG PO TABS: 5.0000 mg | ORAL_TABLET | Freq: Once | ORAL | Status: DC | PRN

## 2021-09-22 MED ORDER — NALBUPHINE HCL 10 MG/ML IJ SOLN: 5.0000 mg | Freq: Once | INTRAMUSCULAR | Status: DC | PRN

## 2021-09-22 MED ORDER — SODIUM CHLORIDE 0.9% FLUSH: 3.0000 mL | INTRAVENOUS | Status: DC | PRN

## 2021-09-22 MED ORDER — MEPERIDINE HCL 25 MG/ML IJ SOLN: 6.2500 mg | INTRAMUSCULAR | Status: DC | PRN

## 2021-09-22 MED ORDER — COCONUT OIL OIL: 1.0000 "application " | TOPICAL_OIL | Status: DC | PRN

## 2021-09-22 MED ORDER — BUPIVACAINE IN DEXTROSE 0.75-8.25 % IT SOLN
INTRATHECAL | Status: DC | PRN
  Administered 2021-09-22: 1.55 mL via INTRATHECAL

## 2021-09-22 MED ORDER — ACETAMINOPHEN 10 MG/ML IV SOLN
INTRAVENOUS | Status: DC | PRN
  Administered 2021-09-22: 1000 mg via INTRAVENOUS

## 2021-09-22 MED ORDER — DEXAMETHASONE SODIUM PHOSPHATE 10 MG/ML IJ SOLN
INTRAMUSCULAR | Status: AC
  Filled 2021-09-22: qty 1

## 2021-09-22 MED ORDER — DEXAMETHASONE SODIUM PHOSPHATE 10 MG/ML IJ SOLN
INTRAMUSCULAR | Status: DC | PRN
  Administered 2021-09-22: 10 mg via INTRAVENOUS

## 2021-09-22 MED ORDER — NALOXONE HCL 0.4 MG/ML IJ SOLN: 0.4000 mg | INTRAMUSCULAR | Status: DC | PRN

## 2021-09-22 MED ORDER — NALBUPHINE HCL 10 MG/ML IJ SOLN
5.0000 mg | Freq: Once | INTRAMUSCULAR | Status: AC | PRN
  Administered 2021-09-22: 5 mg via INTRAVENOUS

## 2021-09-22 MED ORDER — STERILE WATER FOR IRRIGATION IR SOLN
Status: DC | PRN
  Administered 2021-09-22: 1000 mL

## 2021-09-22 MED ORDER — DIPHENHYDRAMINE HCL 25 MG PO CAPS: 25.0000 mg | ORAL_CAPSULE | ORAL | Status: DC | PRN

## 2021-09-22 MED ORDER — OXYCODONE-ACETAMINOPHEN 5-325 MG PO TABS
1.0000 | ORAL_TABLET | ORAL | Status: DC | PRN
  Administered 2021-09-23: 1 via ORAL
  Filled 2021-09-22: qty 1

## 2021-09-22 MED ORDER — MORPHINE SULFATE (PF) 0.5 MG/ML IJ SOLN
INTRAMUSCULAR | Status: DC | PRN
  Administered 2021-09-22: 150 ug via INTRATHECAL

## 2021-09-22 MED ORDER — MENTHOL 3 MG MT LOZG: 1.0000 | LOZENGE | OROMUCOSAL | Status: DC | PRN

## 2021-09-22 MED ORDER — MORPHINE SULFATE (PF) 0.5 MG/ML IJ SOLN
INTRAMUSCULAR | Status: AC
  Filled 2021-09-22: qty 10

## 2021-09-22 MED ORDER — ONDANSETRON HCL 4 MG/2ML IJ SOLN
INTRAMUSCULAR | Status: AC
  Filled 2021-09-22: qty 2

## 2021-09-22 MED ORDER — FENTANYL CITRATE (PF) 100 MCG/2ML IJ SOLN
INTRAMUSCULAR | Status: DC | PRN
  Administered 2021-09-22: 15 ug via INTRATHECAL

## 2021-09-22 MED ORDER — ACETAMINOPHEN 160 MG/5ML PO SOLN: 325.0000 mg | ORAL | Status: DC | PRN

## 2021-09-22 MED ORDER — DIPHENHYDRAMINE HCL 50 MG/ML IJ SOLN: 12.5000 mg | INTRAMUSCULAR | Status: DC | PRN

## 2021-09-22 MED ORDER — KETOROLAC TROMETHAMINE 30 MG/ML IJ SOLN
INTRAMUSCULAR | Status: AC
  Filled 2021-09-22: qty 1

## 2021-09-22 MED ORDER — PHENYLEPHRINE HCL-NACL 20-0.9 MG/250ML-% IV SOLN
INTRAVENOUS | Status: DC | PRN
  Administered 2021-09-22: 60 ug/min via INTRAVENOUS

## 2021-09-22 MED ORDER — BUTALBITAL-APAP-CAFFEINE 50-325-40 MG PO TABS: 1.0000 | ORAL_TABLET | Freq: Four times a day (QID) | ORAL | Status: DC | PRN

## 2021-09-22 MED ORDER — IBUPROFEN 600 MG PO TABS
600.0000 mg | ORAL_TABLET | Freq: Four times a day (QID) | ORAL | Status: DC
  Administered 2021-09-23 – 2021-09-24 (×4): 600 mg via ORAL
  Filled 2021-09-22 (×4): qty 1

## 2021-09-22 MED ORDER — MEASLES, MUMPS & RUBELLA VAC IJ SOLR: 0.5000 mL | Freq: Once | INTRAMUSCULAR | Status: DC

## 2021-09-22 MED ORDER — OXYCODONE HCL 5 MG PO TABS
5.0000 mg | ORAL_TABLET | Freq: Once | ORAL | Status: AC
  Administered 2021-09-22: 5 mg via ORAL
  Filled 2021-09-22: qty 1

## 2021-09-22 MED ORDER — CLINDAMYCIN PHOSPHATE 900 MG/50ML IV SOLN
900.0000 mg | INTRAVENOUS | Status: AC
  Administered 2021-09-22: 900 mg via INTRAVENOUS

## 2021-09-22 MED ORDER — ACETAMINOPHEN 325 MG PO TABS
650.0000 mg | ORAL_TABLET | ORAL | Status: DC | PRN
  Administered 2021-09-22 (×2): 650 mg via ORAL
  Filled 2021-09-22 (×2): qty 2

## 2021-09-22 MED ORDER — OXYTOCIN-SODIUM CHLORIDE 30-0.9 UT/500ML-% IV SOLN
INTRAVENOUS | Status: DC | PRN
  Administered 2021-09-22: 30 [IU] via INTRAVENOUS

## 2021-09-22 MED ORDER — ZOLPIDEM TARTRATE 5 MG PO TABS: 5.0000 mg | ORAL_TABLET | Freq: Every evening | ORAL | Status: DC | PRN

## 2021-09-22 MED ORDER — OXYTOCIN-SODIUM CHLORIDE 30-0.9 UT/500ML-% IV SOLN: 2.5000 [IU]/h | INTRAVENOUS | Status: AC

## 2021-09-22 MED ORDER — NALBUPHINE HCL 10 MG/ML IJ SOLN
5.0000 mg | INTRAMUSCULAR | Status: DC | PRN
  Administered 2021-09-22 – 2021-09-23 (×2): 5 mg via INTRAVENOUS
  Filled 2021-09-22 (×3): qty 1

## 2021-09-22 MED ORDER — OXYCODONE-ACETAMINOPHEN 5-325 MG PO TABS
2.0000 | ORAL_TABLET | ORAL | Status: DC | PRN
  Administered 2021-09-23 – 2021-09-24 (×6): 2 via ORAL
  Filled 2021-09-22 (×6): qty 2

## 2021-09-22 MED ORDER — TETANUS-DIPHTH-ACELL PERTUSSIS 5-2.5-18.5 LF-MCG/0.5 IM SUSY: 0.5000 mL | PREFILLED_SYRINGE | Freq: Once | INTRAMUSCULAR | Status: DC

## 2021-09-22 MED ORDER — FENTANYL CITRATE (PF) 100 MCG/2ML IJ SOLN
INTRAMUSCULAR | Status: AC
  Filled 2021-09-22: qty 2

## 2021-09-22 MED ORDER — PRENATAL MULTIVITAMIN CH
1.0000 | ORAL_TABLET | Freq: Every day | ORAL | Status: DC
  Administered 2021-09-23: 1 via ORAL
  Filled 2021-09-22: qty 1

## 2021-09-22 MED ORDER — FENTANYL CITRATE (PF) 100 MCG/2ML IJ SOLN: 25.0000 ug | INTRAMUSCULAR | Status: DC | PRN

## 2021-09-22 MED ORDER — DIPHENHYDRAMINE HCL 50 MG/ML IJ SOLN
12.5000 mg | INTRAMUSCULAR | Status: DC | PRN
  Administered 2021-09-22 (×2): 12.5 mg via INTRAVENOUS

## 2021-09-22 MED ORDER — SIMETHICONE 80 MG PO CHEW: 80.0000 mg | CHEWABLE_TABLET | ORAL | Status: DC | PRN

## 2021-09-22 MED ORDER — OXYCODONE HCL 5 MG/5ML PO SOLN: 5.0000 mg | Freq: Once | ORAL | Status: DC | PRN

## 2021-09-22 MED ORDER — SENNOSIDES-DOCUSATE SODIUM 8.6-50 MG PO TABS
2.0000 | ORAL_TABLET | ORAL | Status: DC
  Administered 2021-09-23: 2 via ORAL
  Filled 2021-09-22: qty 2

## 2021-09-22 MED ORDER — ONDANSETRON HCL 4 MG/2ML IJ SOLN: 4.0000 mg | Freq: Once | INTRAMUSCULAR | Status: DC | PRN

## 2021-09-22 SURGICAL SUPPLY — 28 items
CLAMP CORD UMBIL (MISCELLANEOUS) IMPLANT
CLIP FILSHIE TUBAL LIGA STRL (Clip) ×4 IMPLANT
CLOTH BEACON ORANGE TIMEOUT ST (SAFETY) ×2 IMPLANT
DRSG OPSITE POSTOP 4X10 (GAUZE/BANDAGES/DRESSINGS) ×2 IMPLANT
ELECT REM PT RETURN 9FT ADLT (ELECTROSURGICAL) ×2
ELECTRODE REM PT RTRN 9FT ADLT (ELECTROSURGICAL) ×1 IMPLANT
EXTRACTOR VACUUM M CUP 4 TUBE (SUCTIONS) IMPLANT
GLOVE BIOGEL PI IND STRL 7.0 (GLOVE) ×1 IMPLANT
GLOVE BIOGEL PI INDICATOR 7.0 (GLOVE) ×1
GLOVE SURG ORTHO 8.0 STRL STRW (GLOVE) ×2 IMPLANT
GOWN STRL REUS W/TWL LRG LVL3 (GOWN DISPOSABLE) ×4 IMPLANT
HEMOSTAT ARISTA ABSORB 3G PWDR (HEMOSTASIS) ×2 IMPLANT
KIT ABG SYR 3ML LUER SLIP (SYRINGE) ×2 IMPLANT
NEEDLE HYPO 25X5/8 SAFETYGLIDE (NEEDLE) ×2 IMPLANT
NS IRRIG 1000ML POUR BTL (IV SOLUTION) ×2 IMPLANT
PACK C SECTION WH (CUSTOM PROCEDURE TRAY) ×2 IMPLANT
PAD OB MATERNITY 4.3X12.25 (PERSONAL CARE ITEMS) ×2 IMPLANT
PENCIL SMOKE EVAC W/HOLSTER (ELECTROSURGICAL) ×2 IMPLANT
SUT MNCRL 0 VIOLET CTX 36 (SUTURE) ×4 IMPLANT
SUT MON AB 4-0 PS1 27 (SUTURE) ×2 IMPLANT
SUT MONOCRYL 0 CTX 36 (SUTURE) ×4
SUT PDS AB 1 CT  36 (SUTURE)
SUT PDS AB 1 CT 36 (SUTURE) IMPLANT
SUT VIC AB 1 CTX 36 (SUTURE) ×2
SUT VIC AB 1 CTX36XBRD ANBCTRL (SUTURE) ×1 IMPLANT
TOWEL OR 17X24 6PK STRL BLUE (TOWEL DISPOSABLE) ×2 IMPLANT
TRAY FOLEY W/BAG SLVR 14FR LF (SET/KITS/TRAYS/PACK) ×2 IMPLANT
WATER STERILE IRR 1000ML POUR (IV SOLUTION) ×2 IMPLANT

## 2021-09-22 NOTE — Anesthesia Procedure Notes (Signed)
Spinal  Patient location during procedure: OR Start time: 09/22/2021 7:30 AM End time: 09/22/2021 7:40 AM Reason for block: surgical anesthesia Staffing Anesthesiologist: Bethena Midget, MD Preanesthetic Checklist Completed: patient identified, IV checked, site marked, risks and benefits discussed, surgical consent, monitors and equipment checked, pre-op evaluation and timeout performed Spinal Block Patient position: sitting Prep: DuraPrep Patient monitoring: heart rate, cardiac monitor, continuous pulse ox and blood pressure Approach: midline Location: L3-4 Injection technique: single-shot Needle Needle type: Sprotte  Needle gauge: 24 G Needle length: 9 cm Assessment Sensory level: T4 Events: CSF return

## 2021-09-22 NOTE — Op Note (Signed)
Cesarean Section Procedure Note  Pre-operative Diagnosis: IUP at 39 weeks, Previous c/s desires repeat, desires sterilization  Post-operative Diagnosis: same  Surgeon: Turner Daniels   Assistants: none  Anesthesia: spinal  Procedure:  Low Segment Transverse cesarean section and BTL with filshie clips  Procedure Details  The patient was seen in the Holding Room. The risks, benefits, complications, treatment options, and expected outcomes were discussed with the patient.  The patient concurred with the proposed plan, giving informed consent.  The site of surgery properly noted/marked.. A Time Out was held and the above information confirmed.  After induction of anesthesia, the patient was draped and prepped in the usual sterile manner. A Pfannenstiel incision was made and carried down through the subcutaneous tissue to the fascia. Fascial incision was made and extended transversely. The fascia was separated from the underlying rectus tissue superiorly and inferiorly. The peritoneum was identified and entered. Peritoneal incision was extended longitudinally. The utero-vesical peritoneal reflection was incised transversely and the bladder flap was bluntly freed from the lower uterine segment. A low transverse uterine incision was made. Delivered from vertex presentation was a baby with Apgar scores of 9 at one minute and 9 at five minutes. After the umbilical cord was clamped and cut cord blood was obtained for evaluation. The placenta was removed intact and appeared normal. The uterine outline, tubes and ovaries appeared normal. The uterine incision was closed with running locked sutures of 0 monocryl and imbricated with 0 monocryl. Hemostasis was observed. The fallopian tubes were identified by their fimbriated ends and filshie clips were placed across the tubes at the midportion of each tube.  Good placement was noted bilaterally.  Lavage was carried out until clear. The peritoneum was then closed with  0 monocryl and rectus muscles plicated in the midline.  After hemostasis was assured, the fascia was then reapproximated with running sutures of 0 Vicryl. Irrigation was applied and after adequate hemostasis was assured, the skin was reapproximated with subcutaneous sutures using 4-0 monocryl.  Instrument, sponge, and needle counts were correct prior the abdominal closure and at the conclusion of the case. The patient received 2 grams cefotetan preoperatively.  Findings: Viable female  Estimated Blood Loss:  550cc         Specimens: Placenta was sent to labor and delivery         Complications:  None

## 2021-09-22 NOTE — H&P (Signed)
Adriana Harrell is a 30 y.o. female presenting for repeat c/s and BTL.  Pregnancy complicated by admission for headache and pseudotumor cerebri managed by neurology and prn meds.  GBS neg.. OB History     Gravida  4   Para  2   Term  2   Preterm      AB  1   Living  2      SAB  1   IAB      Ectopic      Multiple      Live Births  2          Past Medical History:  Diagnosis Date   Migraine    Past Surgical History:  Procedure Laterality Date   CESAREAN SECTION     x2   TONSILLECTOMY     Family History: family history includes Cancer in her mother. Social History:  reports that she has quit smoking. She has never used smokeless tobacco. She reports that she does not currently use alcohol. She reports that she does not currently use drugs.     Maternal Diabetes: No Genetic Screening: Normal Maternal Ultrasounds/Referrals: Normal Fetal Ultrasounds or other Referrals:  None Maternal Substance Abuse:  No Significant Maternal Medications:  None Significant Maternal Lab Results:  Group B Strep negative Other Comments:  None  Review of Systems History   Blood pressure 117/69, pulse 98, temperature 97.8 F (36.6 C), temperature source Oral, resp. rate 18, height 5\' 2"  (1.575 m), weight 74.4 kg, last menstrual period 12/11/2020, SpO2 100 %. Exam Physical Exam  Vitals and nursing note reviewed. Exam conducted with a chaperone present.  Constitutional:      Appearance: Normal appearance.  HENT:     Head: Normocephalic.  Eyes:     Pupils: Pupils are equal, round, and reactive to light.  Cardiovascular:     Rate and Rhythm: Normal rate and regular rhythm.     Pulses: Normal pulses.  Abdominal:     General: Abdomen is Gravid, nontender Neurological:     Mental Status: She is alert.  Prenatal labs: ABO, Rh: --/--/PENDING (11/22 05-10-2003) Antibody: PENDING (11/22 0547) Rubella: Immune (04/19 0000) RPR:    HBsAg: Negative (04/19 0000)  HIV: Non-reactive  (09/01 0000)  GBS:     Assessment/Plan: IUP at 39 weeks Previous c/s desires repeat Desires sterilization. Risks and benefits of C/S were discussed.  All questions were answered and informed consent was obtained.  Plan to proceed with low segment transverse Cesarean Section. Multiparity and desires sterility.  Discussed risks and benefits of sterilization including but not limited to risk of tubal failure quoted as 03/999.  She gives her informed consent.  This patient has been seen and examined.   All of her questions were answered.  Labs and vital signs reviewed.  Informed consent has been obtained.  The History and Physical is current.    04/999 09/22/2021, 7:20 AM

## 2021-09-22 NOTE — Anesthesia Preprocedure Evaluation (Signed)
Anesthesia Evaluation  Patient identified by MRN, date of birth, ID band Patient awake    Reviewed: Allergy & Precautions, H&P , NPO status , Patient's Chart, lab work & pertinent test results, reviewed documented beta blocker date and time   Airway Mallampati: II  TM Distance: >3 FB Neck ROM: full    Dental no notable dental hx. (+) Teeth Intact, Dental Advisory Given   Pulmonary neg pulmonary ROS, former smoker,    Pulmonary exam normal breath sounds clear to auscultation       Cardiovascular negative cardio ROS Normal cardiovascular exam Rhythm:regular Rate:Normal     Neuro/Psych  Headaches, Pseudotumor cerebri Intracranial hypertension, benign Intractable persistent migraine aura with cerebral  Chronic migraine 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 status migrainosus, not intractable Persistent headaches   negative psych ROS   GI/Hepatic negative GI ROS, Neg liver ROS,   Endo/Other  negative endocrine ROS  Renal/GU negative Renal ROS  negative genitourinary   Musculoskeletal   Abdominal   Peds  Hematology negative hematology ROS (+)   Anesthesia Other Findings   Reproductive/Obstetrics (+) Pregnancy                             Anesthesia Physical Anesthesia Plan  ASA: 3  Anesthesia Plan: Spinal   Post-op Pain Management:    Induction:   PONV Risk Score and Plan: Scopolamine patch - Pre-op  Airway Management Planned: Natural Airway  Additional Equipment: None  Intra-op Plan:   Post-operative Plan:   Informed Consent: I have reviewed the patients History and Physical, chart, labs and discussed the procedure including the risks, benefits and alternatives for the proposed anesthesia with the patient or authorized representative who has indicated his/her understanding and acceptance.       Plan Discussed with: Anesthesiologist and CRNA  Anesthesia Plan Comments:         Anesthesia  Quick Evaluation

## 2021-09-22 NOTE — Transfer of Care (Signed)
Immediate Anesthesia Transfer of Care Note  Patient: Adriana Harrell  Procedure(s) Performed: Repeat CESAREAN SECTION WITH BILATERAL TUBAL LIGATION  Patient Location: PACU  Anesthesia Type:Spinal  Level of Consciousness: awake, alert  and oriented  Airway & Oxygen Therapy: Patient Spontanous Breathing and Patient connected to nasal cannula oxygen  Post-op Assessment: Report given to RN and Post -op Vital signs reviewed and stable  Post vital signs: Reviewed and stable  Last Vitals:  Vitals Value Taken Time  BP 109/58 09/22/21 0901  Temp    Pulse 68 09/22/21 0904  Resp 19 09/22/21 0904  SpO2 100 % 09/22/21 0904  Vitals shown include unvalidated device data.  Last Pain:  Vitals:   09/22/21 0555  TempSrc: Oral      Patients Stated Pain Goal: 1 (09/22/21 0555)  Complications: No notable events documented.

## 2021-09-22 NOTE — Anesthesia Postprocedure Evaluation (Signed)
Anesthesia Post Note  Patient: Adriana Harrell  Procedure(s) Performed: Repeat CESAREAN SECTION WITH BILATERAL TUBAL LIGATION     Patient location during evaluation: PACU Anesthesia Type: Spinal Level of consciousness: oriented and awake and alert Pain management: pain level controlled Vital Signs Assessment: post-procedure vital signs reviewed and stable Respiratory status: spontaneous breathing, respiratory function stable and patient connected to nasal cannula oxygen Cardiovascular status: blood pressure returned to baseline and stable Postop Assessment: no headache, no backache and no apparent nausea or vomiting Anesthetic complications: no   No notable events documented.  Last Vitals:  Vitals:   09/22/21 1000 09/22/21 1006  BP: 115/71 107/74  Pulse: 71 70  Resp: (!) 25 16  Temp:  (!) 36.3 C  SpO2: 100%     Last Pain:  Vitals:   09/22/21 1006  TempSrc: Oral  PainSc: 4    Pain Goal: Patients Stated Pain Goal: 1 (09/22/21 0555)  LLE Motor Response: Purposeful movement (09/22/21 0957)   RLE Motor Response: Purposeful movement (09/22/21 0957)       Epidural/Spinal Function Cutaneous sensation: Able to Discern Pressure (09/22/21 0957), Patient able to flex knees: Yes (09/22/21 0957), Patient able to lift hips off bed: No (09/22/21 0957), Back pain beyond tenderness at insertion site: No (09/22/21 0957), Progressively worsening motor and/or sensory loss: No (09/22/21 0957), Bowel and/or bladder incontinence post epidural: No (09/22/21 0957)  Brendaliz Kuk

## 2021-09-23 LAB — CBC
HCT: 26.9 % — ABNORMAL LOW (ref 36.0–46.0)
Hemoglobin: 8.7 g/dL — ABNORMAL LOW (ref 12.0–15.0)
MCH: 26.2 pg (ref 26.0–34.0)
MCHC: 32.3 g/dL (ref 30.0–36.0)
MCV: 81 fL (ref 80.0–100.0)
Platelets: 259 10*3/uL (ref 150–400)
RBC: 3.32 MIL/uL — ABNORMAL LOW (ref 3.87–5.11)
RDW: 15.5 % (ref 11.5–15.5)
WBC: 17.3 10*3/uL — ABNORMAL HIGH (ref 4.0–10.5)
nRBC: 0 % (ref 0.0–0.2)

## 2021-09-23 NOTE — Social Work (Signed)
CSW received consult for hx of Anxiety and Depression.  CSW met with MOB to offer support and complete assessment.    CSW met with MOB at bedside and introduced CSW role. CSW observed MOB up in the room and FOB up in room holding and bonding with the infant. CSW offered MOB privay CSW inquired how MOB has felt since giving birth. MOB reported feeling "good with manageable pain." MOB shared her L&D experience was good. CSW inquired how MOB felt during the pregnancy. MOB reported feeling exhausted and overwhelmed however she has felt much better since having baby. CSW inquired about MOB history of anxiety and depression. 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 MOB reported she did not have concerns with depression and anxiety during the pregnancy. 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 MOB reported she saw a therapist and took Zoloft medication for a short period of time which she felt was very helpful. CSW inquired about MOB coping skills. MOB reported she talks with sister and likes to take time for herself. CSW encouraged MOB to continue implementing her coping skills. MOB was receptive to the education provided by CSW regarding the baby blues period vs. perinatal mood disorders, discussed treatment and gave resources for mental health follow up if concerns arise. CSW also recommended MOB complete a self-evaluation during the postpartum time period using the New Mom Checklist from [employer]partum [employer] and encouraged MOB to contact a medical professional if symptoms are noted at any time. MOB was appreciative of the information provided. MOB denied SI/HI. MOB acknowledged her spouse, parents, in laws and relatives as supports.   CSW provided review of Sudden Infant Death Syndrome (SIDS) precautions. MOB was very knowledgeable of SIDS. MOB reported she has essential items for the  infant including a bassinet where the infant will sleep. MOB has chosen [facility] in [place] for infant' follow up care.   CSW identifies no further need for intervention and no barriers to discharge at this time  Bless Belshe, MSW, LCSW [facility]  Clinical Social Worker  [phone] 09/23/2021  2:25PM 

## 2021-09-23 NOTE — Lactation Note (Signed)
This note was copied from a baby's chart. Lactation Consultation Note  Patient Name: Adriana Harrell DCVUD'T Date: 09/23/2021 Reason for consult: Initial assessment Age:32 hours P3, assist mother with latching infant. Infant sustained latch for 20 mins. Observed deep latch with frequent suckles and intermittent swallows.  Infant latched to alternate breast for 10 mins. Infant still cuing.  Spoon fed infant 1/2 ml of ebm.  Infant return to breast . Observed wide gape with strong suckling.   Mother was given a hand pump with a #21 flange. She was advised to pump for 15 mins on each breast . Mother agreeable to this.   Discussed option of DBM for supplement as well as formula.  Parents think infant is getting hungry.  Advised parents to let staff nurse know what they decide on supplement.  Informed staff nurse Cloe Winecoff RN,that LC suggested supplement of DBM.    Stevan Born [facility] 09/23/2021, 3:59 PM m  Maternal Data Has patient been taught Hand Expression?: Yes Does the patient have breastfeeding experience prior to this delivery?: Yes How long did the patient breastfeed?: attempt and them pumped for 3 months with each child  Feeding Mother's Current Feeding Choice: Breast Milk  LATCH Score Latch: Grasps breast easily, tongue down, lips flanged, rhythmical sucking.  Audible Swallowing: Spontaneous and intermittent  Type of Nipple: Everted at rest and after stimulation  Comfort (Breast/Nipple): Soft / non-tender  Hold (Positioning): Assistance needed to correctly position infant at breast and maintain latch.  LATCH Score: 9   Lactation Tools Discussed/Used Tools: Flanges;Pump Flange Size: 21 Breast pump type: Manual Pump Education: Setup, frequency, and cleaning;Milk Storage Pumping frequency: after each feeding for 15 mins on each breast Pumped volume: 0.5 mL  Interventions Interventions: Breast feeding basics reviewed;Assisted with latch;Skin to  skin;Breast compression;Adjust position;Support pillows;Position options;LC Services brochure  Discharge    Consult Status Consult Status: Follow-up Date: 09/23/21 Follow-up type: In-patient    Stevan Born [facility] 09/23/2021, 3:58 PM

## 2021-09-23 NOTE — Progress Notes (Signed)
POD #1 doing well No complaints   BP 113/66 (BP Location: Right Arm)   Pulse 66   Temp 98.3 F (36.8 C) (Oral)   Resp 18   Ht 5\' 2"  (1.575 m)   Wt 74.4 kg   LMP 12/11/2020 (Exact Date)   SpO2 99%   Breastfeeding Unknown   BMI 30.00 kg/m  Results for orders placed or performed during the hospital encounter of 09/22/21 (from the past 24 hour(s))  CBC     Status: Abnormal   Collection Time: 09/23/21  5:20 AM  Result Value Ref Range   WBC 17.3 (H) 4.0 - 10.5 K/uL   RBC 3.32 (L) 3.87 - 5.11 MIL/uL   Hemoglobin 8.7 (L) 12.0 - 15.0 g/dL   HCT 09/25/21 (L) 14.4 - 31.5 %   MCV 81.0 80.0 - 100.0 fL   MCH 26.2 26.0 - 34.0 pg   MCHC 32.3 30.0 - 36.0 g/dL   RDW 40.0 86.7 - 61.9 %   Platelets 259 150 - 400 K/uL   nRBC 0.0 0.0 - 0.2 %   Abdomen is soft  Uterus non tender Bandage clean and dry and intact   POD # 1  Doing well Routine care Circ today  Discharge home tomorrow

## 2021-09-24 MED ORDER — IBUPROFEN 600 MG PO TABS: 600.0000 mg | ORAL_TABLET | Freq: Four times a day (QID) | ORAL | 0 refills | Status: AC

## 2021-09-24 MED ORDER — ACETAMINOPHEN 325 MG PO TABS: 650.0000 mg | ORAL_TABLET | ORAL | 0 refills | Status: AC | PRN

## 2021-09-24 MED ORDER — OXYCODONE HCL 5 MG PO TABS: 5.0000 mg | ORAL_TABLET | ORAL | 0 refills | Status: DC | PRN

## 2021-09-24 NOTE — Lactation Note (Signed)
This note was copied from a baby's chart. Lactation Consultation Note  Patient Name: Boy Edda Orea DBZMC'E Date: 09/24/2021 Reason for consult: Follow-up assessment;Term Age:32 hours  Mom reports that her feeding plan is as it was with her first 2 children. She has been feeding about 10-15 min each breast (per infant's preference), then pumps while Dad bottle feeds. Parents have been using the Nfant Regular flow, but paternal description of bottle feeds suggested that the Nfant Slow-Flow nipple may be better, which I provided.  I provided Mom with an extra size 21 flange for her to use. Parents know they can increase feeding volumes when giving a bottle.   Lurline Hare [facility] 09/24/2021, 11:09 AM

## 2021-09-24 NOTE — Progress Notes (Signed)
Postpartum Progress Note  Postpartum Day 2 s/p repeat Cesarean section.  Subjective:  Patient reports no overnight events.  She reports well controlled pain, ambulating without difficulty, voiding spontaneously, tolerating PO.  She reports Positive flatus, Negative BM.  Vaginal bleeding is appropriate.  Objective: Blood pressure 112/76, pulse 70, temperature 98 F (36.7 C), temperature source Oral, resp. rate 17, height 5\' 2"  (1.575 m), weight 74.4 kg, last menstrual period 12/11/2020, SpO2 99 %, unknown if currently breastfeeding.  Physical Exam:  General: alert and no distress Lochia: appropriate Uterine Fundus: firm Incision: dressing in place DVT Evaluation: No evidence of DVT seen on physical exam.  Recent Labs    09/22/21 0547 09/23/21 0520  HGB 10.3* 8.7*  HCT 31.9* 26.9*    Assessment/Plan: Postpartum Day 2, s/p C-section Baby boy, s/p circ Lactation following Acute blood loss anemia, postoperative. Fe/colace. Doing well, continue routine postpartum care. Anticipate discharge home today   LOS: 2 days   09/25/21 09/24/2021, 7:35 AM

## 2021-09-25 NOTE — Discharge Summary (Signed)
Obstetric Discharge Summary  Adriana Harrell is a 30 y.o. female that presented on 09/22/2021 for scheduled repeat C section with bilateral tubal ligation.  She was admitted to labor and delivery for  her procedure. Pregnancy has been complicated by persistent headache and pseudotumor cerebri managed by neurology. She delivered a viable female infant on 09/22/21.  Her postpartum course was uncomplicated and on PPD#2, she reported well controlled pain, spontaneous voiding, ambulating without difficulty, and tolerating PO.  She was stable for discharge home on 09/24/21 with plans for in-office follow up.  Hemoglobin  Date Value Ref Range Status  09/23/2021 8.7 (L) 12.0 - 15.0 g/dL Final   HGB  Date Value Ref Range Status  09/15/2012 15.0 12.0 - 16.0 g/dL Final   HCT  Date Value Ref Range Status  09/23/2021 26.9 (L) 36.0 - 46.0 % Final  09/15/2012 43.4 35.0 - 47.0 % Final    Physical Exam:  General: alert and no distress Lochia: appropriate Uterine Fundus: firm Incision: healing well DVT Evaluation: No evidence of DVT seen on physical exam.  Discharge Diagnoses: Term Pregnancy-delivered  Discharge Information: Date: 09/25/2021 Activity: Pelvic rest, as tolerated Diet: routine Medications: Tylenol, motrin, oxycodone Condition: stable Instructions: Refer to practice specific booklet.  Discussed prior to discharge.  Discharge to: Home  Follow-up Information     [place] Follow up.   Why: Please follow up for 6 week postartum visit. Contact information: [address] [place] [address] [phone]                 Newborn Data: Live born female  Birth Weight: 8 lb 9.9 oz (3910 g) APGAR: 8, 9  Newborn Delivery   Birth date/time: 09/22/2021 08:00:00 Delivery type: C-Section, Low Transverse Trial of labor: No C-section categorization: Repeat      Home with mother.  Lyn Henri 09/25/2021, 2:11 PM

## 2021-09-26 ENCOUNTER — Encounter (HOSPITAL_COMMUNITY): Payer: Self-pay | Admitting: Obstetrics and Gynecology

## 2021-09-26 ENCOUNTER — Observation Stay (HOSPITAL_COMMUNITY)
Admission: AD | Admit: 2021-09-26 | Discharge: 2021-09-28 | Disposition: A | Discharge status: Home / Self Care | Payer: BC Managed Care – PPO | Attending: Obstetrics and Gynecology | Admitting: Obstetrics and Gynecology

## 2021-09-26 ENCOUNTER — Other Ambulatory Visit: Payer: Self-pay

## 2021-09-26 DIAGNOSIS — O26899 Other specified pregnancy related conditions, unspecified trimester: Secondary | ICD-10-CM | POA: Insufficient documentation

## 2021-09-26 DIAGNOSIS — R1011 Right upper quadrant pain: Secondary | ICD-10-CM | POA: Diagnosis not present

## 2021-09-26 DIAGNOSIS — O1490 Unspecified pre-eclampsia, unspecified trimester: Secondary | ICD-10-CM | POA: Diagnosis not present

## 2021-09-26 DIAGNOSIS — Z20822 Contact with and (suspected) exposure to covid-19: Secondary | ICD-10-CM | POA: Insufficient documentation

## 2021-09-26 DIAGNOSIS — R519 Headache, unspecified: Secondary | ICD-10-CM | POA: Diagnosis not present

## 2021-09-26 LAB — COMPREHENSIVE METABOLIC PANEL
ALT: 50 U/L — ABNORMAL HIGH (ref 0–44)
AST: 62 U/L — ABNORMAL HIGH (ref 15–41)
Albumin: 2.6 g/dL — ABNORMAL LOW (ref 3.5–5.0)
Alkaline Phosphatase: 161 U/L — ABNORMAL HIGH (ref 38–126)
Anion gap: 6 (ref 5–15)
BUN: 13 mg/dL (ref 6–20)
CO2: 24 mmol/L (ref 22–32)
Calcium: 8.9 mg/dL (ref 8.9–10.3)
Chloride: 106 mmol/L (ref 98–111)
Creatinine, Ser: 0.63 mg/dL (ref 0.44–1.00)
GFR, Estimated: >60 mL/min (ref >60)
Glucose, Bld: 89 mg/dL (ref 70–99)
Potassium: 3.9 mmol/L (ref 3.5–5.1)
Sodium: 136 mmol/L (ref 135–145)
Total Bilirubin: 0.4 mg/dL (ref 0.3–1.2)
Total Protein: 5.8 g/dL — ABNORMAL LOW (ref 6.5–8.1)

## 2021-09-26 LAB — CBC
HCT: 27.9 % — ABNORMAL LOW (ref 36.0–46.0)
Hemoglobin: 9.1 g/dL — ABNORMAL LOW (ref 12.0–15.0)
MCH: 26.8 pg (ref 26.0–34.0)
MCHC: 32.6 g/dL (ref 30.0–36.0)
MCV: 82.1 fL (ref 80.0–100.0)
Platelets: 291 10*3/uL (ref 150–400)
RBC: 3.4 MIL/uL — ABNORMAL LOW (ref 3.87–5.11)
RDW: 15.5 % (ref 11.5–15.5)
WBC: 9.3 10*3/uL (ref 4.0–10.5)
nRBC: 0 % (ref 0.0–0.2)

## 2021-09-26 LAB — RESP PANEL BY RT-PCR (FLU A&B, COVID) ARPGX2
Influenza A by PCR: NEGATIVE
Influenza B by PCR: NEGATIVE
SARS Coronavirus 2 by RT PCR: NEGATIVE

## 2021-09-26 LAB — TYPE AND SCREEN
ABO/RH(D): O NEG
Antibody Screen: POSITIVE

## 2021-09-26 MED ORDER — MAGNESIUM SULFATE BOLUS VIA INFUSION
4.0000 g | Freq: Once | INTRAVENOUS | Status: AC
  Administered 2021-09-26: 4 g via INTRAVENOUS
  Filled 2021-09-26: qty 1000

## 2021-09-26 MED ORDER — LABETALOL HCL 5 MG/ML IV SOLN: 80.0000 mg | INTRAVENOUS | Status: DC | PRN

## 2021-09-26 MED ORDER — LABETALOL HCL 5 MG/ML IV SOLN: 40.0000 mg | INTRAVENOUS | Status: DC | PRN

## 2021-09-26 MED ORDER — OXYCODONE HCL 5 MG PO TABS
10.0000 mg | ORAL_TABLET | Freq: Once | ORAL | Status: AC
  Administered 2021-09-26: 10 mg via ORAL
  Filled 2021-09-26: qty 2

## 2021-09-26 MED ORDER — BUTALBITAL-APAP-CAFFEINE 50-325-40 MG PO TABS
1.0000 | ORAL_TABLET | Freq: Four times a day (QID) | ORAL | Status: DC | PRN
  Administered 2021-09-26 – 2021-09-27 (×2): 1 via ORAL
  Filled 2021-09-26 (×2): qty 1

## 2021-09-26 MED ORDER — LACTATED RINGERS IV SOLN: INTRAVENOUS | Status: DC

## 2021-09-26 MED ORDER — LABETALOL HCL 5 MG/ML IV SOLN: 20.0000 mg | INTRAVENOUS | Status: DC | PRN

## 2021-09-26 MED ORDER — DOCUSATE SODIUM 100 MG PO CAPS: 100.0000 mg | ORAL_CAPSULE | Freq: Every day | ORAL | Status: DC | PRN

## 2021-09-26 MED ORDER — HYDRALAZINE HCL 20 MG/ML IJ SOLN: 10.0000 mg | INTRAMUSCULAR | Status: DC | PRN

## 2021-09-26 MED ORDER — MAGNESIUM SULFATE 40 GM/1000ML IV SOLN
2.0000 g/h | INTRAVENOUS | Status: AC
  Filled 2021-09-26 (×2): qty 1000

## 2021-09-26 MED ORDER — SIMETHICONE 80 MG PO CHEW
80.0000 mg | CHEWABLE_TABLET | Freq: Four times a day (QID) | ORAL | Status: DC | PRN
  Administered 2021-09-26: 80 mg via ORAL
  Filled 2021-09-26: qty 1

## 2021-09-26 MED ORDER — FERROUS SULFATE 325 (65 FE) MG PO TABS
325.0000 mg | ORAL_TABLET | ORAL | Status: DC
  Administered 2021-09-26 – 2021-09-28 (×2): 325 mg via ORAL
  Filled 2021-09-26 (×3): qty 1

## 2021-09-26 NOTE — MAU Note (Addendum)
Pt had a repeat c/o on 11/22, here today c/o of dizzy spells on Friday 11/25, BP 138/78, rested and it went away. Today, dizzy spells again,  HA and BP 156/96. Pain in ribs, feels like trapped gas but she's passing gas. Last  BP 171/100. Took oxycodone 5mg  at 230pm, tylenol 1000mg  and ibuprofen 600mg  at 4pm. Called OB and was told to come in.

## 2021-09-26 NOTE — MAU Provider Note (Signed)
Chief Complaint:  Headache   Event Date/Time   First Provider Initiated Contact with Patient 09/26/21 1928     HPI: Adriana Harrell is a 30 y.o. [phone] on PPD#4 after repeat CS with BTL who presents to maternity admissions reporting left-sided migraine that does not completely resolve with medication (last took 5mg  oxycodone at 2:30pm, 1000mg  Tylenol/600mg  ibuprofen at  4pm). Headache currently 4/10. Also reports several dizzy spells and increasing blood pressure. Had a reading of 156/96 and 171/100  which prompted her to come in for evaluation. No other physical complaints, reports she is eating/hydrating/resting well at home with good familial support. Mother at bedside for support.  Pregnancy Course: Receives care at [facility], prenatal records reviewed. Of note, pt was diagnosed with pseudotumor cerebri in pregnancy and had several episodes of severe headaches. Reports that her headache today feels different and not as severe as what she experienced prior.  Past Medical History:  Diagnosis Date   Migraine    OB History  Gravida Para Term Preterm AB Living  4 3 3   1 3   SAB IAB Ectopic Multiple Live Births  1     0 3    # Outcome Date GA Lbr Len/2nd Weight Sex Delivery Anes PTL Lv  4 Term 09/22/21 [redacted]w[redacted]d  8 lb 9.9 oz (3.91 kg) M CS-LTranv Spinal  LIV     Birth Comments: WDL  3 Term 05/31/16     CS-Unspec   LIV  2 Term 07/15/14     CS-Unspec   LIV  1 SAB 2013 [redacted]w[redacted]d          Past Surgical History:  Procedure Laterality Date   CESAREAN SECTION     x2   CESAREAN SECTION WITH BILATERAL TUBAL LIGATION N/A 09/22/2021   Procedure: Repeat CESAREAN SECTION WITH BILATERAL TUBAL LIGATION;  Surgeon: Louretta Shorten, MD;  Location: [place] LD ORS;  Service: Obstetrics;  Laterality: N/A;   TONSILLECTOMY     Family History  Problem Relation Age of Onset   Cancer Mother    Stroke Father    Social History   Tobacco Use   Smoking status: Former   Smokeless tobacco: Never   Substance Use Topics   Alcohol use: Not Currently   Drug use: Not Currently   Allergies  Allergen Reactions   Penicillins Hives   Tamiflu [Oseltamivir Phosphate] Hives   Tamiflu [Oseltamivir] Hives   Medications Prior to Admission  Medication Sig Dispense Refill Last Dose   acetaminophen (TYLENOL) 325 MG tablet Take 2 tablets (650 mg total) by mouth every 4 (four) hours as needed for mild pain (temperature > 101.5.). 30 tablet 0    butalbital-acetaminophen-caffeine (FIORICET) 50-325-40 MG tablet Take 1 tablet by mouth every 6 (six) hours as needed for headache. 12 tablet 5    butorphanol (STADOL) 10 MG/ML nasal spray Place 1-2 sprays into the nose every 4 (four) hours as needed for headache. 2.5 mL 0    ibuprofen (ADVIL) 600 MG tablet Take 1 tablet (600 mg total) by mouth every 6 (six) hours. 30 tablet 0    magnesium oxide (MAG-OX) 400 (240 Mg) MG tablet Take 2 tablets (800 mg total) by mouth daily. 30 tablet 0    oxyCODONE (ROXICODONE) 5 MG immediate release tablet Take 1 tablet (5 mg total) by mouth every 4 (four) hours as needed for severe pain. 18 tablet 0    Prenatal Vit-Fe Fumarate-FA (PRENATAL MULTIVITAMIN) TABS tablet Take 1 tablet by mouth daily  at 12 noon.      promethazine (PHENERGAN) 12.5 MG tablet Take 1 tablet (12.5 mg total) by mouth every 6 (six) hours as needed for nausea or vomiting. 30 tablet 0    I have reviewed patient's Past Medical Hx, Surgical Hx, Family Hx, Social Hx, medications and allergies.   ROS:  Review of Systems  Constitutional:  Negative for fatigue and fever.  HENT:  Negative for congestion and sore throat.   Eyes:  Negative for photophobia and visual disturbance.  Respiratory:  Negative for cough, chest tightness and shortness of breath.   Cardiovascular:  Negative for chest pain.  Gastrointestinal:  Positive for abdominal pain (post-op pain, as expected). Negative for nausea and vomiting.  Neurological:  Positive for dizziness and headaches.  Negative for syncope and light-headedness.   Physical Exam  Patient Vitals for the past 24 hrs:  BP Temp Temp src Pulse Resp SpO2 Height Weight  09/26/21 2000 127/65 -- -- (!) 55 -- -- -- --  09/26/21 1930 127/71 -- -- 65 -- -- -- --  09/26/21 1928 -- -- -- -- -- -- 5\' 2"  (1.575 m) 157 lb (71.2 kg)  09/26/21 1917 -- -- -- 67 -- 100 % -- --  09/26/21 1916 -- -- -- -- -- 99 % -- --  09/26/21 1915 137/66 98.3 F (36.8 C) Oral (!) 57 17 -- -- --   Constitutional: Well-developed, well-nourished female in no acute distress.  Cardiovascular: normal rate & rhythm Respiratory: normal effort GI: Abd soft, non-tender, gravid appropriate for gestational age MS: Extremities nontender, no edema, normal ROM Neurologic: Alert and oriented x 4.  GU: no CVA tenderness Pelvic: exam deferred  Labs: Results for orders placed or performed during the hospital encounter of 09/26/21 (from the past 24 hour(s))  CBC     Status: Abnormal   Collection Time: 09/26/21  7:46 PM  Result Value Ref Range   WBC 9.3 4.0 - 10.5 K/uL   RBC 3.40 (L) 3.87 - 5.11 MIL/uL   Hemoglobin 9.1 (L) 12.0 - 15.0 g/dL   HCT 09/28/21 (L) 19.4 - 17.4 %   MCV 82.1 80.0 - 100.0 fL   MCH 26.8 26.0 - 34.0 pg   MCHC 32.6 30.0 - 36.0 g/dL   RDW 08.1 44.8 - 18.5 %   Platelets 291 150 - 400 K/uL   nRBC 0.0 0.0 - 0.2 %  Comprehensive metabolic panel     Status: Abnormal   Collection Time: 09/26/21  7:46 PM  Result Value Ref Range   Sodium 136 135 - 145 mmol/L   Potassium 3.9 3.5 - 5.1 mmol/L   Chloride 106 98 - 111 mmol/L   CO2 24 22 - 32 mmol/L   Glucose, Bld 89 70 - 99 mg/dL   BUN 13 6 - 20 mg/dL   Creatinine, Ser 09/28/21 0.44 - 1.00 mg/dL   Calcium 8.9 8.9 - 4.97 mg/dL   Total Protein 5.8 (L) 6.5 - 8.1 g/dL   Albumin 2.6 (L) 3.5 - 5.0 g/dL   AST 62 (H) 15 - 41 U/L   ALT 50 (H) 0 - 44 U/L   Alkaline Phosphatase 161 (H) 38 - 126 U/L   Total Bilirubin 0.4 0.3 - 1.2 mg/dL   GFR, Estimated 02.6 >37 mL/min   Anion gap 6 5 - 15    Imaging:  No results found.  MAU Course: Orders Placed This Encounter  Procedures   Resp Panel by RT-PCR (Flu A&B, Covid) Nasopharyngeal Swab  CBC   Comprehensive metabolic panel   CBC   Comprehensive metabolic panel   Lactate dehydrogenase   Uric acid   Diet regular Room service appropriate? Yes; Fluid consistency: Thin   Notify physician (specify) Confirmatory reading of BP> 160/110 15 minutes later   Initiate Oral Care Protocol   Initiate Carrier Fluid Protocol   SCDs   Measure blood pressure   Full code   Type and screen [place] in observation (patient's expected length of stay will be less than 2 midnights)   Meds ordered this encounter  Medications   oxyCODONE (Oxy IR/ROXICODONE) immediate release tablet 10 mg   magnesium bolus via infusion 4 g   magnesium sulfate 40 grams in SWI 1000 mL OB infusion   AND Linked Order Group    labetalol (NORMODYNE) injection 20 mg    labetalol (NORMODYNE) injection 40 mg    labetalol (NORMODYNE) injection 80 mg    hydrALAZINE (APRESOLINE) injection 10 mg   ferrous sulfate tablet 325 mg   docusate sodium (COLACE) capsule 100 mg   lactated ringers infusion   MDM: Normotensive on admission to MAU, but headache remains. Given 10mg  of oxycodone with good relief.   Elevated LFTs, discussed findings with Dr. Elonda Husky who recommended admission to [facility] for magnesium therapy. Report called to Dr. Mardelle Matte who agreed to admission and will place orders/assume care of patient.  Discussed findings and MD recommendation with patient, all questions answered.  Assessment: Postpartum preeclampsia  Plan: Admit to [facility] for magnesium therapy Care turned over to [facility] Dr. Mardelle Matte.  Gaylan Gerold, CNM, MSN, [place] Certified Nurse Midwife, [facility]

## 2021-09-26 NOTE — H&P (Signed)
OB History and Physical   Adriana Harrell is a 30 y.o. female [phone] presenting for headache, elevated blood pressures on postpartum day 4.  She underwent scheduled repeat C section on 09/22/21 and had an uncomplicated postpartum course. She was normotensive during that admission. She has a notable hsitory of pseudotumor cerebri throughout pregnancy which has been managed by neuro.  At time of delivery and during delivery admission, her headache was resolved.  Patient reports new onset headache since yesterday that is distinctly "different" than pseudotumor cerebri headache.  She also feels dizzy and has RUQ tenderness.    She took multiple home blood pressures were were elevated, max home BP 171/100.  Here upon MAU evaluation, she has been normotensive but unresolved headache and LFTs were mildly elevated.   Postoperatively, she has no complaints. She reports well controlled abdominal/incisional pain, passing flatus, +BM, voiding well. She denies fever, chills, SOB, nausea, vomiting, distension.    OB History     Gravida  4   Para  3   Term  3   Preterm      AB  1   Living  3      SAB  1   IAB      Ectopic      Multiple  0   Live Births  3          Past Medical History:  Diagnosis Date   Migraine    Past Surgical History:  Procedure Laterality Date   CESAREAN SECTION     x2   CESAREAN SECTION WITH BILATERAL TUBAL LIGATION N/A 09/22/2021   Procedure: Repeat CESAREAN SECTION WITH BILATERAL TUBAL LIGATION;  Surgeon: Candice Camp, MD;  Location: [facility] LD ORS;  Service: Obstetrics;  Laterality: N/A;   TONSILLECTOMY     Family History: family history includes Cancer in her mother; Stroke in her father. Social History:  reports that she has quit smoking. She has never used smokeless tobacco. She reports that she does not currently use alcohol. She reports that she does not currently use drugs.   Review of Systems See HPI History   Blood pressure 127/65, pulse (!)  55, temperature 98.3 F (36.8 C), temperature source Oral, resp. rate 17, height 5\' 2"  (1.575 m), weight 71.2 kg, SpO2 100 %, unknown if currently breastfeeding. Exam Physical Exam   Gen: alert, well appearing, no distress Chest: nonlabored breathing CV: no peripheral edema Abdomen: soft, nontender Ext: no evidence of DVT Neuro - no hyperreflexia or clonus.   Assessment/Plan: Preeclampsia in postpartum period Persistent headache that is distinctly different from pseudotumor cerebri headache. Elevated AST/ALT to 62/50. Will trend HELLP labs. 24 hrs magnesium  Postoperative State Doing well, tylenol/motrin/oxy as needed Fe/colace for acute blood loss anemia Postpartum state - currently breastfeeding.   09/26/2021, 8:59 PM

## 2021-09-27 ENCOUNTER — Observation Stay (HOSPITAL_COMMUNITY): Payer: BC Managed Care – PPO

## 2021-09-27 DIAGNOSIS — O1490 Unspecified pre-eclampsia, unspecified trimester: Secondary | ICD-10-CM | POA: Diagnosis not present

## 2021-09-27 DIAGNOSIS — G43919 Migraine, unspecified, intractable, without status migrainosus: Secondary | ICD-10-CM | POA: Diagnosis not present

## 2021-09-27 LAB — COMPREHENSIVE METABOLIC PANEL
ALT: 57 U/L — ABNORMAL HIGH (ref 0–44)
AST: 64 U/L — ABNORMAL HIGH (ref 15–41)
Albumin: 2.7 g/dL — ABNORMAL LOW (ref 3.5–5.0)
Alkaline Phosphatase: 164 U/L — ABNORMAL HIGH (ref 38–126)
Anion gap: 9 (ref 5–15)
BUN: 11 mg/dL (ref 6–20)
CO2: 27 mmol/L (ref 22–32)
Calcium: 8.2 mg/dL — ABNORMAL LOW (ref 8.9–10.3)
Chloride: 100 mmol/L (ref 98–111)
Creatinine, Ser: 0.55 mg/dL (ref 0.44–1.00)
GFR, Estimated: >60 mL/min (ref >60)
Glucose, Bld: 85 mg/dL (ref 70–99)
Potassium: 3.7 mmol/L (ref 3.5–5.1)
Sodium: 136 mmol/L (ref 135–145)
Total Bilirubin: 0.2 mg/dL — ABNORMAL LOW (ref 0.3–1.2)
Total Protein: 6.2 g/dL — ABNORMAL LOW (ref 6.5–8.1)

## 2021-09-27 LAB — CBC
HCT: 31.4 % — ABNORMAL LOW (ref 36.0–46.0)
Hemoglobin: 10.1 g/dL — ABNORMAL LOW (ref 12.0–15.0)
MCH: 26.4 pg (ref 26.0–34.0)
MCHC: 32.2 g/dL (ref 30.0–36.0)
MCV: 82 fL (ref 80.0–100.0)
Platelets: 323 10*3/uL (ref 150–400)
RBC: 3.83 MIL/uL — ABNORMAL LOW (ref 3.87–5.11)
RDW: 15.7 % — ABNORMAL HIGH (ref 11.5–15.5)
WBC: 8.4 10*3/uL (ref 4.0–10.5)
nRBC: 0 % (ref 0.0–0.2)

## 2021-09-27 LAB — URIC ACID: Uric Acid, Serum: 5.5 mg/dL (ref 2.5–7.1)

## 2021-09-27 LAB — LACTATE DEHYDROGENASE: LDH: 237 U/L — ABNORMAL HIGH (ref 98–192)

## 2021-09-27 IMAGING — MR MR MRV HEAD W/O CM
3 series · 19 of 48 positions shown · non-contrast
Comparison: No pertinent prior exam. Same day MRI brain [DATE].

CLINICAL DATA: Headache, papilledema. Additional history provided:
Patient is breast feeding, 5 days postpartum, history of migraines.

EXAM:
MR VENOGRAM HEAD WITHOUT CONTRAST
TECHNIQUE: Angiographic images of the intracranial venous structures were
acquired using MRV technique without intravenous contrast.

[Series 6: sag inhance(25 venc) · sagittal · 1.6mm · 0.47mm/px · 11 of 399 slices shown]
[im 21/399]
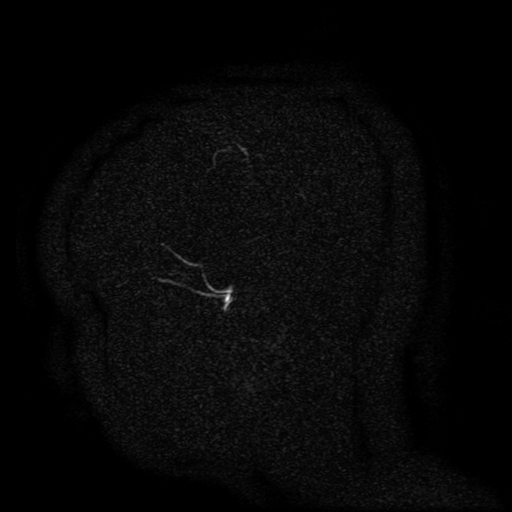
[im 63/399]
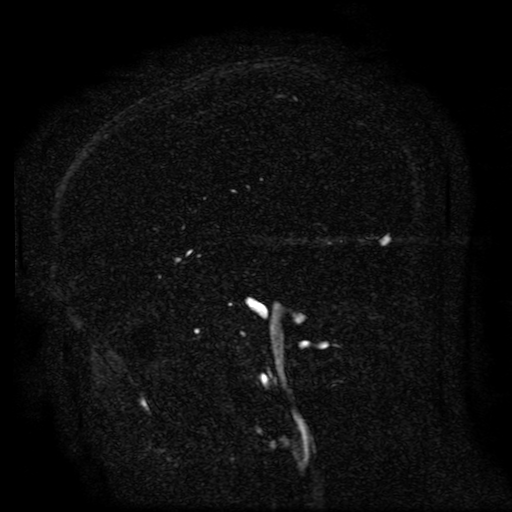
[im 74/399]
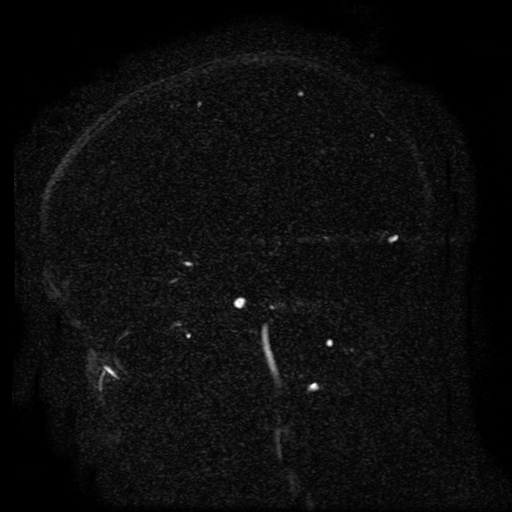
[im 126/399]
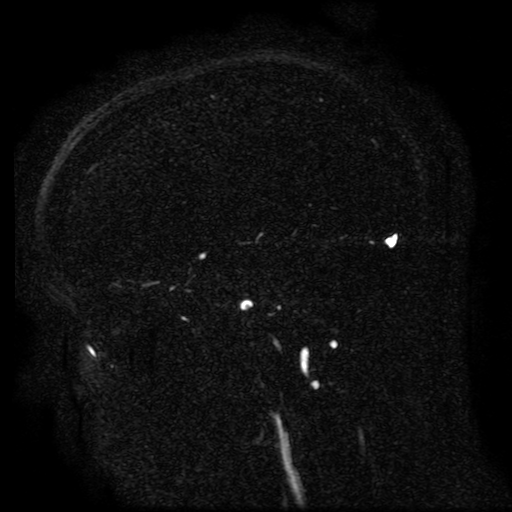
[im 179/399]
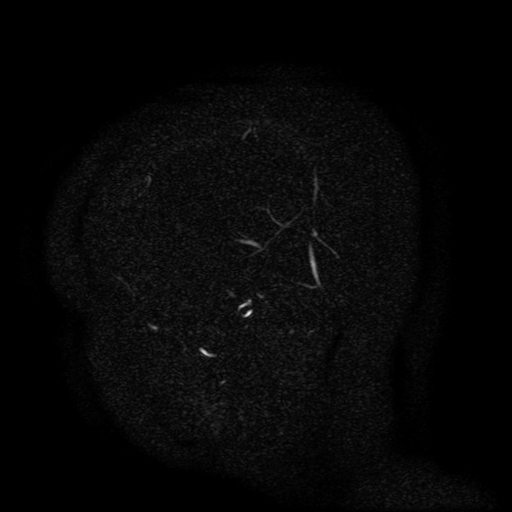
[im 200/399]
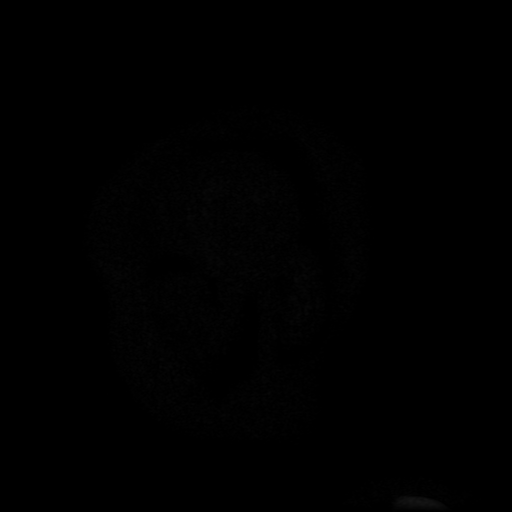
[im 220/399]
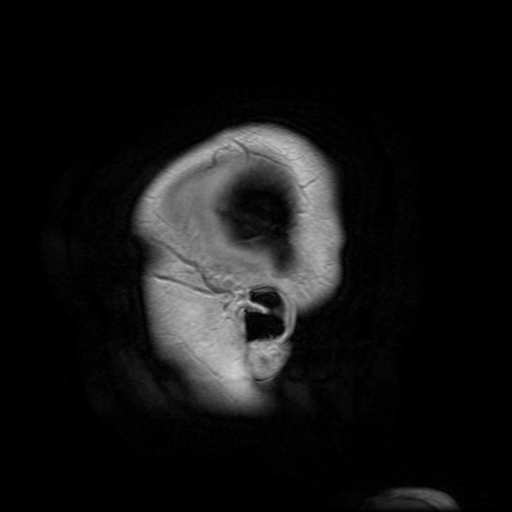
[im 273/399]
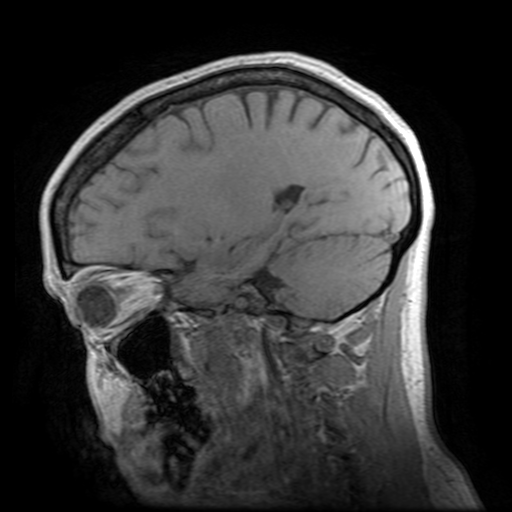
[im 325/399]
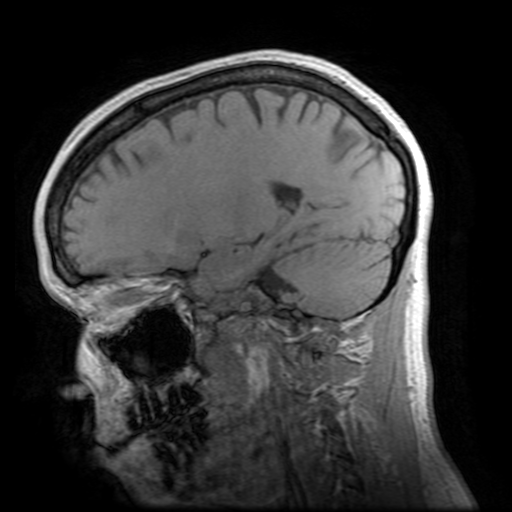
[im 336/399]
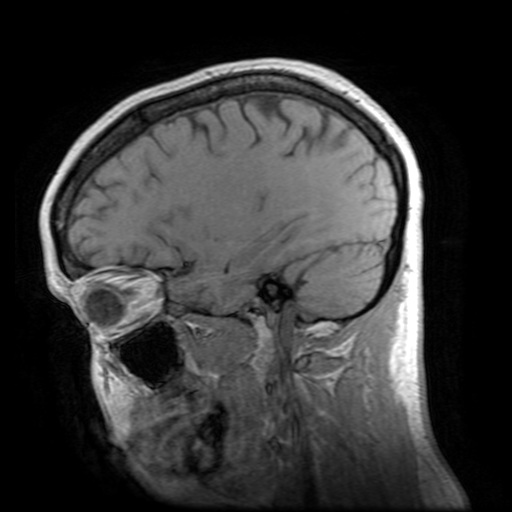
[im 378/399]
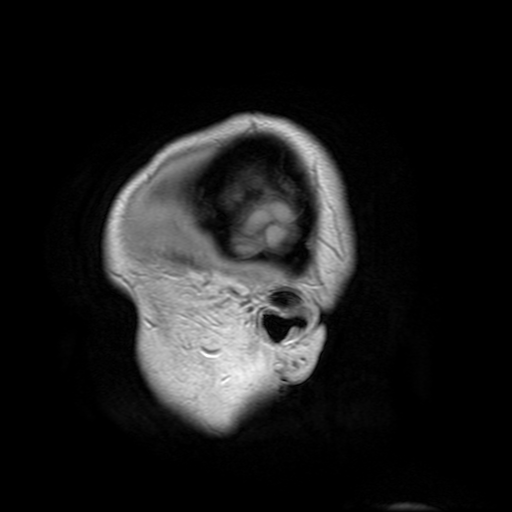

[Series 7: MRV · coronal · 1.5mm · 0.47mm/px · 7 of 75 slices shown (1 of 2)]
[im 1/75]
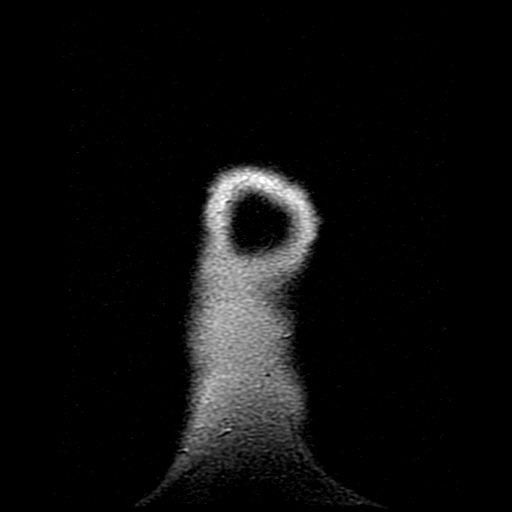
[im 11/75]
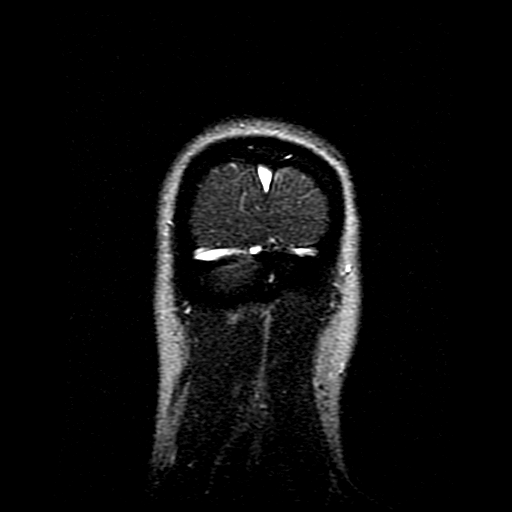
[im 22/75]
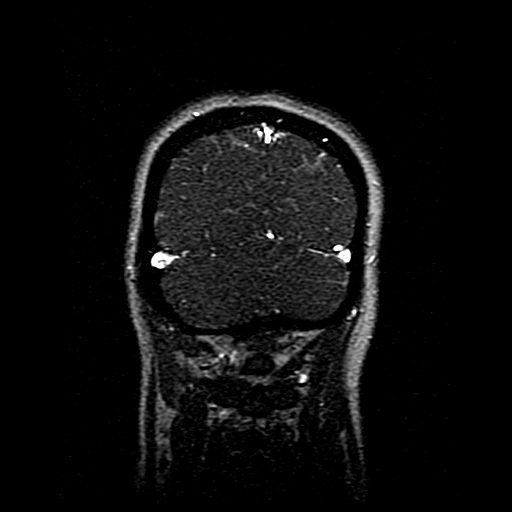
[im 32/75]
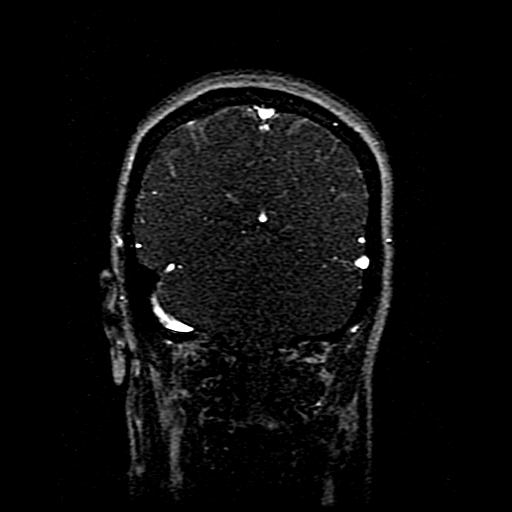
[im 43/75]
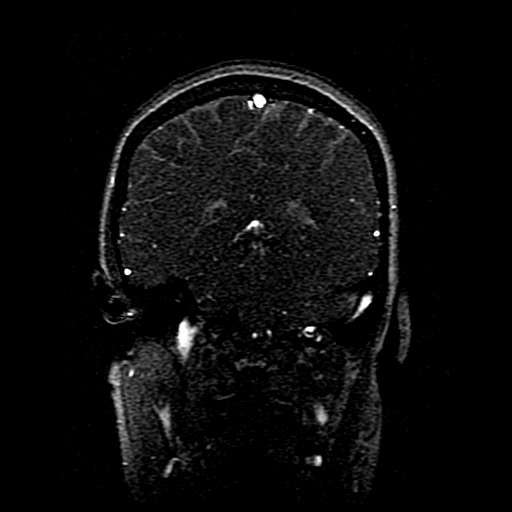
[im 53/75]
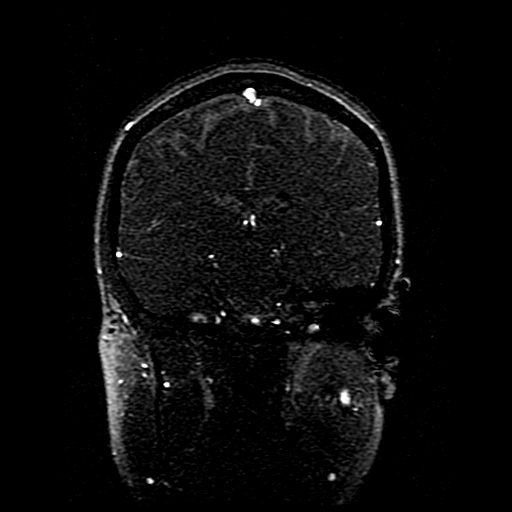
[im 64/75]
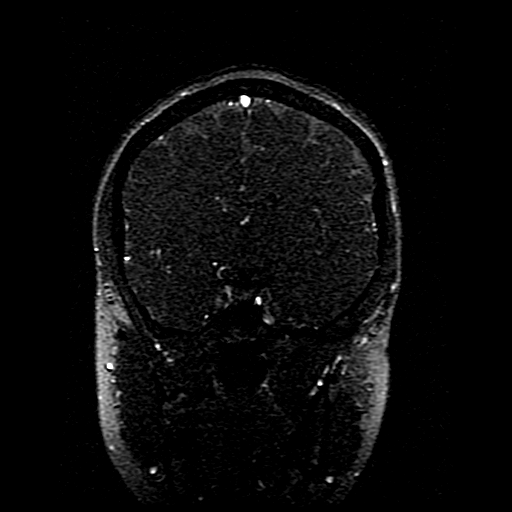

[Series 701: MRV · axial · 1.5mm · 0.47mm/px · 1 of 2 slices shown (2 of 2)]
[im 1/2]
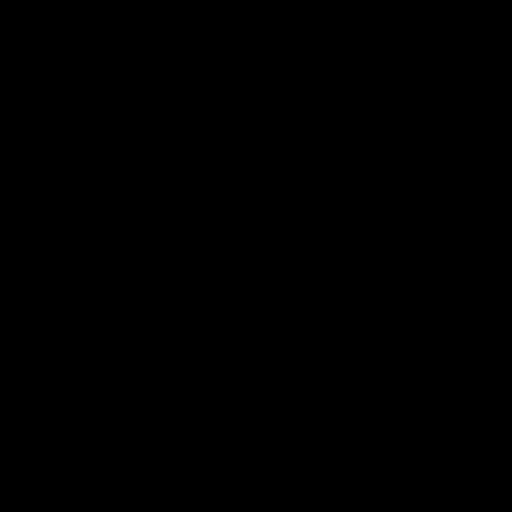

[19 of 48 positions shown; findings below may reference images not displayed]

FINDINGS: The superior sagittal sinus, internal cerebral veins, vein of ABSOLON,
straight sinus, transverse sinuses, sigmoid sinuses and visualized
jugular veins are patent. The artifact within the upper internal
jugular veins, bilaterally. There is no appreciable intracranial
venous thrombosis. No evidence of dural venous sinus stenosis. The
right transverse and sigmoid dural venous sinuses are dominant.
IMPRESSION: No evidence of intracranial venous thrombosis.

No evidence of dural venous sinus stenosis.

## 2021-09-27 IMAGING — MR MR HEAD W/O CM
9 of 10 series · 38 of 48 positions shown · non-contrast
Comparison: Brain MRI [DATE].

CLINICAL DATA: Headache, papilledema. Neuro deficit, acute, stroke
suspected. Additional history provided: Patient is currently breast
feeding, 5 days postpartum, history of migraines.

EXAM:
MRI HEAD WITHOUT CONTRAST
TECHNIQUE: Multiplanar, multiecho pulse sequences of the brain and surrounding
structures were obtained without intravenous contrast.

[Series 3: DWI · axial · 3.0mm · 1.09mm/px · z∈[-69,+63]mm · 11 of 92 slices shown (1 of 4)]
[im 1/92]
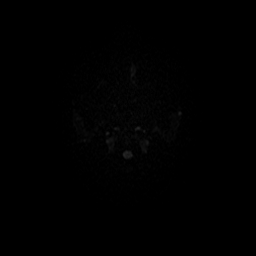
[im 10/92]
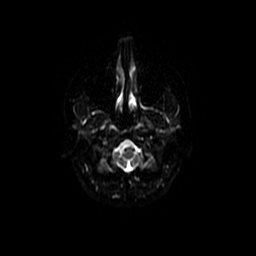
[im 19/92]
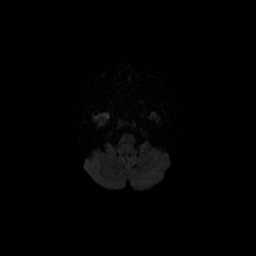
[im 28/92]
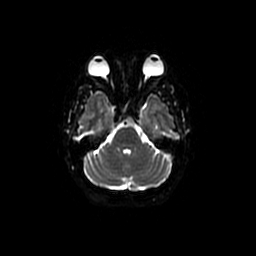
[im 37/92]
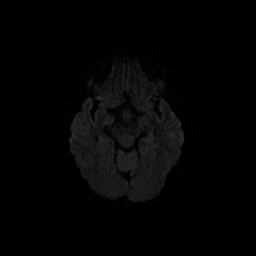
[im 46/92]
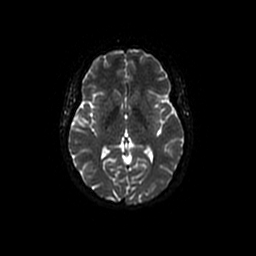
[im 55/92]
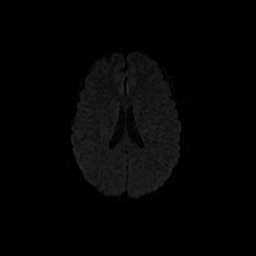
[im 64/92]
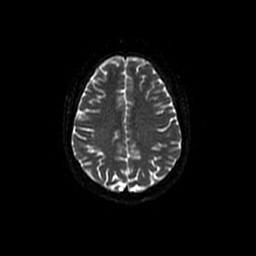
[im 73/92]
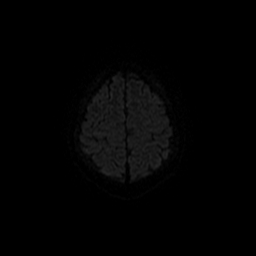
[im 82/92]
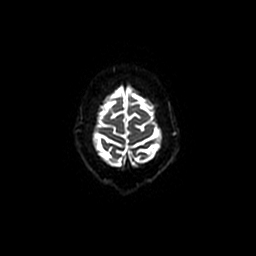
[im 92/92]
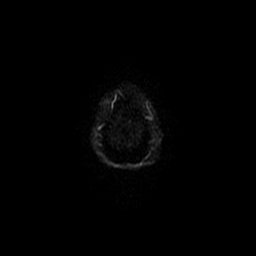

[Series 8: DWI · coronal · 5.0mm · 1.09mm/px · 7 of 68 slices shown (2 of 4)]
[im 1/68]
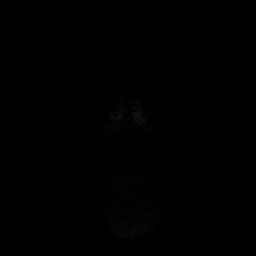
[im 12/68]
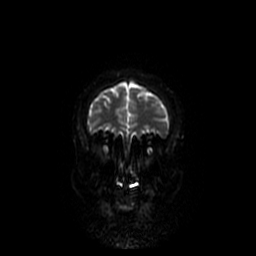
[im 23/68]
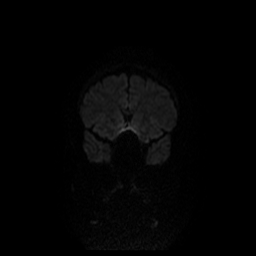
[im 34/68]
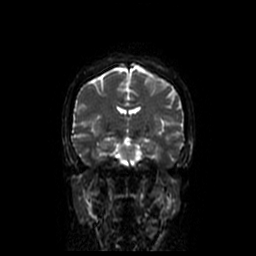
[im 45/68]
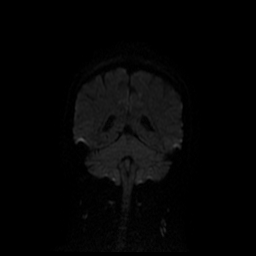
[im 56/68]
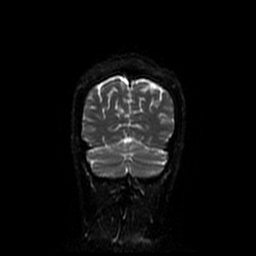
[im 68/68]
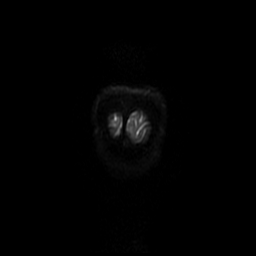

[Series 9: T1 · sagittal · 5.0mm · 0.47mm/px · 2 of 22 slices shown (1 of 2)]
[im 1/22]
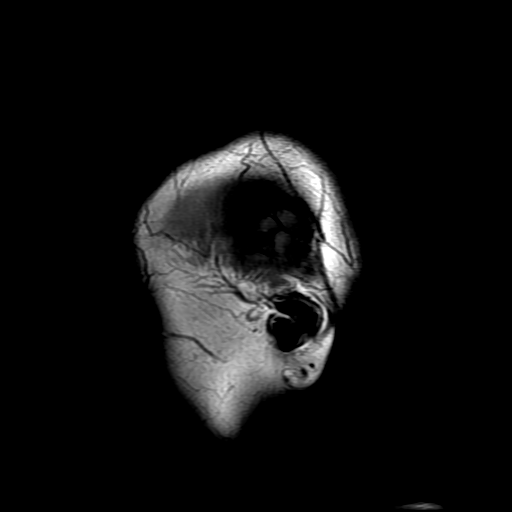
[im 22/22]
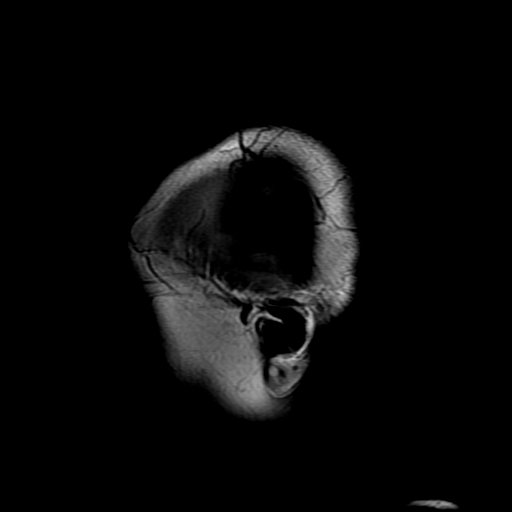

[Series 10: T2 · axial · 5.0mm · 0.43mm/px · z∈[-59,+64]mm · 2 of 22 slices shown]
[im 1/22]
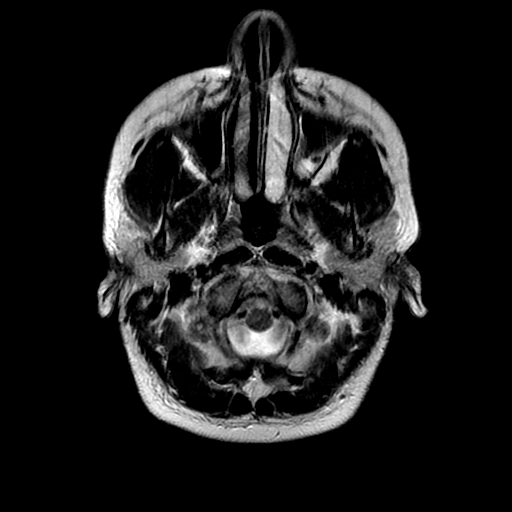
[im 22/22]
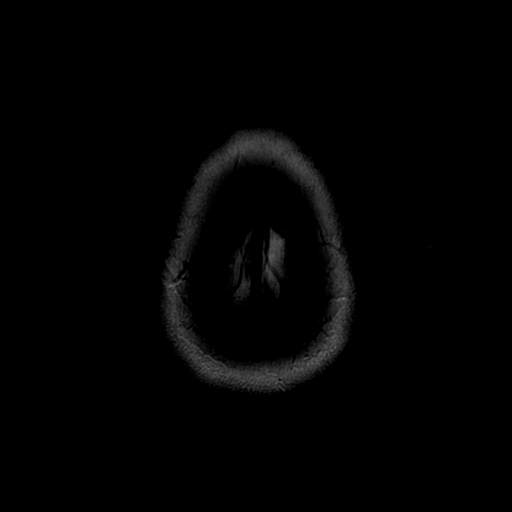

[Series 11: FLAIR · axial · 3.0mm · 0.43mm/px · z∈[-59,+64]mm · 2 of 22 slices shown]
[im 1/22]
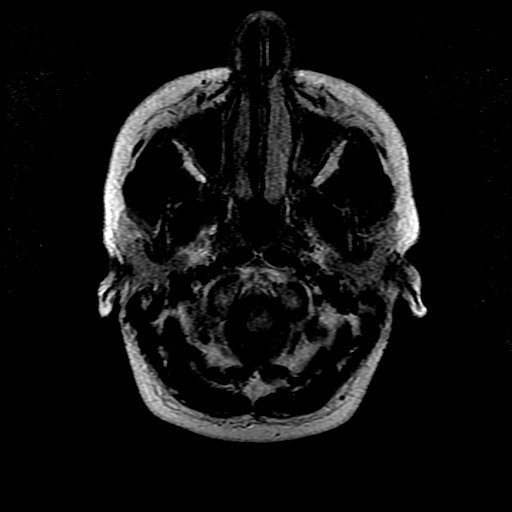
[im 22/22]
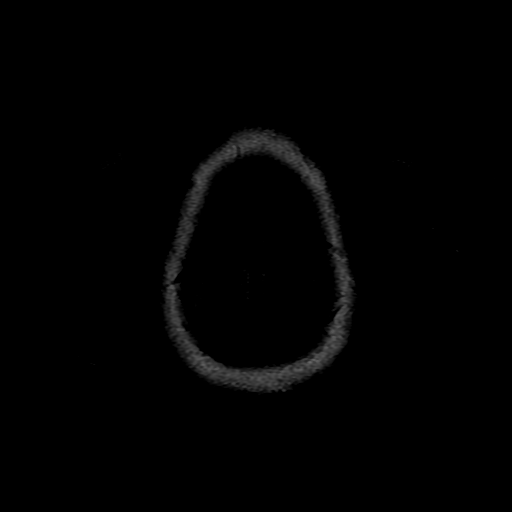

[Series 13: T1 · axial · 3.0mm · 0.47mm/px · z∈[-63,-49]mm · 2 of 88 slices shown (2 of 2)]
[im 1/88]
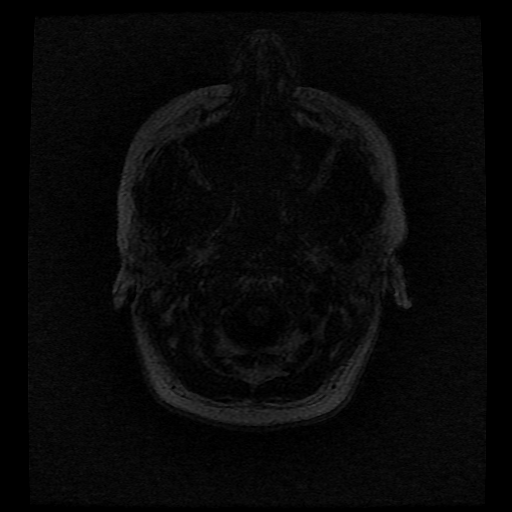
[im 10/88]
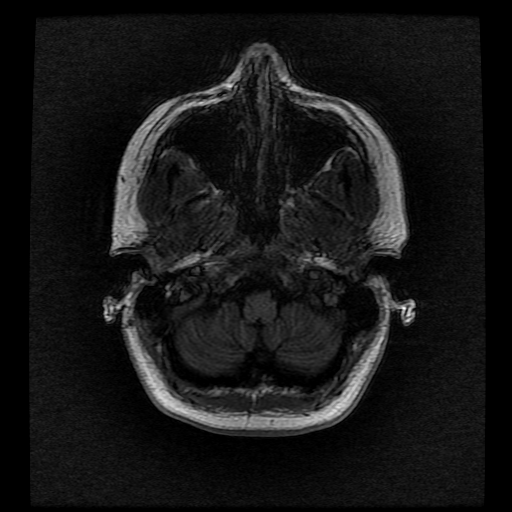

[Series 14: T2 post-contrast · coronal · 5.0mm · 0.39mm/px · 3 of 25 slices shown]
[im 1/25]
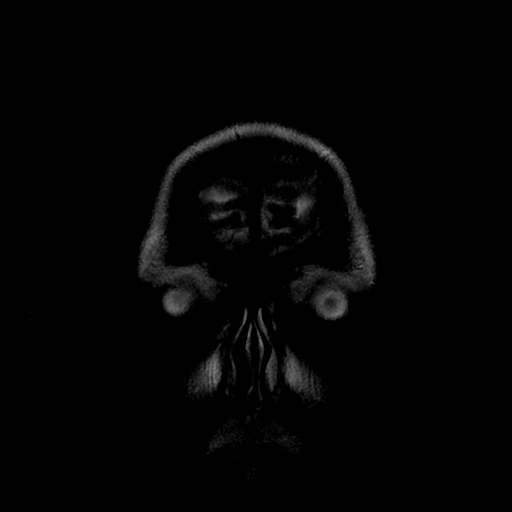
[im 13/25]
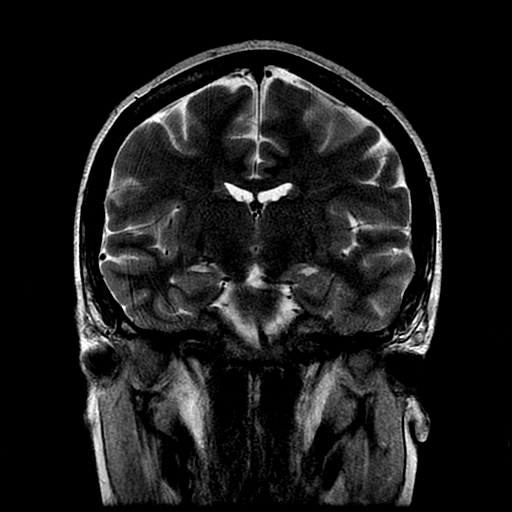
[im 25/25]
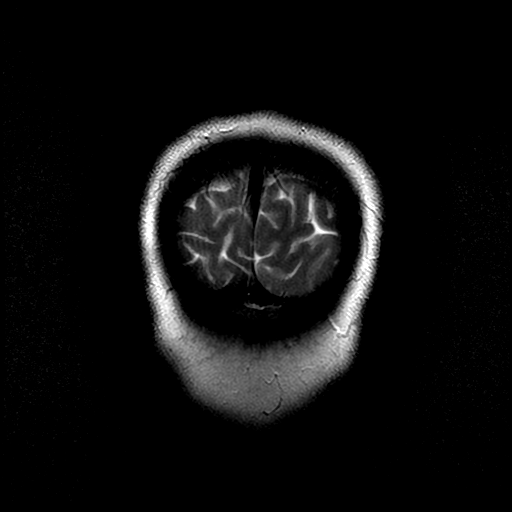

[Series 300: DWI · axial · 3.0mm · 1.09mm/px · z∈[-69,+63]mm · 5 of 46 slices shown (3 of 4)]
[im 1/46]
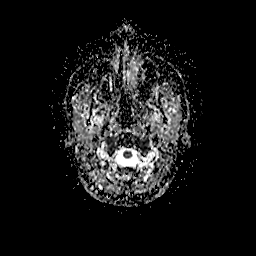
[im 12/46]
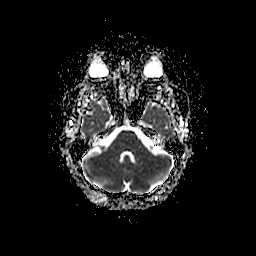
[im 23/46]
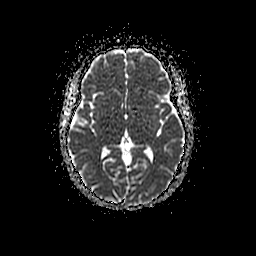
[im 34/46]
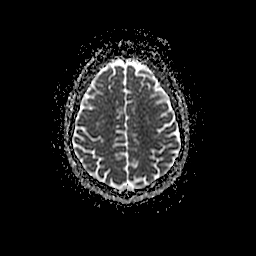
[im 46/46]
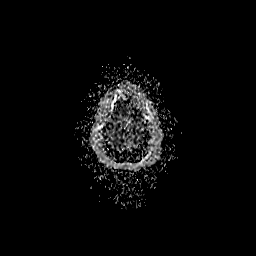

[Series 800: DWI · coronal · 5.0mm · 1.09mm/px · 4 of 34 slices shown (4 of 4)]
[im 1/34]
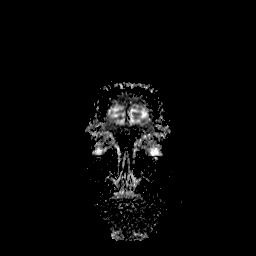
[im 12/34]
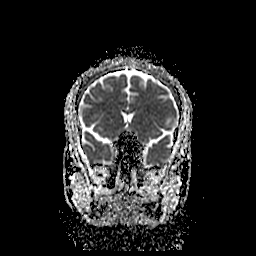
[im 23/34]
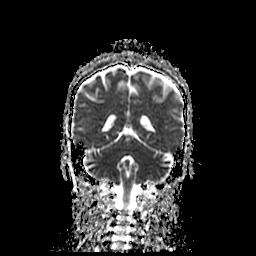
[im 34/34]
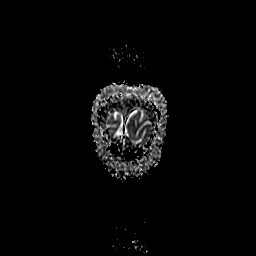

[38 of 48 positions shown; findings below may reference images not displayed]

FINDINGS: Brain:

Mild intermittent motion degradation.

Cerebral volume is normal.

No cortical encephalomalacia is identified. No significant cerebral
white matter disease.

There is no acute infarct.

No evidence of an intracranial mass.

No chronic intracranial blood products.

No extra-axial fluid collection.

No midline shift.

Vascular: Maintained flow voids within the proximal large arterial
vessels.

Skull and upper cervical spine: Focal suspicious marrow lesion.

Sinuses/Orbits: Visualized orbits show no acute finding. Mild
paranasal sinus mucosal thickening, greatest within the left
maxillary sinus.
IMPRESSION: Mildly motion degraded exam.

Unremarkable non-contrast MRI appearance of the brain. No evidence
of acute intracranial abnormality.

Mild paranasal sinus mucosal thickening, most notably left
maxillary.

## 2021-09-27 IMAGING — US US ABDOMEN LIMITED
1 series · 15 of 25 positions shown · non-contrast
Comparison: [DATE]

CLINICAL DATA: RIGHT upper quadrant pain and mildly elevated LFTs,
post Caesarean section 5 days ago

EXAM:
ULTRASOUND ABDOMEN LIMITED RIGHT UPPER QUADRANT

[Series 1: us abdomen limited · 15 of 41 slices shown]
[im 1/41]
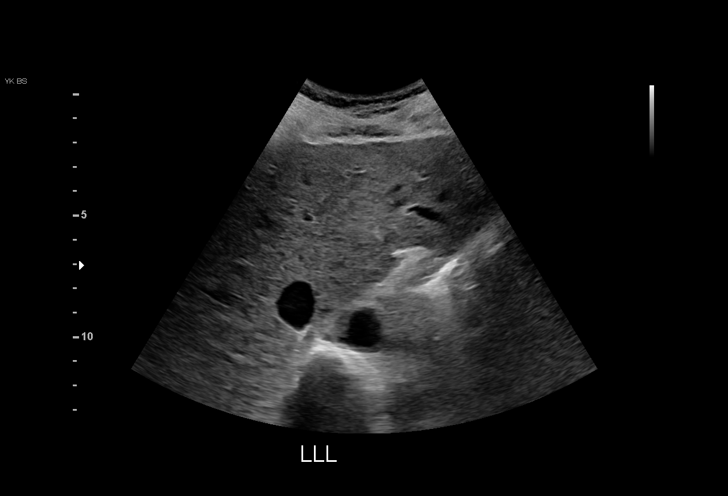
[im 4/41]
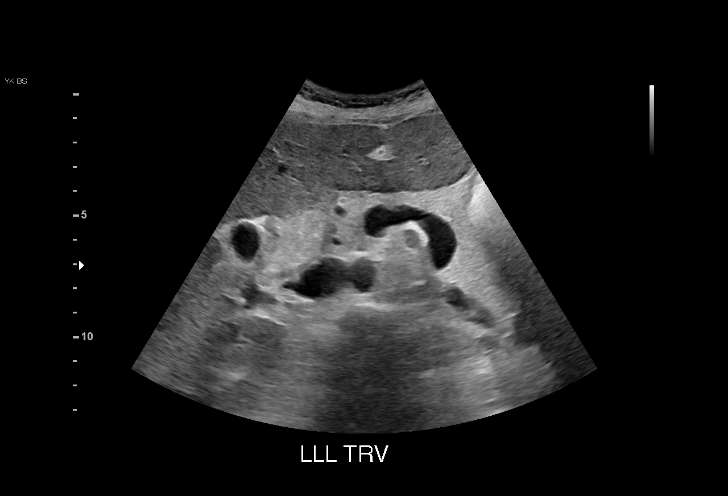
[im 7/41]
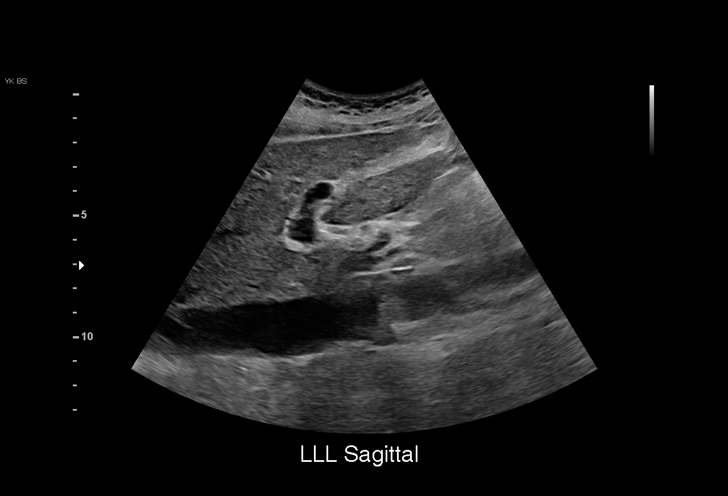
[im 9/41]
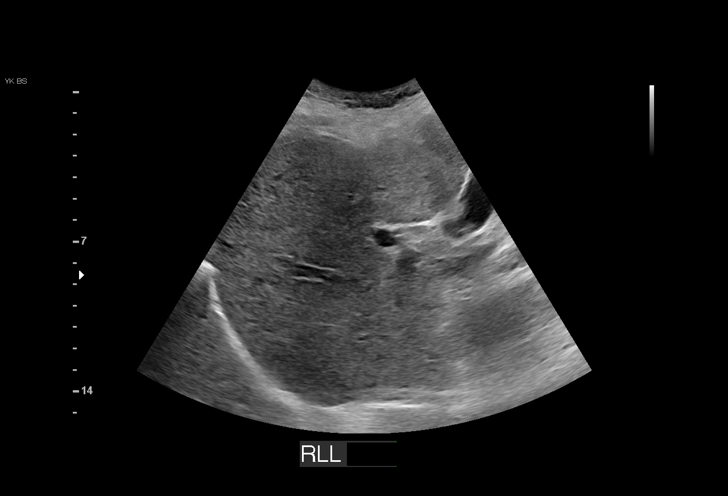
[im 12/41]
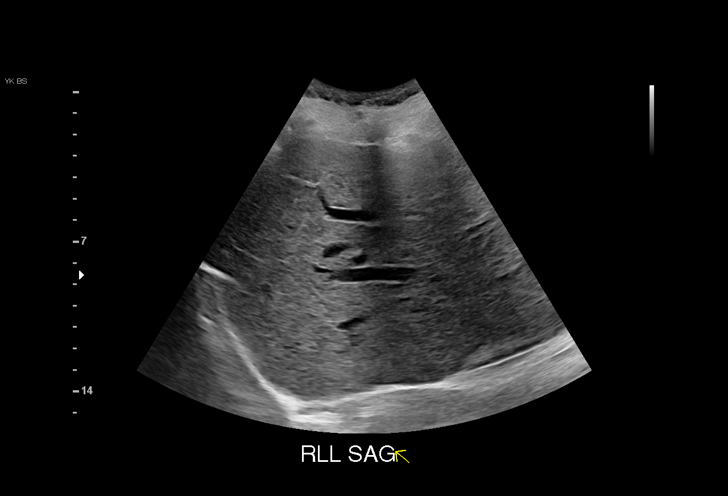
[im 16/41]
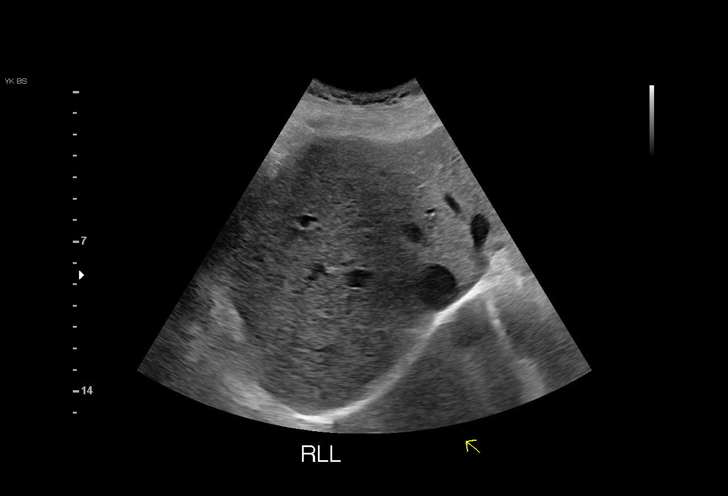
[im 17/41]
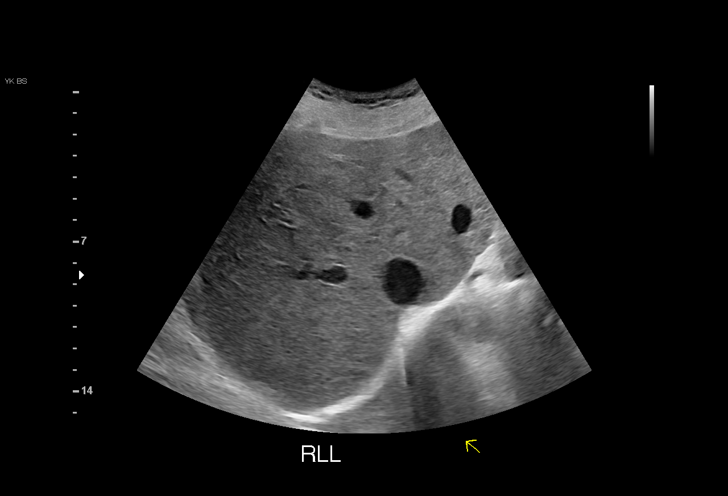
[im 21/41]
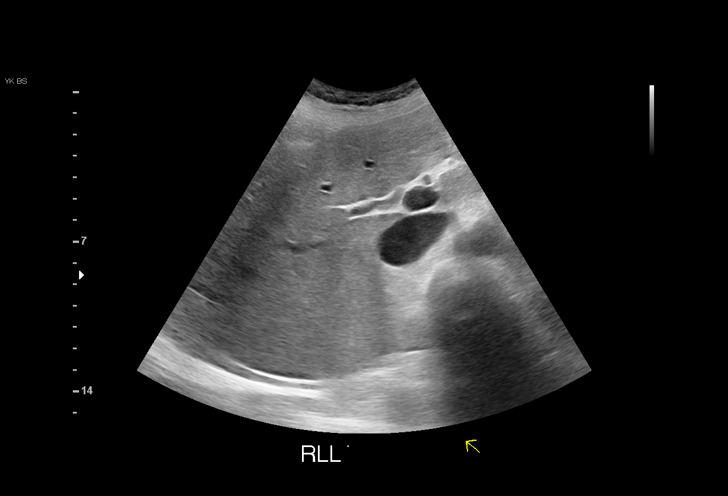
[im 24/41]
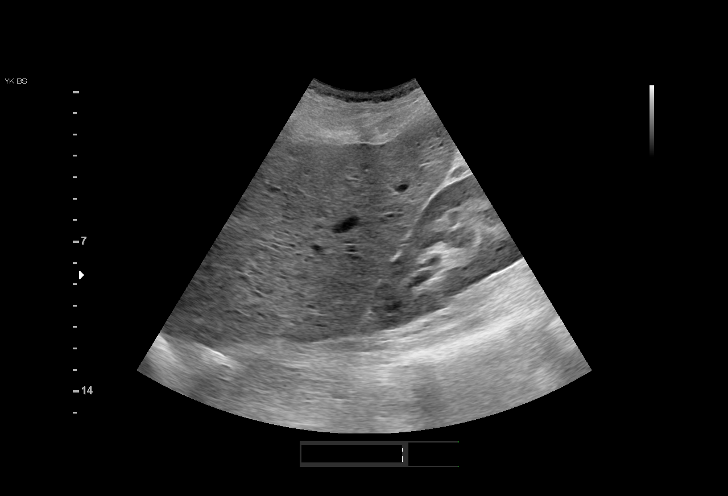
[im 26/41]
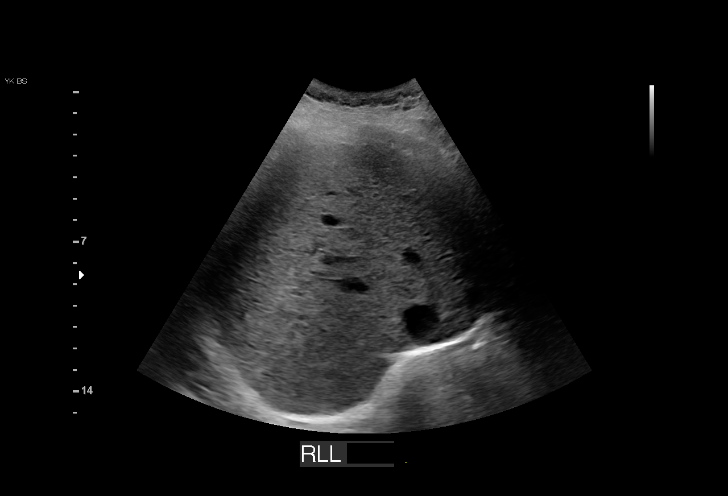
[im 29/41]
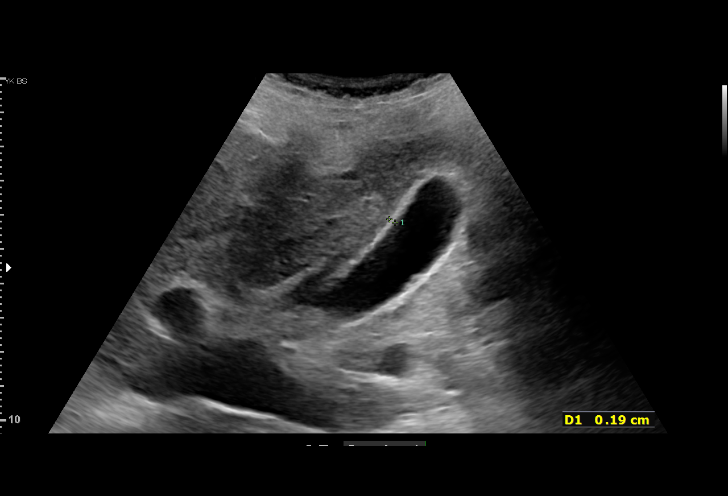
[im 32/41]
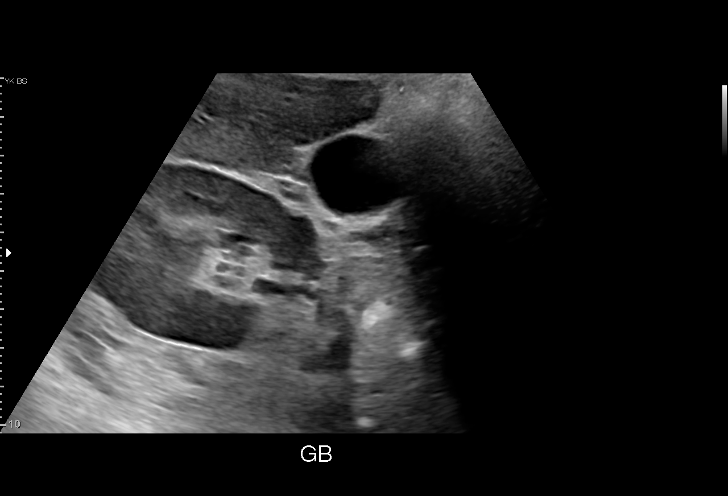
[im 34/41]
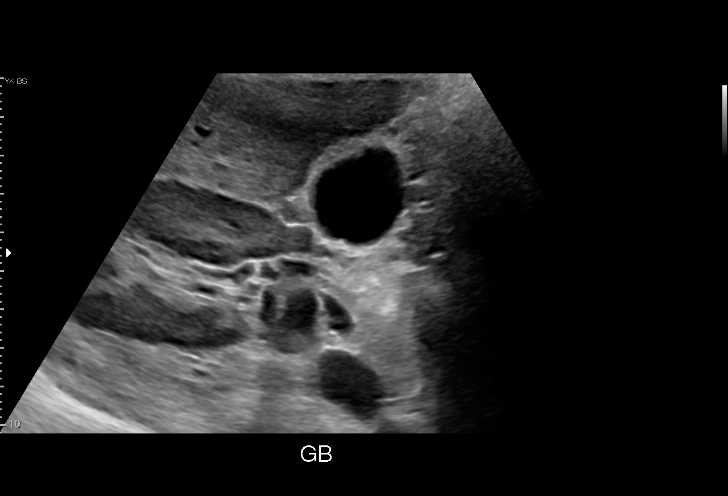
[im 37/41]
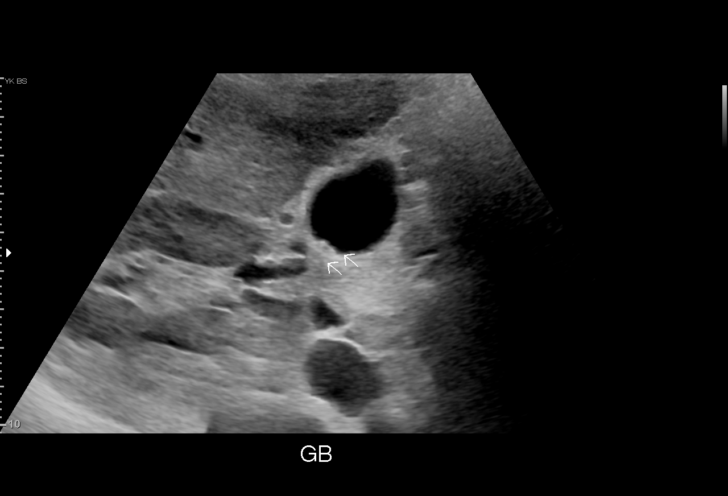
[im 41/41]
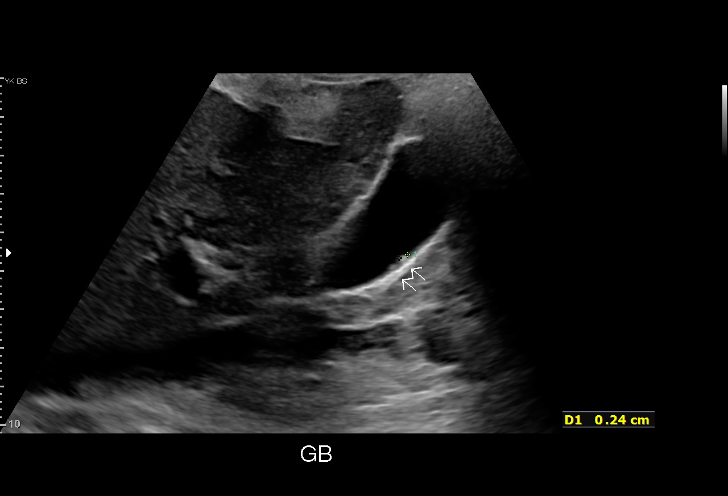

[15 of 25 positions shown; findings below may reference images not displayed]

FINDINGS: Gallbladder:

Normally distended. No wall thickening or pericholecystic fluid.
Minimal dependent nonshadowing density within gallbladder, favor
tiny nonshadowing calculi. No sonographic Murphy sign.

Common bile duct:

Diameter: 4 mm, normal

Liver:

Normal echogenicity without mass or nodularity. No intrahepatic
biliary dilatation. Portal vein is patent on color Doppler imaging
with normal direction of blood flow towards the liver.

Other: No RIGHT upper quadrant free fluid.
IMPRESSION: Probable tiny nonshadowing calculi within gallbladder.

Remainder of exam unremarkable.

## 2021-09-27 MED ORDER — OXYCODONE HCL 5 MG PO TABS
10.0000 mg | ORAL_TABLET | Freq: Four times a day (QID) | ORAL | Status: DC | PRN
  Administered 2021-09-27 (×2): 10 mg via ORAL
  Filled 2021-09-27 (×3): qty 2

## 2021-09-27 MED ORDER — SENNA 8.6 MG PO TABS
1.0000 | ORAL_TABLET | Freq: Every day | ORAL | Status: DC
  Administered 2021-09-27 – 2021-09-28 (×2): 8.6 mg via ORAL
  Filled 2021-09-27 (×2): qty 1

## 2021-09-27 MED ORDER — METOCLOPRAMIDE HCL 5 MG/ML IJ SOLN
10.0000 mg | Freq: Four times a day (QID) | INTRAMUSCULAR | Status: DC | PRN
  Administered 2021-09-27 – 2021-09-28 (×3): 10 mg via INTRAVENOUS
  Filled 2021-09-27 (×4): qty 2

## 2021-09-27 MED ORDER — OXYCODONE HCL 5 MG PO TABS
10.0000 mg | ORAL_TABLET | Freq: Four times a day (QID) | ORAL | Status: DC | PRN
  Administered 2021-09-27 – 2021-09-28 (×2): 10 mg via ORAL
  Filled 2021-09-27: qty 2

## 2021-09-27 MED ORDER — OXYCODONE HCL 5 MG PO TABS
10.0000 mg | ORAL_TABLET | ORAL | Status: DC | PRN
  Administered 2021-09-27: 15:00:00 10 mg via ORAL
  Filled 2021-09-27: qty 2

## 2021-09-27 MED ORDER — PROMETHAZINE HCL 25 MG PO TABS
25.0000 mg | ORAL_TABLET | Freq: Four times a day (QID) | ORAL | Status: DC | PRN
  Administered 2021-09-27 – 2021-09-28 (×2): 25 mg via ORAL
  Filled 2021-09-27 (×2): qty 1

## 2021-09-27 MED ORDER — DIPHENHYDRAMINE HCL 50 MG/ML IJ SOLN
25.0000 mg | Freq: Four times a day (QID) | INTRAMUSCULAR | Status: DC | PRN
  Administered 2021-09-27 – 2021-09-28 (×3): 25 mg via INTRAVENOUS
  Filled 2021-09-27 (×4): qty 1

## 2021-09-27 MED ORDER — DOCUSATE SODIUM 100 MG PO CAPS
100.0000 mg | ORAL_CAPSULE | Freq: Two times a day (BID) | ORAL | Status: DC
  Administered 2021-09-27 – 2021-09-28 (×3): 100 mg via ORAL
  Filled 2021-09-27 (×3): qty 1

## 2021-09-27 NOTE — Lactation Note (Addendum)
Lactation Consultation Note  Patient Name: Adriana Harrell Date: 09/27/2021 Reason for consult: Initial assessment;Mother's request;Term;Infant weight loss;Breastfeeding assistance Age:30 y.o. Fiorcet contains butalbital [place] L3  Mom states infant due today for a follow up weight check. Mom offering breast and Dad supplementing with formula 30-40 ml per feeding.  Mom pumping about 2x per day getting 10 ml per pumping session. LC reviewed with Mother post pumping schedule with DEBP q 3hrs for to maintain her milk supply.  Infant adequate urine and stool output. Since mothers admission, supplementation volume with formula increased to 59 ml per feeding.   Infant weight check appt on Monday.   Plan 1. To feed based on cues 8-12x 24hr period. Mom to offer breast observing signs of milk transfer.  2. Mom to supplement with EBM first followed by formula. BF Supplementation guide provided. Mom aware to offer more if he is not latching at the breast.  3. DEBP q 3hrs for 15 min   All questions answered at the end of the visit.   Maternal Data    Feeding Mother's Current Feeding Choice: Breast Milk and Formula  LATCH Score                    Lactation Tools Discussed/Used Tools: Pump;Flanges Flange Size: 21 Pump Education: Setup, frequency, and cleaning;Milk Storage Reason for Pumping: increase stimulation Pumping frequency: every 3 hrs for 15 min  Interventions Interventions: Breast feeding basics reviewed;Education;Pace feeding;LC Services brochure;Infant Driven Feeding Algorithm education;Breast compression;DEBP  Discharge Pump: Personal  Consult Status Consult Status: Follow-up Date: 09/28/21 Follow-up type: In-patient    Adriana Rease  Harrell 09/27/2021, 3:37 PM

## 2021-09-27 NOTE — Progress Notes (Addendum)
Progress Note  Admitted for postpartum preeclampsia. Postpartum Day 5 s/p repeat Cesarean section.  Subjective:  Patient reports no overnight events. Her headache was minimally improved with fioricet.  Her RUQ pain is stable. She denies nausea, vomiting, distension.  She is passing gas, but has not had a BM in several days.  Objective: Blood pressure (!) 125/58, pulse 66, temperature 97.6 F (36.4 C), temperature source Oral, resp. rate 18, height $RemoveBe'5\' 2"'vNdahhAJY$  (1.575 m), weight 71.2 kg, SpO2 100 %, unknown if currently breastfeeding.  Physical Exam:  General: alert and no distress Lochia: appropriate Uterine Fundus: firm Abdomen: soft, nondistended.  RUQ tenderness with palpation. Incision: dressing in place DVT Evaluation: No evidence of DVT seen on physical exam. No hyperreflexia or clonus  Recent Labs    09/26/21 1946 09/27/21 0427  HGB 9.1* 10.1*  HCT 27.9* 31.4*    Assessment/Plan: Postpartum Preeclampsia Persistent headache. "Different" than with her pseudotumor cerebri. Will trial alternative medications today, phenergan ordered Patient known to neurology, will consult for input.  Discontinue mag after 24 hrs.  RUQ pain and mildly elevated LFTs.  AST/ALT 62/50 >> 64/57 this AM.  Mildly elevated alk phos. If RUQ pain persists will consider RUQ ultrasound. Postoperative state - passing flatus, no nausea, tolerating meals, but is due for BM. Exam is not consistent with ileus or acute process. Continue colace, will schedule BID. Encouraged ambulation.    LOS: 0 days   Carlyon Shadow 09/27/2021, 7:15 AM

## 2021-09-27 NOTE — Progress Notes (Addendum)
Neurology Attending Attestation   I examined the patient and discussed plan with Ms. Adriana Blocker NP. Note below has been edited by me to reflect my findings and recommendations with the following additions/exceptions.  [name] with a history of intractable migraine and concern for possible pseudotumor cerebri who is admitted to the hospital with intractable headache and preeclampsia.  She is now postpartum day 5.  Her current headache started yesterday in the afternoon and has essentially been a 7 out of 10 in severity since that time.  It is left frontal and persistent, not stabbing, and is not associated with any of the following: neurologic deficits, visual disturbance, nausea vomiting, photophobia, or phonophobia. She took her BP multiple times at home prior to hospital arrival and reports SBP was in the 170s at home. SBP has been <140 on every measurement since admission. She has received prn fioricet, phenergan, and oxycodone with inadequate relief. She is on Mg++ gtt. She has a mild transaminitis and has been diagnosed with preeclampsia.  She has a longstanding history of migraine headaches however in the last several years they have been much more tolerable.  She very few headaches in her pregnancy overall until September of this year when she was admitted for intractable headache.  That headache felt different than the current headache because the headache in Sept was pulsatile and holocephalic.  At that time she will underwent MRI brain which was unremarkable and MRV which did not show any evidence of venous sinus thrombosis.  She underwent LP which showed an opening pressure of 24.  There was some concern at that time about whether or not her symptoms could be related to pseudotumor cerebri and she has been followed by Dr. Krista Harrell for the past few months as an outpatient. OP of 24 is borderline high, particularly with weight changes in pregnancy, and pseudotumor was considered a possible  but not definitive dx. She was seen by optho who found no e/o papilledema or visual field deficits.  Patient reports that her current headache feels similar to the migraine headaches that she used to get years ago especially around her menstrual cycle. It is very common for estrogen shifts postpartum to trigger migraine in patients with this headache history. Given the severity, poor responsive to treatment, and occurrence in the setting of postpartum preeclampsia, imaging is warranted to rule out potentially dangerous causes (PRES, RCVS, DVST). I am not convinced that she has pseudotumor (OP 24 is borderline esp in pregnancy, no e/o papilledema or visual field deficits), although if workup is negative and headache remains intractable, repeat LP may be considered at that time to clarify the dx.  Workup - MRI brain without contrast r/o PRES - MRA head without contrast r/o vascular etiology - MRV head r/o DVST  Pain control: - Hydration (PO or IV, avoid hypertension) - Metoclopramide 10mg  IV + benadryl 25mg  IV. May repeat once in 6 hrs if headache persists - If above cocktail not sufficient, consider 10mg  IV dexamethasone x1 if deemed safe in breastfeeding patient by OB - Given concern for possible RCVS as well as increased postpartum stroke risk in migraine patients, avoid triptans at this time  Preventive mgmt - Options for migraine prophylaxis in breastfeeding patient include labetalol or verapamil, botox, or nerve blocks. Patient may f/u post-discharge with her established outpatient provider.    Adriana Monks, MD [facility] [phone]   If 7pm- 7am, please page neurology on call as listed in [facility].  Neurology Consultation  Reason for Consult: Headache postpartum Referring Physician: Dr. Lorane Harrell  CC: new headache  History is obtained from:Patient and medical record  HPI: Adriana Harrell is a 30 y.o. female with past medical history of headache and pseudotumor  cerebri has been followed by Dr Adriana Harrell, who is postpartum day 5 who presented to the hospital for postpartum preeclampsia and persistent headache that is different than her normal headaches. She states the headache came on all of a sudden yesterday and has not resolved. She was hypertensive yesterday when the headache started. She describes it as a constant dull headache on left frontal side 7/10, and nothing has helped it. She has tried fioricet, oxy 10 and phenergan and nothing has helped subside the headache. She states that this headache is different than normal as her normal headaches are usually holocranial in nature. She denies N/V, or light sensitivity. She is currently receiving Mg gtt Neurology consulted for headache    ROS: Full ROS was performed and is negative except as noted in the HPI.   Past Medical History:  Diagnosis Date   Migraine     Family History  Problem Relation Age of Onset   Cancer Mother    Stroke Father     Social History:   reports that she has quit smoking. She has never used smokeless tobacco. She reports that she does not currently use alcohol. She reports that she does not currently use drugs.  Medications  Current Facility-Administered Medications:    butalbital-acetaminophen-caffeine (FIORICET) 50-325-40 MG per tablet 1 tablet, 1 tablet, Oral, Q6H PRN, Adriana Henri, MD, 1 tablet at 09/27/21 0541   docusate sodium (COLACE) capsule 100 mg, 100 mg, Oral, BID, Adriana Henri, MD, 100 mg at 09/27/21 0914   ferrous sulfate tablet 325 mg, 325 mg, Oral, Adriana Stabile, MD, 325 mg at 09/26/21 2141   labetalol (NORMODYNE) injection 20 mg, 20 mg, Intravenous, PRN **AND** labetalol (NORMODYNE) injection 40 mg, 40 mg, Intravenous, PRN **AND** labetalol (NORMODYNE) injection 80 mg, 80 mg, Intravenous, PRN **AND** hydrALAZINE (APRESOLINE) injection 10 mg, 10 mg, Intravenous, PRN **AND** [COMPLETED] Measure blood pressure, , , Once, Adriana Harrell, Adriana Garibaldi,  MD   lactated ringers infusion, , Intravenous, Continuous, Adriana Henri, MD, Last Rate: 75 mL/hr at 09/27/21 0542, New Bag at 09/27/21 0542   magnesium sulfate 40 grams in SWI 1000 mL OB infusion, 2 g/hr, Intravenous, Continuous, Adriana Henri, MD, Last Rate: 50 mL/hr at 09/26/21 2201, 2 g/hr at 09/26/21 2201   oxyCODONE (Oxy IR/ROXICODONE) immediate release tablet 10 mg, 10 mg, Oral, Q6H PRN, Adriana Henri, MD, 10 mg at 09/27/21 0745   promethazine (PHENERGAN) tablet 25 mg, 25 mg, Oral, Q6H PRN, Adriana Henri, MD, 25 mg at 09/27/21 0914   simethicone (MYLICON) chewable tablet 80 mg, 80 mg, Oral, Q6H PRN, Adriana Henri, MD, 80 mg at 09/26/21 2333   Exam: Current vital signs: BP (!) 120/59 (BP Location: Right Arm)   Pulse 84   Temp (!) 97.5 F (36.4 C) (Oral)   Resp 18   Ht 5\' 2"  (1.575 m)   Wt 71.2 kg   SpO2 100%   BMI 28.72 kg/m  Vital signs in last 24 hours: Temp:  [96.9 F (36.1 C)-98.3 F (36.8 C)] 97.5 F (36.4 C) (11/27 0952) Pulse Rate:  [53-84] 84 (11/27 0952) Resp:  [17-18] 18 (11/27 1000) BP: (103-139)/(58-83) 120/59 (11/27 0952) SpO2:  [99 %-100 %] 100 % (11/27 0800) Weight:  [  71.2 kg] 71.2 kg (11/26 1928)  GENERAL: Awake, alert in NAD. She is feeding baby so exam was slightly limited HEENT: - Normocephalic and atraumatic, dry mm LUNGS - Clear to auscultation bilaterally with no wheezes CV - S1S2 RRR, no m/r/g, equal pulses bilaterally. ABDOMEN - Soft, nontender, nondistended with normoactive BS Ext: warm, well perfused, intact peripheral pulses, no edema  NEURO:  Mental Status: AA&Ox3  Language: speech is clear  Naming, repetition, fluency, and comprehension intact. Cranial Nerves: PERRL 2 mm/brisk. EOMI, visual fields full, no facial asymmetry, facial sensation intact, hearing intact, tongue/uvula/soft palate midline, normal sternocleidomastoid and trapezius muscle strength. No evidence of tongue atrophy or fibrillations Motor: 5/5 in  all 4 extremities Tone: is normal and bulk is normal Sensation- Intact to light touch bilaterally Coordination: FTN intact bilaterally, no ataxia in BLE. Gait- deferred    Imaging I have reviewed the images obtained:  MRI examination of the brain MR brain 07/09/21 no acute abnormality   MRV 07/09/21  Negative for venous sinus thrombosis  Assessment:   Adriana Harrell is a 30 y.o. female with past medical history of headache and pseudotumor cerebri has been followed by Dr Adriana Harrell, who is postpartum day 5 who presented to the hospital for postpartum preeclampsia and persistent headache that is different than her normal headaches  Impression: Acute onset of left frontal headache in the setting of preeclampsia  Recommendations: - check MRI/MRV brain  - maintain normotension  -Follow up with Dr. Krista Harrell as outpatient  Beulah Gandy, NP

## 2021-09-28 DIAGNOSIS — O1490 Unspecified pre-eclampsia, unspecified trimester: Secondary | ICD-10-CM | POA: Diagnosis not present

## 2021-09-28 LAB — CBC
HCT: 30.8 % — ABNORMAL LOW (ref 36.0–46.0)
Hemoglobin: 9.8 g/dL — ABNORMAL LOW (ref 12.0–15.0)
MCH: 26.1 pg (ref 26.0–34.0)
MCHC: 31.8 g/dL (ref 30.0–36.0)
MCV: 81.9 fL (ref 80.0–100.0)
Platelets: 354 10*3/uL (ref 150–400)
RBC: 3.76 MIL/uL — ABNORMAL LOW (ref 3.87–5.11)
RDW: 15.7 % — ABNORMAL HIGH (ref 11.5–15.5)
WBC: 10.5 10*3/uL (ref 4.0–10.5)
nRBC: 0 % (ref 0.0–0.2)

## 2021-09-28 LAB — COMPREHENSIVE METABOLIC PANEL
ALT: 46 U/L — ABNORMAL HIGH (ref 0–44)
AST: 37 U/L (ref 15–41)
Albumin: 2.5 g/dL — ABNORMAL LOW (ref 3.5–5.0)
Alkaline Phosphatase: 155 U/L — ABNORMAL HIGH (ref 38–126)
Anion gap: 8 (ref 5–15)
BUN: 11 mg/dL (ref 6–20)
CO2: 25 mmol/L (ref 22–32)
Calcium: 7.2 mg/dL — ABNORMAL LOW (ref 8.9–10.3)
Chloride: 102 mmol/L (ref 98–111)
Creatinine, Ser: 0.67 mg/dL (ref 0.44–1.00)
GFR, Estimated: >60 mL/min (ref >60)
Glucose, Bld: 87 mg/dL (ref 70–99)
Potassium: 3.8 mmol/L (ref 3.5–5.1)
Sodium: 135 mmol/L (ref 135–145)
Total Bilirubin: 0.4 mg/dL (ref 0.3–1.2)
Total Protein: 5.9 g/dL — ABNORMAL LOW (ref 6.5–8.1)

## 2021-09-28 LAB — LACTATE DEHYDROGENASE: LDH: 225 U/L — ABNORMAL HIGH (ref 98–192)

## 2021-09-28 LAB — URIC ACID: Uric Acid, Serum: 6.2 mg/dL (ref 2.5–7.1)

## 2021-09-28 MED ORDER — MAGNESIUM HYDROXIDE 400 MG/5ML PO SUSP
30.0000 mL | Freq: Every day | ORAL | Status: DC
  Administered 2021-09-28: 09:00:00 30 mL via ORAL
  Filled 2021-09-28: qty 30

## 2021-09-28 NOTE — Progress Notes (Signed)
PPD #6  No HA at this moment Feels much better than yesterday Passing flatus, minimal BM>uncomfortable  Today's Vitals   09/28/21 0050 09/28/21 0345 09/28/21 0435 09/28/21 0549  BP:   116/66   Pulse:   72   Resp:   18   Temp:      TempSrc:      SpO2:   99%   Weight:      Height:      PainSc: 5  Asleep 5  Asleep   Body mass index is 28.72 kg/m.   NAD FFNT Incision healing well  Results for orders placed or performed during the hospital encounter of 09/26/21 (from the past 24 hour(s))  CBC     Status: Abnormal   Collection Time: 09/28/21  4:06 AM  Result Value Ref Range   WBC 10.5 4.0 - 10.5 K/uL   RBC 3.76 (L) 3.87 - 5.11 MIL/uL   Hemoglobin 9.8 (L) 12.0 - 15.0 g/dL   HCT 82.4 (L) 23.5 - 36.1 %   MCV 81.9 80.0 - 100.0 fL   MCH 26.1 26.0 - 34.0 pg   MCHC 31.8 30.0 - 36.0 g/dL   RDW 44.3 (H) 15.4 - 00.8 %   Platelets 354 150 - 400 K/uL   nRBC 0.0 0.0 - 0.2 %  Comprehensive metabolic panel     Status: Abnormal   Collection Time: 09/28/21  4:06 AM  Result Value Ref Range   Sodium 135 135 - 145 mmol/L   Potassium 3.8 3.5 - 5.1 mmol/L   Chloride 102 98 - 111 mmol/L   CO2 25 22 - 32 mmol/L   Glucose, Bld 87 70 - 99 mg/dL   BUN 11 6 - 20 mg/dL   Creatinine, Ser 6.76 0.44 - 1.00 mg/dL   Calcium 7.2 (L) 8.9 - 10.3 mg/dL   Total Protein 5.9 (L) 6.5 - 8.1 g/dL   Albumin 2.5 (L) 3.5 - 5.0 g/dL   AST 37 15 - 41 U/L   ALT 46 (H) 0 - 44 U/L   Alkaline Phosphatase 155 (H) 38 - 126 U/L   Total Bilirubin 0.4 0.3 - 1.2 mg/dL   GFR, Estimated >19 >50 mL/min   Anion gap 8 5 - 15  Lactate dehydrogenase     Status: Abnormal   Collection Time: 09/28/21  4:06 AM  Result Value Ref Range   LDH 225 (H) 98 - 192 U/L  Uric acid     Status: None   Collection Time: 09/28/21  4:06 AM  Result Value Ref Range   Uric Acid, Serum 6.2 2.5 - 7.1 mg/dL    A/P: PP preeclampsia-magnesium infusion now off                                    -labs-transaminitis resolving                                     -RUQ U/S normal except possible nonshadowing tiny calculus in gallbladder                                    -BP OK          HA-none at this moment              -  await neuro FU today>discharge planning per their recommendation         Constipation-MOM

## 2021-09-28 NOTE — Plan of Care (Signed)
°  Problem: Education: °Goal: Knowledge of General Education information will improve °Description: Including pain rating scale, medication(s)/side effects and non-pharmacologic comfort measures °Outcome: Adequate for Discharge °  °Problem: Health Behavior/Discharge Planning: °Goal: Ability to manage health-related needs will improve °Outcome: Adequate for Discharge °  °Problem: Clinical Measurements: °Goal: Ability to maintain clinical measurements within normal limits will improve °Outcome: Adequate for Discharge °Goal: Will remain free from infection °Outcome: Adequate for Discharge °Goal: Diagnostic test results will improve °Outcome: Adequate for Discharge °Goal: Respiratory complications will improve °Outcome: Adequate for Discharge °Goal: Cardiovascular complication will be avoided °Outcome: Adequate for Discharge °  °Problem: Activity: °Goal: Risk for activity intolerance will decrease °Outcome: Adequate for Discharge °  °Problem: Nutrition: °Goal: Adequate nutrition will be maintained °Outcome: Adequate for Discharge °  °Problem: Coping: °Goal: Level of anxiety will decrease °Outcome: Adequate for Discharge °  °Problem: Elimination: °Goal: Will not experience complications related to bowel motility °Outcome: Adequate for Discharge °Goal: Will not experience complications related to urinary retention °Outcome: Adequate for Discharge °  °Problem: Pain Managment: °Goal: General experience of comfort will improve °Outcome: Adequate for Discharge °  °Problem: Safety: °Goal: Ability to remain free from injury will improve °Outcome: Adequate for Discharge °  °Problem: Skin Integrity: °Goal: Risk for impaired skin integrity will decrease °Outcome: Adequate for Discharge °  °Problem: Education: °Goal: Knowledge of disease or condition will improve °Outcome: Adequate for Discharge °Goal: Knowledge of the prescribed therapeutic regimen will improve °Outcome: Adequate for Discharge °  °Problem: Fluid Volume: °Goal:  Peripheral tissue perfusion will improve °Outcome: Adequate for Discharge °  °Problem: Clinical Measurements: °Goal: Complications related to disease process, condition or treatment will be avoided or minimized °Outcome: Adequate for Discharge °  °Problem: Education: °Goal: Knowledge of condition will improve °Outcome: Adequate for Discharge °Goal: Individualized Educational Video(s) °Outcome: Adequate for Discharge °Goal: Individualized Newborn Educational Video(s) °Outcome: Adequate for Discharge °  °Problem: Activity: °Goal: Will verbalize the importance of balancing activity with adequate rest periods °Outcome: Adequate for Discharge °Goal: Ability to tolerate increased activity will improve °Outcome: Adequate for Discharge °  °Problem: Coping: °Goal: Ability to identify and utilize available resources and services will improve °Outcome: Adequate for Discharge °  °Problem: Life Cycle: °Goal: Chance of risk for complications during the postpartum period will decrease °Outcome: Adequate for Discharge °  °Problem: Role Relationship: °Goal: Ability to demonstrate positive interaction with newborn will improve °Outcome: Adequate for Discharge °  °Problem: Skin Integrity: °Goal: Demonstration of wound healing without infection will improve °Outcome: Adequate for Discharge °  °

## 2021-09-28 NOTE — Discharge Summary (Signed)
Physician Discharge Summary  Patient ID: Adriana Harrell MRN: [id number] DOB/AGE: Dec 23, 1990 30 y.o.  Admit date: 09/26/2021 Discharge date: 09/28/2021  Admission Diagnoses:post partum preeclampsia  Discharge Diagnoses:  Principal Problem:   Preeclampsia Headache  Discharged Condition: good  Hospital Course: Admitted PPD #4 following repeat C/S and BTL with discharge POD #3 doing well. She returns to hospital with sys BP at home 170s, RUQ pain and HA. Pregnancy complicated by Hx of HA. She was admitted during pregnancy with HA at which time neuro evaluated>LP suspicious for pseudotumor cerebri. HA on readmission was different in character than pseudotumor cerebri HA. On admission LFTs are mildly elevated in the 50s-60s. She is placed on magnesium sulfate for seizure prophylaxis and BPs remain normotensive. RUQ U/S is essentially normal. Neuro is consulted and imaging studies of brain are normal. HA resolves with Benadryl and Reglan. LFTs normalize and on day of discharge she is HA free.  Consults: neurology  Significant Diagnostic Studies: labs:  Results for orders placed or performed during the hospital encounter of 09/26/21 (from the past 24 hour(s))  CBC     Status: Abnormal   Collection Time: 09/28/21  4:06 AM  Result Value Ref Range   WBC 10.5 4.0 - 10.5 K/uL   RBC 3.76 (L) 3.87 - 5.11 MIL/uL   Hemoglobin 9.8 (L) 12.0 - 15.0 g/dL   HCT 35.0 (L) 09.3 - 81.8 %   MCV 81.9 80.0 - 100.0 fL   MCH 26.1 26.0 - 34.0 pg   MCHC 31.8 30.0 - 36.0 g/dL   RDW 29.9 (H) 37.1 - 69.6 %   Platelets 354 150 - 400 K/uL   nRBC 0.0 0.0 - 0.2 %  Comprehensive metabolic panel     Status: Abnormal   Collection Time: 09/28/21  4:06 AM  Result Value Ref Range   Sodium 135 135 - 145 mmol/L   Potassium 3.8 3.5 - 5.1 mmol/L   Chloride 102 98 - 111 mmol/L   CO2 25 22 - 32 mmol/L   Glucose, Bld 87 70 - 99 mg/dL   BUN 11 6 - 20 mg/dL   Creatinine, Ser 7.89 0.44 - 1.00 mg/dL   Calcium 7.2 (L) 8.9 - 10.3  mg/dL   Total Protein 5.9 (L) 6.5 - 8.1 g/dL   Albumin 2.5 (L) 3.5 - 5.0 g/dL   AST 37 15 - 41 U/L   ALT 46 (H) 0 - 44 U/L   Alkaline Phosphatase 155 (H) 38 - 126 U/L   Total Bilirubin 0.4 0.3 - 1.2 mg/dL   GFR, Estimated >38 >10 mL/min   Anion gap 8 5 - 15  Lactate dehydrogenase     Status: Abnormal   Collection Time: 09/28/21  4:06 AM  Result Value Ref Range   LDH 225 (H) 98 - 192 U/L  Uric acid     Status: None   Collection Time: 09/28/21  4:06 AM  Result Value Ref Range   Uric Acid, Serum 6.2 2.5 - 7.1 mg/dL    and radiology: RUQ [place] and MRI  Treatments: IV hydration, analgesia: acetaminophen, and Benadryl and Reglan  Discharge Exam: Blood pressure 123/64, pulse 80, temperature (!) 97.5 F (36.4 C), temperature source Oral, resp. rate 18, height 5\' 2"  (1.575 m), weight 71.2 kg, SpO2 100 %, currently breastfeeding. General appearance: alert, cooperative, and no distress GI: soft, non-tender; bowel sounds normal; no masses,  no organomegaly Incision healing well  Disposition: Discharge disposition: 01-Home or Self Care  Allergies as of 09/28/2021       Reactions   Penicillins Hives   Tamiflu [oseltamivir Phosphate] Hives   Tamiflu [oseltamivir] Hives        Medication List     STOP taking these medications    butalbital-acetaminophen-caffeine 50-325-40 MG tablet Commonly known as: FIORICET   butorphanol 10 MG/ML nasal spray Commonly known as: STADOL   magnesium oxide 400 (240 Mg) MG tablet Commonly known as: MAG-OX   prenatal multivitamin Tabs tablet   promethazine 12.5 MG tablet Commonly known as: PHENERGAN       TAKE these medications    acetaminophen 325 MG tablet Commonly known as: TYLENOL Take 2 tablets (650 mg total) by mouth every 4 (four) hours as needed for mild pain (temperature > 101.5.).   ibuprofen 600 MG tablet Commonly known as: ADVIL Take 1 tablet (600 mg total) by mouth every 6 (six) hours.   oxyCODONE 5 MG  immediate release tablet Commonly known as: Roxicodone Take 1 tablet (5 mg total) by mouth every 4 (four) hours as needed for severe pain.        Follow-up Information     Levert Feinstein, MD Follow up in 1 month(s).   Specialty: Neurology Contact information: [address] [place] [address] [phone]                 Signed: Roselle Locus II 09/28/2021, 11:08 AM

## 2021-09-28 NOTE — Progress Notes (Signed)
Discharge information given, all questions answered, and patient and family verbalized understanding. Follow-up care, current medications patient already taking at home before admission, and when to call the MD discussed. Patient is alert and oriented x4, ambulatory, pain stable, and VS WDL. Patient will finish pumping and call out when ready to go.

## 2021-09-28 NOTE — Progress Notes (Addendum)
CC: new headache follow up.   History is obtained from:Patient and medical record  HPI: Adriana Harrell is a 30 y.o. female with past medical history of headache and pseudotumor cerebri has been followed by Dr Krista Blue, who is postpartum day 5 who presented to the hospital for postpartum preeclampsia and persistent headache that is different than her normal headaches. She states the headache came on all of a sudden yesterday and has not resolved. She was hypertensive yesterday when the headache started. She describes it as a constant dull headache on left frontal side 7/10, and nothing has helped it. She has tried fioricet, oxy 10 and phenergan and nothing has helped subside the headache. She states that this headache is different than normal as her normal headaches are usually holocranial in nature. She denies N/V, or light sensitivity. She is currently receiving Mg gtt  Neurology was consulted for headache   She was given migraine cocktail outlined below and she reports that headache has fully resolved. She is ready for discharge from obstetrical [facility].    ROS: Full ROS was performed and is negative except as noted in the HPI.   Past Medical History:  Diagnosis Date   Migraine     Family History  Problem Relation Age of Onset   Cancer Mother    Stroke Father     Social History:   reports that she has quit smoking. She has never used smokeless tobacco. She reports that she does not currently use alcohol. She reports that she does not currently use drugs.  Medications  Current Facility-Administered Medications:    butalbital-acetaminophen-caffeine (FIORICET) 50-325-40 MG per tablet 1 tablet, 1 tablet, Oral, Q6H PRN, Carlyon Shadow, MD, 1 tablet at 09/27/21 0541   diphenhydrAMINE (BENADRYL) injection 25 mg, 25 mg, Intravenous, Q6H PRN, Carlyon Shadow, MD, 25 mg at 09/28/21 0436   docusate sodium (COLACE) capsule 100 mg, 100 mg, Oral, BID, Carlyon Shadow, MD, 100  mg at 09/28/21 0933   ferrous sulfate tablet 325 mg, 325 mg, Oral, Illene Labrador, MD, 325 mg at 09/28/21 0933   labetalol (NORMODYNE) injection 20 mg, 20 mg, Intravenous, PRN **AND** labetalol (NORMODYNE) injection 40 mg, 40 mg, Intravenous, PRN **AND** labetalol (NORMODYNE) injection 80 mg, 80 mg, Intravenous, PRN **AND** hydrALAZINE (APRESOLINE) injection 10 mg, 10 mg, Intravenous, PRN **AND** [COMPLETED] Measure blood pressure, , , Once, Mardelle Matte, Michell Heinrich, MD   lactated ringers infusion, , Intravenous, Continuous, Carlyon Shadow, MD, Stopped at 09/27/21 2142   magnesium hydroxide (MILK OF MAGNESIA) suspension 30 mL, 30 mL, Oral, Daily, Everlene Farrier, MD, 30 mL at 09/28/21 0838   metoCLOPramide (REGLAN) injection 10 mg, 10 mg, Intravenous, Q6H PRN, Carlyon Shadow, MD, 10 mg at 09/28/21 0437   oxyCODONE (Oxy IR/ROXICODONE) immediate release tablet 10 mg, 10 mg, Oral, Q6H PRN, Carlyon Shadow, MD, 10 mg at 09/28/21 0756   promethazine (PHENERGAN) tablet 25 mg, 25 mg, Oral, Q6H PRN, Carlyon Shadow, MD, 25 mg at 09/28/21 0049   senna (SENOKOT) tablet 8.6 mg, 1 tablet, Oral, Daily, Eyvonne Mechanic A, MD, 8.6 mg at 09/28/21 0933   simethicone (MYLICON) chewable tablet 80 mg, 80 mg, Oral, Q6H PRN, Carlyon Shadow, MD, 80 mg at 09/26/21 2333   Exam: Current vital signs: BP 123/64 (BP Location: Right Arm)   Pulse 80   Temp (!) 97.5 F (36.4 C) (Oral)   Resp 18   Ht _0  (1.575 m)  Wt 71.2 kg   SpO2 100%   BMI 28.72 kg/m  Vital signs in last 24 hours: Temp:  [97.5 F (36.4 C)-98 F (36.7 C)] 97.5 F (36.4 C) (11/28 0811) Pulse Rate:  [68-83] 80 (11/28 0811) Resp:  [17-18] 18 (11/28 0811) BP: (116-127)/(64-81) 123/64 (11/28 0811) SpO2:  [99 %-100 %] 100 % (11/28 0811)  GENERAL: Awake, alert in NAD. She is feeding baby so exam was slightly limited HEENT: - Normocephalic and atraumatic, dry mm LUNGS - Clear to auscultation bilaterally with no wheezes CV -  S1S2 RRR, no m/r/g, equal pulses bilaterally. ABDOMEN - Soft, nontender, nondistended with normoactive BS Ext: warm, well perfused, intact peripheral pulses, no edema  NEURO: pt met in her room, family at bedside providing support. She is not in any distress. Ready for discharge to home.  Mental Status: AA&Ox3  Language: speech is clear  Naming, repetition, fluency, and comprehension intact. Cranial Nerves: PERRL 2 mm/brisk. EOMI, visual fields full, no facial asymmetry, facial sensation intact, hearing intact, tongue/uvula/soft palate midline, normal sternocleidomastoid and trapezius muscle strength. No evidence of tongue atrophy or fibrillations Motor: 5/5 in all 4 extremities Tone: is normal and bulk is normal Sensation- Intact to light touch bilaterally Coordination: FTN intact bilaterally, no ataxia in BLE. Gait- deferred    Imaging I have reviewed the images obtained:  MRI examination of the brain MR brain 07/09/21 no acute abnormality   MRV 07/09/21  Negative for venous sinus thrombosis  Assessment:  Adriana Harrell is a 30 y.o. female with past medical history of headache and possible pseudotumor cerebri versus falsely elevated CSF opening pressure on prior LP while pregnant, who has been followed by Dr Krista Blue, postpartum day 5, who presented to the hospital for postpartum preeclampsia and persistent headache that is different than her normal headaches  Impression: Acute onset of left frontal headache in the setting of preeclampsia  Patient was given migraine cocktail consisting of:  Pain control: - Hydration (PO or IV, avoid hypertension) - Metoclopramide 75m IV + benadryl 212mIV. May repeat once in 6 hrs if headache persists - If above cocktail not sufficient, consider 1039mV dexamethasone x1 if deemed safe in breastfeeding patient by OB - Given concern for possible RCVS as well as increased postpartum stroke risk in migraine patients, avoid triptans at this  time  Recommendations: - maintain normotension  -Follow up with Dr. YanKrista Blue outpatient Preventive mgmt - Options for migraine prophylaxis in breastfeeding patient include labetalol or verapamil, botox, or nerve blocks. Patient may f/u post-discharge with her established outpatient provider.  No further inpatient headache workup indicated and we will sign off at this time but remain available to assist.   Electronically signed: Dr. EriKerney Elbe

## 2021-09-29 ENCOUNTER — Inpatient Hospital Stay (HOSPITAL_COMMUNITY): Admit: 2021-09-29 | Payer: BC Managed Care – PPO

## 2021-09-29 DIAGNOSIS — Z01818 Encounter for other preprocedural examination: Secondary | ICD-10-CM

## 2021-10-06 ENCOUNTER — Telehealth (HOSPITAL_COMMUNITY): Payer: Self-pay | Admitting: *Deleted

## 2021-10-06 NOTE — Telephone Encounter (Signed)
Hospital Discharge Follow-Up Call:  Patient reports that she is doing well. She says there were "some hiccups in the beginning, but we are through those now".  She has no concerns about her healing process at this time.  EPDS today was 2 and she endorses this accurately reflects that she is doing well emotionally.  Patient says that baby is well and she has no concerns about baby's health.  She reports that baby sleeps in a bassinet beside her bed.  Reviewed ABCs of Safe Sleep.

## 2022-09-01 HISTORY — PX: GALLBLADDER SURGERY: SHX652

## 2022-09-07 ENCOUNTER — Other Ambulatory Visit: Payer: Self-pay | Admitting: General Surgery

## 2022-09-07 DIAGNOSIS — R1011 Right upper quadrant pain: Secondary | ICD-10-CM

## 2022-09-11 ENCOUNTER — Encounter (HOSPITAL_COMMUNITY): Payer: Self-pay

## 2022-09-11 ENCOUNTER — Emergency Department (HOSPITAL_COMMUNITY): Payer: BC Managed Care – PPO

## 2022-09-11 ENCOUNTER — Other Ambulatory Visit: Payer: Self-pay

## 2022-09-11 ENCOUNTER — Emergency Department (HOSPITAL_COMMUNITY)
Admission: EM | Admit: 2022-09-11 | Discharge: 2022-09-12 | Disposition: A | Discharge status: Home / Self Care | Payer: BC Managed Care – PPO | Attending: Emergency Medicine | Admitting: Emergency Medicine

## 2022-09-11 DIAGNOSIS — K828 Other specified diseases of gallbladder: Secondary | ICD-10-CM | POA: Diagnosis not present

## 2022-09-11 DIAGNOSIS — R109 Unspecified abdominal pain: Secondary | ICD-10-CM | POA: Diagnosis present

## 2022-09-11 LAB — URINALYSIS, ROUTINE W REFLEX MICROSCOPIC
Bilirubin Urine: NEGATIVE
Glucose, UA: NEGATIVE mg/dL
Hgb urine dipstick: NEGATIVE
Ketones, ur: NEGATIVE mg/dL
Leukocytes,Ua: NEGATIVE
Nitrite: NEGATIVE
Protein, ur: NEGATIVE mg/dL
Specific Gravity, Urine: 1.018 (ref 1.005–1.030)
pH: 6 (ref 5.0–8.0)

## 2022-09-11 LAB — COMPREHENSIVE METABOLIC PANEL
ALT: 14 U/L (ref 0–44)
AST: 19 U/L (ref 15–41)
Albumin: 4.3 g/dL (ref 3.5–5.0)
Alkaline Phosphatase: 98 U/L (ref 38–126)
Anion gap: 8 (ref 5–15)
BUN: 7 mg/dL (ref 6–20)
CO2: 24 mmol/L (ref 22–32)
Calcium: 9.4 mg/dL (ref 8.9–10.3)
Chloride: 108 mmol/L (ref 98–111)
Creatinine, Ser: 0.65 mg/dL (ref 0.44–1.00)
GFR, Estimated: >60 mL/min (ref >60)
Glucose, Bld: 100 mg/dL — ABNORMAL HIGH (ref 70–99)
Potassium: 4.3 mmol/L (ref 3.5–5.1)
Sodium: 140 mmol/L (ref 135–145)
Total Bilirubin: 0.4 mg/dL (ref 0.3–1.2)
Total Protein: 7.1 g/dL (ref 6.5–8.1)

## 2022-09-11 LAB — CBC WITH DIFFERENTIAL/PLATELET
Abs Immature Granulocytes: 0.03 10*3/uL (ref 0.00–0.07)
Basophils Absolute: 0.1 10*3/uL (ref 0.0–0.1)
Basophils Relative: 1 %
Eosinophils Absolute: 0.2 10*3/uL (ref 0.0–0.5)
Eosinophils Relative: 2 %
HCT: 38.9 % (ref 36.0–46.0)
Hemoglobin: 12.3 g/dL (ref 12.0–15.0)
Immature Granulocytes: 0 %
Lymphocytes Relative: 27 %
Lymphs Abs: 2.4 10*3/uL (ref 0.7–4.0)
MCH: 25.7 pg — ABNORMAL LOW (ref 26.0–34.0)
MCHC: 31.6 g/dL (ref 30.0–36.0)
MCV: 81.4 fL (ref 80.0–100.0)
Monocytes Absolute: 0.5 10*3/uL (ref 0.1–1.0)
Monocytes Relative: 6 %
Neutro Abs: 5.6 10*3/uL (ref 1.7–7.7)
Neutrophils Relative %: 64 %
Platelets: 365 10*3/uL (ref 150–400)
RBC: 4.78 MIL/uL (ref 3.87–5.11)
RDW: 13.8 % (ref 11.5–15.5)
WBC: 8.7 10*3/uL (ref 4.0–10.5)
nRBC: 0 % (ref 0.0–0.2)

## 2022-09-11 LAB — I-STAT BETA HCG BLOOD, ED (MC, WL, AP ONLY): I-stat hCG, quantitative: <5 m[IU]/mL (ref <5)

## 2022-09-11 LAB — LIPASE, BLOOD: Lipase: 36 U/L (ref 11–51)

## 2022-09-11 MED ORDER — ONDANSETRON 4 MG PO TBDP
4.0000 mg | ORAL_TABLET | Freq: Once | ORAL | Status: AC
  Administered 2022-09-11: 4 mg via ORAL
  Filled 2022-09-11: qty 1

## 2022-09-11 MED ORDER — HYDROCODONE-ACETAMINOPHEN 5-325 MG PO TABS
1.0000 | ORAL_TABLET | Freq: Once | ORAL | Status: AC
  Administered 2022-09-11: 1 via ORAL
  Filled 2022-09-11: qty 1

## 2022-09-11 NOTE — ED Triage Notes (Signed)
Pt arrived POV from home c/o RUQ abdominal pain that has been intermittent for a couple months but then came back yesterday, went awawy and then after she ate breakfast this morning came back again. Pt also endorses N/V.

## 2022-09-11 NOTE — ED Provider Triage Note (Signed)
Emergency Medicine Provider Triage Evaluation Note  Adriana Harrell , Harrell 31 y.o. female  was evaluated in triage.  Pt complains of right upper quadrant abdominal pain and epigastric pain.  Has been having intermittent pain over the last 2 months.  We seen by Dr. Andrey Campanile with general surgery.  Has outpatient ultrasound scheduled for 2 days however pain over the last 24 hours has increased.  Worse with eating and drinking.  Multiple episodes of NBNB emesis.  Some chills without documented fever.  Review of Systems  Positive: Abd pain, emesis Negative: fever  Physical Exam  There were no vitals taken for this visit. Gen:   Awake, no distress   Resp:  Normal effort  MSK:   Moves extremities without difficulty  ABD:  Tenderness right upper quadrant Other:    Medical Decision Making  Medically screening exam initiated at 11:43 AM.  Appropriate orders placed.  Adriana Harrell was informed that the remainder of the evaluation will be completed by another provider, this initial triage assessment does not replace that evaluation, and the importance of remaining in the ED until their evaluation is complete.  RUQ abd pain, emesis   Adriana Doren A, PA-C 09/11/22 1144

## 2022-09-12 MED ORDER — ONDANSETRON 4 MG PO TBDP
4.0000 mg | ORAL_TABLET | Freq: Once | ORAL | Status: AC
  Administered 2022-09-12: 4 mg via ORAL
  Filled 2022-09-12: qty 1

## 2022-09-12 MED ORDER — OXYCODONE-ACETAMINOPHEN 5-325 MG PO TABS
2.0000 | ORAL_TABLET | Freq: Once | ORAL | Status: AC
  Administered 2022-09-12: 2 via ORAL
  Filled 2022-09-12: qty 2

## 2022-09-12 MED ORDER — OXYCODONE-ACETAMINOPHEN 5-325 MG PO TABS: 1.0000 | ORAL_TABLET | Freq: Four times a day (QID) | ORAL | 0 refills | Status: DC | PRN

## 2022-09-12 MED ORDER — ONDANSETRON 4 MG PO TBDP: 4.0000 mg | ORAL_TABLET | Freq: Three times a day (TID) | ORAL | 0 refills | Status: DC | PRN

## 2022-09-12 NOTE — Discharge Instructions (Addendum)
Return if you run a fever or have worsening pain.

## 2022-09-12 NOTE — ED Provider Notes (Signed)
[facility] DEPT [facility] Emergency Department Provider Note MRN:  [id number]  Arrival date & time: 09/12/22     Chief Complaint   Abdominal Pain   History of Present Illness   Adriana Harrell is a 31 y.o. year-old female presents to the ED with chief complaint of abdominal pain.  Onset was about 2 months ago.  Has been gradually worsening.  Denies fevers.  Reports some associated nausea.  Worsened with eating.  History provided by patient.   Review of Systems  Pertinent positive and negative review of systems noted in HPI.    Physical Exam   Vitals:   09/11/22 1841 09/12/22 0211  BP: 130/87 109/83  Pulse: 77 68  Resp: 12 16  Temp: 98.2 F (36.8 C) 97.7 F (36.5 C)  SpO2: 99% 100%    CONSTITUTIONAL:  well-appearing, NAD NEURO:  Alert and oriented x 3, CN 3-12 grossly intact EYES:  eyes equal and reactive ENT/NECK:  Supple, no stridor  CARDIO:  normal rate, regular rhythm, appears well-perfused  PULM:  No respiratory distress, CTAB GI/GU:  non-distended, RUQ TTP MSK/SPINE:  No gross deformities, no edema, moves all extremities  SKIN:  no rash, atraumatic   *Additional and/or pertinent findings included in MDM below  Diagnostic and Interventional Summary     Labs Reviewed  CBC WITH DIFFERENTIAL/PLATELET - Abnormal; Notable for the following components:      Result Value   MCH 25.7 (*)    All other components within normal limits  COMPREHENSIVE METABOLIC PANEL - Abnormal; Notable for the following components:   Glucose, Bld 100 (*)    All other components within normal limits  LIPASE, BLOOD  URINALYSIS, ROUTINE W REFLEX MICROSCOPIC  I-STAT BETA HCG BLOOD, ED ([facility], [facility], [facility] ONLY)    US Abdomen Limited RUQ (LIVER/GB)  Final Result      Medications  ondansetron (ZOFRAN-ODT) disintegrating tablet 4 mg (4 mg Oral Given 09/11/22 1148)  HYDROcodone-acetaminophen (NORCO/VICODIN) 5-325 MG per tablet 1 tablet (1 tablet Oral Given 09/11/22 1148)   oxyCODONE-acetaminophen (PERCOCET/ROXICET) 5-325 MG per tablet 2 tablet (2 tablets Oral Given 09/12/22 0240)  ondansetron (ZOFRAN-ODT) disintegrating tablet 4 mg (4 mg Oral Given 09/12/22 0241)     Procedures  /  Critical Care Procedures  ED Course and Medical Decision Making  I have reviewed the triage vital signs, the nursing notes, and pertinent available records from the EMR.  Social Determinants Affecting Complexity of Care: Patient has no clinically significant social determinants affecting this chief complaint..   ED Course:    Medical Decision Making Patient here with right upper abdominal pain.  Has been having intermittent symptoms for the past couple of months.  Has outpatient ultrasound scheduled for next week.  States symptoms worsened over the past 24 hours.  Work-up ordered in triage notable for gallbladder sludge, but without cholelithiasis or evidence of cholecystitis.  No leukocytosis.  No evidence of biliary obstruction.  Patient's pain is much improved after Percocet.  Problems Addressed: Gallbladder sludge: acute illness or injury  Risk Prescription drug management.     Consultants: No consultations were needed in caring for this patient.   Treatment and Plan: I considered admission due to patient's initial presentation, but after considering the examination and diagnostic results, patient will not require admission and can be discharged with outpatient follow-up.    Final Clinical Impressions(s) / ED Diagnoses     ICD-10-CM   1. Gallbladder sludge  K82.8       ED Discharge  Orders          Ordered    oxyCODONE-acetaminophen (PERCOCET) 5-325 MG tablet  Every 6 hours PRN        09/12/22 0341    ondansetron (ZOFRAN-ODT) 4 MG disintegrating tablet  Every 8 hours PRN        09/12/22 0341              Discharge Instructions Discussed with and Provided to Patient:    Discharge Instructions      Return if you run a fever or have  worsening pain.      Roxy Horseman, PA-C 09/12/22 5859    Zadie Rhine, MD 09/12/22 [phone]

## 2022-09-13 ENCOUNTER — Other Ambulatory Visit: Payer: Self-pay

## 2022-09-13 ENCOUNTER — Observation Stay
Admission: EM | Admit: 2022-09-13 | Discharge: 2022-09-15 | Disposition: A | Discharge status: Home / Self Care | Payer: BC Managed Care – PPO | Attending: General Surgery | Admitting: General Surgery

## 2022-09-13 ENCOUNTER — Emergency Department: Payer: BC Managed Care – PPO

## 2022-09-13 ENCOUNTER — Other Ambulatory Visit: Payer: BC Managed Care – PPO

## 2022-09-13 DIAGNOSIS — K839 Disease of biliary tract, unspecified: Secondary | ICD-10-CM | POA: Insufficient documentation

## 2022-09-13 DIAGNOSIS — Z87891 Personal history of nicotine dependence: Secondary | ICD-10-CM | POA: Diagnosis not present

## 2022-09-13 DIAGNOSIS — K8012 Calculus of gallbladder with acute and chronic cholecystitis without obstruction: Secondary | ICD-10-CM | POA: Diagnosis not present

## 2022-09-13 DIAGNOSIS — R1011 Right upper quadrant pain: Secondary | ICD-10-CM | POA: Diagnosis present

## 2022-09-13 DIAGNOSIS — K838 Other specified diseases of biliary tract: Secondary | ICD-10-CM

## 2022-09-13 DIAGNOSIS — K810 Acute cholecystitis: Secondary | ICD-10-CM | POA: Diagnosis present

## 2022-09-13 LAB — CBC WITH DIFFERENTIAL/PLATELET
Abs Immature Granulocytes: 0.03 10*3/uL (ref 0.00–0.07)
Basophils Absolute: 0.1 10*3/uL (ref 0.0–0.1)
Basophils Relative: 1 %
Eosinophils Absolute: 0.2 10*3/uL (ref 0.0–0.5)
Eosinophils Relative: 3 %
HCT: 36.7 % (ref 36.0–46.0)
Hemoglobin: 11.9 g/dL — ABNORMAL LOW (ref 12.0–15.0)
Immature Granulocytes: 0 %
Lymphocytes Relative: 35 %
Lymphs Abs: 3.3 10*3/uL (ref 0.7–4.0)
MCH: 25.5 pg — ABNORMAL LOW (ref 26.0–34.0)
MCHC: 32.4 g/dL (ref 30.0–36.0)
MCV: 78.8 fL — ABNORMAL LOW (ref 80.0–100.0)
Monocytes Absolute: 0.7 10*3/uL (ref 0.1–1.0)
Monocytes Relative: 7 %
Neutro Abs: 5 10*3/uL (ref 1.7–7.7)
Neutrophils Relative %: 54 %
Platelets: 368 10*3/uL (ref 150–400)
RBC: 4.66 MIL/uL (ref 3.87–5.11)
RDW: 13.7 % (ref 11.5–15.5)
WBC: 9.3 10*3/uL (ref 4.0–10.5)
nRBC: 0 % (ref 0.0–0.2)

## 2022-09-13 LAB — URINALYSIS, ROUTINE W REFLEX MICROSCOPIC
Bilirubin Urine: NEGATIVE
Glucose, UA: NEGATIVE mg/dL
Hgb urine dipstick: NEGATIVE
Ketones, ur: NEGATIVE mg/dL
Leukocytes,Ua: NEGATIVE
Nitrite: NEGATIVE
Protein, ur: NEGATIVE mg/dL
Specific Gravity, Urine: 1.018 (ref 1.005–1.030)
pH: 5 (ref 5.0–8.0)

## 2022-09-13 LAB — COMPREHENSIVE METABOLIC PANEL
ALT: 12 U/L (ref 0–44)
AST: 21 U/L (ref 15–41)
Albumin: 4.5 g/dL (ref 3.5–5.0)
Alkaline Phosphatase: 99 U/L (ref 38–126)
Anion gap: 8 (ref 5–15)
BUN: 11 mg/dL (ref 6–20)
CO2: 24 mmol/L (ref 22–32)
Calcium: 9 mg/dL (ref 8.9–10.3)
Chloride: 105 mmol/L (ref 98–111)
Creatinine, Ser: 0.67 mg/dL (ref 0.44–1.00)
GFR, Estimated: >60 mL/min (ref >60)
Glucose, Bld: 97 mg/dL (ref 70–99)
Potassium: 3.8 mmol/L (ref 3.5–5.1)
Sodium: 137 mmol/L (ref 135–145)
Total Bilirubin: 0.6 mg/dL (ref 0.3–1.2)
Total Protein: 7.8 g/dL (ref 6.5–8.1)

## 2022-09-13 LAB — LIPASE, BLOOD: Lipase: 41 U/L (ref 11–51)

## 2022-09-13 LAB — POC URINE PREG, ED: Preg Test, Ur: NEGATIVE

## 2022-09-13 MED ORDER — ACETAMINOPHEN 650 MG RE SUPP: 650.0000 mg | Freq: Four times a day (QID) | RECTAL | Status: DC | PRN

## 2022-09-13 MED ORDER — HYDROCODONE-ACETAMINOPHEN 5-325 MG PO TABS
1.0000 | ORAL_TABLET | ORAL | Status: DC | PRN
  Administered 2022-09-13 – 2022-09-15 (×3): 1 via ORAL
  Administered 2022-09-15: 2 via ORAL
  Filled 2022-09-13 (×2): qty 1
  Filled 2022-09-13: qty 2
  Filled 2022-09-13: qty 1

## 2022-09-13 MED ORDER — ALUM & MAG HYDROXIDE-SIMETH 200-200-20 MG/5ML PO SUSP
30.0000 mL | Freq: Once | ORAL | Status: AC
  Administered 2022-09-13: 30 mL via ORAL
  Filled 2022-09-13: qty 30

## 2022-09-13 MED ORDER — ONDANSETRON HCL 4 MG/2ML IJ SOLN
4.0000 mg | Freq: Once | INTRAMUSCULAR | Status: AC
  Administered 2022-09-13: 4 mg via INTRAVENOUS
  Filled 2022-09-13: qty 2

## 2022-09-13 MED ORDER — ACETAMINOPHEN 325 MG PO TABS: 650.0000 mg | ORAL_TABLET | Freq: Four times a day (QID) | ORAL | Status: DC | PRN

## 2022-09-13 MED ORDER — SODIUM CHLORIDE 0.9 % IV BOLUS
1000.0000 mL | Freq: Once | INTRAVENOUS | Status: AC
  Administered 2022-09-13: 1000 mL via INTRAVENOUS

## 2022-09-13 MED ORDER — MORPHINE SULFATE (PF) 4 MG/ML IV SOLN
4.0000 mg | INTRAVENOUS | Status: DC | PRN
  Administered 2022-09-14 (×5): 4 mg via INTRAVENOUS
  Filled 2022-09-13 (×5): qty 1

## 2022-09-13 MED ORDER — PANTOPRAZOLE SODIUM 40 MG IV SOLR
40.0000 mg | Freq: Once | INTRAVENOUS | Status: AC
  Administered 2022-09-13: 40 mg via INTRAVENOUS
  Filled 2022-09-13: qty 10

## 2022-09-13 MED ORDER — METRONIDAZOLE 500 MG/100ML IV SOLN
500.0000 mg | Freq: Three times a day (TID) | INTRAVENOUS | Status: DC
  Administered 2022-09-14 – 2022-09-15 (×4): 500 mg via INTRAVENOUS
  Filled 2022-09-13 (×6): qty 100

## 2022-09-13 MED ORDER — ONDANSETRON HCL 4 MG/2ML IJ SOLN
4.0000 mg | Freq: Four times a day (QID) | INTRAMUSCULAR | Status: DC | PRN
  Administered 2022-09-14 (×3): 4 mg via INTRAVENOUS
  Filled 2022-09-13 (×3): qty 2

## 2022-09-13 MED ORDER — SODIUM CHLORIDE 0.9 % IV SOLN: INTRAVENOUS | Status: DC

## 2022-09-13 MED ORDER — ONDANSETRON 4 MG PO TBDP: 4.0000 mg | ORAL_TABLET | Freq: Four times a day (QID) | ORAL | Status: DC | PRN

## 2022-09-13 MED ORDER — ACETAMINOPHEN 500 MG PO TABS
1000.0000 mg | ORAL_TABLET | Freq: Once | ORAL | Status: AC
  Administered 2022-09-13: 1000 mg via ORAL
  Filled 2022-09-13: qty 2

## 2022-09-13 MED ORDER — MORPHINE SULFATE (PF) 4 MG/ML IV SOLN
4.0000 mg | Freq: Once | INTRAVENOUS | Status: AC
  Administered 2022-09-13: 4 mg via INTRAVENOUS
  Filled 2022-09-13: qty 1

## 2022-09-13 MED ORDER — CIPROFLOXACIN IN D5W 400 MG/200ML IV SOLN
400.0000 mg | Freq: Two times a day (BID) | INTRAVENOUS | Status: DC
  Administered 2022-09-13 – 2022-09-15 (×4): 400 mg via INTRAVENOUS
  Filled 2022-09-13 (×4): qty 200

## 2022-09-13 MED ORDER — IOHEXOL 300 MG/ML  SOLN
100.0000 mL | Freq: Once | INTRAMUSCULAR | Status: AC | PRN
  Administered 2022-09-13: 100 mL via INTRAVENOUS

## 2022-09-13 MED ORDER — ENOXAPARIN SODIUM 40 MG/0.4ML IJ SOSY
40.0000 mg | PREFILLED_SYRINGE | INTRAMUSCULAR | Status: DC
  Administered 2022-09-15: 40 mg via SUBCUTANEOUS
  Filled 2022-09-13: qty 0.4

## 2022-09-13 NOTE — ED Triage Notes (Signed)
Pt presents via POV c/o RUQ abd pain since Friday. Reports seen at [facility] with dx of "gallbladder sludge" Reports pain is persistently. Surgeon told her to report to ED for ongoing symptoms.

## 2022-09-13 NOTE — ED Provider Notes (Signed)
[facility] Provider Note    Event Date/Time   First MD Initiated Contact with Patient 09/13/22 2045     (approximate)   History   Abdominal Pain   HPI  Adriana Harrell is a 31 y.o. female with known biliary sludge who comes in with concerns for abdominal pain.  Patient reports 48 hours of constant upper abdominal pain.  She was told that she has biliary sludge on ultrasound 2 days ago on 11/11 when she was seen by [facility] and was told that if she was having continued pain that they can have the surgeon come see her and have it removed or follow-up outpatient.  Given her long wait in the emergency room she decided to leave and has been at home taking Percocet but still having significant pain nausea, vomiting not really tolerating eating or drinking.  Physical Exam   Triage Vital Signs: ED Triage Vitals  Enc Vitals Group     BP 09/13/22 1945 (!) 155/67     Pulse Rate 09/13/22 1945 (!) 112     Resp 09/13/22 1945 16     Temp 09/13/22 1945 98.1 F (36.7 C)     Temp Source 09/13/22 1945 Oral     SpO2 09/13/22 1945 100 %     Weight 09/13/22 1946 130 lb 1.1 oz (59 kg)     Height --      Head Circumference --      Peak Flow --      Pain Score 09/13/22 1946 10     Pain Loc --      Pain Edu? --      Excl. in GC? --     Most recent vital signs: Vitals:   09/13/22 1945 09/13/22 2322  BP: (!) 155/67 118/78  Pulse: (!) 112 75  Resp: 16 17  Temp: 98.1 F (36.7 C) 98.1 F (36.7 C)  SpO2: 100% 100%     General: Awake, no distress.  CV:  Good peripheral perfusion.  Resp:  Normal effort.  Abd:  No distention.  Tender in the right upper abdomen Other:     ED Results / Procedures / Treatments   Labs (all labs ordered are listed, but only abnormal results are displayed) Labs Reviewed  CBC WITH DIFFERENTIAL/PLATELET - Abnormal; Notable for the following components:      Result Value   Hemoglobin 11.9 (*)    MCV 78.8 (*)    MCH 25.5 (*)    All  other components within normal limits  URINALYSIS, ROUTINE W REFLEX MICROSCOPIC - Abnormal; Notable for the following components:   Color, Urine YELLOW (*)    APPearance HAZY (*)    All other components within normal limits  COMPREHENSIVE METABOLIC PANEL  LIPASE, BLOOD  HIV ANTIBODY (ROUTINE TESTING W REFLEX)  POC URINE PREG, ED      RADIOLOGY I have reviewed the CT personally and interpreted no evidence of any free air  PROCEDURES:  Critical Care performed: No  Procedures   MEDICATIONS ORDERED IN ED: Medications  enoxaparin (LOVENOX) injection 40 mg (has no administration in time range)  0.9 %  sodium chloride infusion ( Intravenous New Bag/Given 09/13/22 2319)  ciprofloxacin (CIPRO) IVPB 400 mg (400 mg Intravenous New Bag/Given 09/13/22 2311)  metroNIDAZOLE (FLAGYL) IVPB 500 mg (has no administration in time range)  acetaminophen (TYLENOL) tablet 650 mg (has no administration in time range)    Or  acetaminophen (TYLENOL) suppository 650 mg (has no administration  in time range)  HYDROcodone-acetaminophen (NORCO/VICODIN) 5-325 MG per tablet 1-2 tablet (1 tablet Oral Given 09/13/22 2313)  morphine (PF) 4 MG/ML injection 4 mg (has no administration in time range)  ondansetron (ZOFRAN-ODT) disintegrating tablet 4 mg (has no administration in time range)    Or  ondansetron (ZOFRAN) injection 4 mg (has no administration in time range)  ondansetron (ZOFRAN) injection 4 mg (4 mg Intravenous Given 09/13/22 2017)  morphine (PF) 4 MG/ML injection 4 mg (4 mg Intravenous Given 09/13/22 2019)  iohexol (OMNIPAQUE) 300 MG/ML solution 100 mL (100 mLs Intravenous Contrast Given 09/13/22 2045)  acetaminophen (TYLENOL) tablet 1,000 mg (1,000 mg Oral Given 09/13/22 2222)  alum & mag hydroxide-simeth (MAALOX/MYLANTA) 200-200-20 MG/5ML suspension 30 mL (30 mLs Oral Given 09/13/22 2223)  pantoprazole (PROTONIX) injection 40 mg (40 mg Intravenous Given 09/13/22 2223)  ondansetron (ZOFRAN)  injection 4 mg (4 mg Intravenous Given 09/13/22 2222)  sodium chloride 0.9 % bolus 1,000 mL (0 mLs Intravenous Stopped 09/13/22 2308)     IMPRESSION / MDM / ASSESSMENT AND PLAN / ED COURSE  I reviewed the triage vital signs and the nursing notes.   Patient's presentation is most consistent with acute presentation with potential threat to life or bodily function.   Differential includes ulcers, gastritis, gallbladder pathology.  Patient given some IV fluids IV morphine from triage.  CT ordered from triage that was negative.  Gallbladder on that looks normal but ultrasound from 2 days ago showed gallbladder sludge  Pregnancy test was negative.  Lipase normal CMP normal CBC shows stable hemoglobin normal white count.  Patient given some medications to her to help with gastritis.  Patient still having significant discomfort discussed with Dr. Maia Plan and he will admit patient for surgery tomorrow     FINAL CLINICAL IMPRESSION(S) / ED DIAGNOSES   Final diagnoses:  Biliary sludge     Rx / DC Orders   ED Discharge Orders     None        Note:  This document was prepared using Dragon voice recognition software and may include unintentional dictation errors.   Concha Se, MD 09/13/22 2340

## 2022-09-13 NOTE — ED Notes (Signed)
Pt in CT at this time.  CT to bring to [facility] per [place].

## 2022-09-13 NOTE — ED Provider Triage Note (Signed)
Emergency Medicine Provider Triage Evaluation Note  Adriana Harrell , a 31 y.o. female  was evaluated in triage.  Pt complains of RUQ pain. Saw [facility] 2 days ago with biliary sludge. Outpatient referral to surgery. Can't keep food or liquids down. Worsening pain.  Review of Systems  Positive: RUQ, N/V Negative: Diarreha, fever  Physical Exam  BP (!) 155/67   Pulse (!) 112   Temp 98.1 F (36.7 C) (Oral)   Resp 16   Wt 59 kg   LMP 09/06/2022 (Approximate)   SpO2 100%   Breastfeeding No   BMI 23.79 kg/m  Gen:   Awake, no distress   Resp:  Normal effort  MSK:   Moves extremities without difficulty  Other:  RUQ TTP with guarding  Medical Decision Making  Medically screening exam initiated at 7:50 PM.  Appropriate orders placed.  Adriana Harrell was informed that the remainder of the evaluation will be completed by another provider, this initial triage assessment does not replace that evaluation, and the importance of remaining in the ED until their evaluation is complete.  Labs, urinalysis, CT abd/pel   Racheal Patches, PA-C 09/13/22 1950

## 2022-09-14 ENCOUNTER — Encounter
Admission: EM | Disposition: A | Discharge status: Home / Self Care | Payer: Self-pay | Source: Home / Self Care | Attending: Emergency Medicine

## 2022-09-14 ENCOUNTER — Other Ambulatory Visit: Payer: Self-pay

## 2022-09-14 ENCOUNTER — Encounter: Payer: Self-pay | Admitting: General Surgery

## 2022-09-14 ENCOUNTER — Observation Stay: Payer: BC Managed Care – PPO | Admitting: Certified Registered"

## 2022-09-14 LAB — HIV ANTIBODY (ROUTINE TESTING W REFLEX): HIV Screen 4th Generation wRfx: NONREACTIVE

## 2022-09-14 SURGERY — CHOLECYSTECTOMY, ROBOT-ASSISTED, LAPAROSCOPIC
Anesthesia: General

## 2022-09-14 MED ORDER — ROCURONIUM BROMIDE 100 MG/10ML IV SOLN
INTRAVENOUS | Status: DC | PRN
  Administered 2022-09-14: 50 mg via INTRAVENOUS

## 2022-09-14 MED ORDER — FENTANYL CITRATE (PF) 100 MCG/2ML IJ SOLN
INTRAMUSCULAR | Status: AC
  Filled 2022-09-14: qty 2

## 2022-09-14 MED ORDER — MIDAZOLAM HCL 2 MG/2ML IJ SOLN
INTRAMUSCULAR | Status: DC | PRN
  Administered 2022-09-14: 2 mg via INTRAVENOUS

## 2022-09-14 MED ORDER — BUPIVACAINE-EPINEPHRINE (PF) 0.25% -1:200000 IJ SOLN
INTRAMUSCULAR | Status: AC
  Filled 2022-09-14: qty 30

## 2022-09-14 MED ORDER — MIDAZOLAM HCL 2 MG/2ML IJ SOLN
INTRAMUSCULAR | Status: AC
  Filled 2022-09-14: qty 2

## 2022-09-14 MED ORDER — KETOROLAC TROMETHAMINE 30 MG/ML IJ SOLN
INTRAMUSCULAR | Status: DC | PRN
  Administered 2022-09-14: 30 mg via INTRAVENOUS

## 2022-09-14 MED ORDER — FENTANYL CITRATE (PF) 100 MCG/2ML IJ SOLN
25.0000 ug | INTRAMUSCULAR | Status: DC | PRN
  Administered 2022-09-14 (×2): 25 ug via INTRAVENOUS

## 2022-09-14 MED ORDER — SUGAMMADEX SODIUM 200 MG/2ML IV SOLN
INTRAVENOUS | Status: DC | PRN
  Administered 2022-09-14: 200 mg via INTRAVENOUS

## 2022-09-14 MED ORDER — OXYCODONE HCL 5 MG/5ML PO SOLN: 5.0000 mg | Freq: Once | ORAL | Status: AC | PRN

## 2022-09-14 MED ORDER — OXYCODONE HCL 5 MG PO TABS: 5.0000 mg | ORAL_TABLET | Freq: Once | ORAL | Status: AC | PRN

## 2022-09-14 MED ORDER — OXYCODONE HCL 5 MG PO TABS
ORAL_TABLET | ORAL | Status: AC
  Administered 2022-09-14: 5 mg via ORAL
  Filled 2022-09-14: qty 1

## 2022-09-14 MED ORDER — ONDANSETRON HCL 4 MG/2ML IJ SOLN
INTRAMUSCULAR | Status: DC | PRN
  Administered 2022-09-14: 4 mg via INTRAVENOUS

## 2022-09-14 MED ORDER — MORPHINE SULFATE (PF) 2 MG/ML IV SOLN: 1.0000 mg | Freq: Once | INTRAVENOUS | Status: AC

## 2022-09-14 MED ORDER — FENTANYL CITRATE (PF) 100 MCG/2ML IJ SOLN
INTRAMUSCULAR | Status: AC
  Administered 2022-09-14: 50 ug via INTRAVENOUS
  Filled 2022-09-14: qty 2

## 2022-09-14 MED ORDER — MORPHINE SULFATE (PF) 2 MG/ML IV SOLN
INTRAVENOUS | Status: AC
  Administered 2022-09-14: 1 mg via INTRAVENOUS
  Filled 2022-09-14: qty 1

## 2022-09-14 MED ORDER — LIDOCAINE HCL (CARDIAC) PF 100 MG/5ML IV SOSY
PREFILLED_SYRINGE | INTRAVENOUS | Status: DC | PRN
  Administered 2022-09-14: 50 mg via INTRAVENOUS

## 2022-09-14 MED ORDER — PROPOFOL 10 MG/ML IV BOLUS
INTRAVENOUS | Status: DC | PRN
  Administered 2022-09-14: 150 mg via INTRAVENOUS

## 2022-09-14 MED ORDER — FENTANYL CITRATE (PF) 100 MCG/2ML IJ SOLN
INTRAMUSCULAR | Status: DC | PRN
  Administered 2022-09-14 (×2): 50 ug via INTRAVENOUS

## 2022-09-14 MED ORDER — DEXAMETHASONE SODIUM PHOSPHATE 10 MG/ML IJ SOLN
INTRAMUSCULAR | Status: DC | PRN
  Administered 2022-09-14: 10 mg via INTRAVENOUS

## 2022-09-14 MED ORDER — INDOCYANINE GREEN 25 MG IV SOLR
1.2500 mg | Freq: Once | INTRAVENOUS | Status: AC
  Administered 2022-09-14: 1.25 mg via INTRAVENOUS
  Filled 2022-09-14: qty 0.5

## 2022-09-14 MED ORDER — LACTATED RINGERS IV SOLN: INTRAVENOUS | Status: DC | PRN

## 2022-09-14 MED ORDER — BUPIVACAINE-EPINEPHRINE 0.25% -1:200000 IJ SOLN
INTRAMUSCULAR | Status: DC | PRN
  Administered 2022-09-14: 20 mL

## 2022-09-14 SURGICAL SUPPLY — 53 items
BAG PRESSURE INF REUSE 1000 (BAG) IMPLANT
BLADE SURG SZ11 CARB STEEL (BLADE) ×1 IMPLANT
CANNULA REDUC XI 12-8 STAPL (CANNULA) ×1
CANNULA REDUCER 12-8 DVNC XI (CANNULA) ×1 IMPLANT
CATH REDDICK CHOLANGI 4FR 50CM (CATHETERS) IMPLANT
CLIP LIGATING HEM O LOK PURPLE (MISCELLANEOUS) IMPLANT
CLIP LIGATING HEMO O LOK GREEN (MISCELLANEOUS) ×1 IMPLANT
DERMABOND ADVANCED .7 DNX12 (GAUZE/BANDAGES/DRESSINGS) ×1 IMPLANT
DRAPE ARM DVNC X/XI (DISPOSABLE) ×4 IMPLANT
DRAPE C-ARM XRAY 36X54 (DRAPES) IMPLANT
DRAPE COLUMN DVNC XI (DISPOSABLE) ×1 IMPLANT
DRAPE DA VINCI XI ARM (DISPOSABLE) ×4
DRAPE DA VINCI XI COLUMN (DISPOSABLE) ×1
ELECT REM PT RETURN 9FT ADLT (ELECTROSURGICAL) ×1
ELECTRODE REM PT RTRN 9FT ADLT (ELECTROSURGICAL) ×1 IMPLANT
GLOVE BIO SURGEON STRL SZ 6.5 (GLOVE) ×2 IMPLANT
GLOVE BIOGEL PI IND STRL 6.5 (GLOVE) ×2 IMPLANT
GOWN STRL REUS W/ TWL LRG LVL3 (GOWN DISPOSABLE) ×3 IMPLANT
GOWN STRL REUS W/TWL LRG LVL3 (GOWN DISPOSABLE) ×3
GRASPER SUT TROCAR 14GX15 (MISCELLANEOUS) ×1 IMPLANT
IRRIGATOR SUCT 8 DISP DVNC XI (IRRIGATION / IRRIGATOR) IMPLANT
IRRIGATOR SUCTION 8MM XI DISP (IRRIGATION / IRRIGATOR)
IV CATH ANGIO 12GX3 LT BLUE (NEEDLE) IMPLANT
IV NS 1000ML (IV SOLUTION)
IV NS 1000ML BAXH (IV SOLUTION) IMPLANT
KIT PINK PAD W/HEAD ARE REST (MISCELLANEOUS) ×1 IMPLANT
KIT PINK PAD W/HEAD ARM REST (MISCELLANEOUS) ×1 IMPLANT
LABEL OR SOLS (LABEL) ×1 IMPLANT
MANIFOLD NEPTUNE II (INSTRUMENTS) ×1 IMPLANT
NDL INSUFFLATION 14GA 120MM (NEEDLE) ×1 IMPLANT
NEEDLE HYPO 22GX1.5 SAFETY (NEEDLE) ×1 IMPLANT
NEEDLE INSUFFLATION 14GA 120MM (NEEDLE) ×1 IMPLANT
NS IRRIG 500ML POUR BTL (IV SOLUTION) ×1 IMPLANT
OBTURATOR OPTICAL STANDARD 8MM (TROCAR) ×1
OBTURATOR OPTICAL STND 8 DVNC (TROCAR) ×1
OBTURATOR OPTICALSTD 8 DVNC (TROCAR) ×1 IMPLANT
PACK LAP CHOLECYSTECTOMY (MISCELLANEOUS) ×1 IMPLANT
SEAL CANN UNIV 5-8 DVNC XI (MISCELLANEOUS) ×3 IMPLANT
SEAL XI 5MM-8MM UNIVERSAL (MISCELLANEOUS) ×3
SET TUBE SMOKE EVAC HIGH FLOW (TUBING) ×1 IMPLANT
SOLUTION ELECTROLUBE (MISCELLANEOUS) ×1 IMPLANT
SPIKE FLUID TRANSFER (MISCELLANEOUS) ×2 IMPLANT
SPONGE T-LAP 4X18 ~~LOC~~+RFID (SPONGE) IMPLANT
STAPLER CANNULA SEAL DVNC XI (STAPLE) ×1 IMPLANT
STAPLER CANNULA SEAL XI (STAPLE) ×1
SUT MNCRL 4-0 (SUTURE) ×1
SUT MNCRL 4-0 27XMFL (SUTURE) ×1
SUT VICRYL 0 UR6 27IN ABS (SUTURE) ×1 IMPLANT
SUTURE MNCRL 4-0 27XMF (SUTURE) ×1 IMPLANT
SYS BAG RETRIEVAL 10MM (BASKET) ×1
SYSTEM BAG RETRIEVAL 10MM (BASKET) ×1 IMPLANT
TRAP FLUID SMOKE EVACUATOR (MISCELLANEOUS) ×1 IMPLANT
WATER STERILE IRR 500ML POUR (IV SOLUTION) ×1 IMPLANT

## 2022-09-14 NOTE — Transfer of Care (Signed)
Immediate Anesthesia Transfer of Care Note  Patient: Adriana Harrell  Procedure(s) Performed: XI ROBOTIC ASSISTED LAPAROSCOPIC CHOLECYSTECTOMY INDOCYANINE GREEN FLUORESCENCE IMAGING (ICG)  Patient Location: PACU  Anesthesia Type:General  Level of Consciousness: awake  Airway & Oxygen Therapy: Patient Spontanous Breathing and Patient connected to face mask oxygen  Post-op Assessment: Report given to RN and Post -op Vital signs reviewed and stable  Post vital signs: Reviewed and stable  Last Vitals:  Vitals Value Taken Time  BP 133/73 09/14/22 1445  Temp 36.2 C 09/14/22 1441  Pulse 71 09/14/22 1452  Resp 13 09/14/22 1452  SpO2 100 % 09/14/22 1452  Vitals shown include unvalidated device data.  Last Pain:  Vitals:   09/14/22 1441  TempSrc:   PainSc: Asleep      Patients Stated Pain Goal: 0 (09/14/22 1232)  Complications: No notable events documented.

## 2022-09-14 NOTE — Anesthesia Postprocedure Evaluation (Signed)
Anesthesia Post Note  Patient: Adriana Harrell  Procedure(s) Performed: XI ROBOTIC ASSISTED LAPAROSCOPIC CHOLECYSTECTOMY INDOCYANINE GREEN FLUORESCENCE IMAGING (ICG)  Patient location during evaluation: PACU Anesthesia Type: General Level of consciousness: awake and alert Pain management: pain level controlled Vital Signs Assessment: post-procedure vital signs reviewed and stable Respiratory status: spontaneous breathing, nonlabored ventilation, respiratory function stable and patient connected to nasal cannula oxygen Cardiovascular status: blood pressure returned to baseline and stable Postop Assessment: no apparent nausea or vomiting Anesthetic complications: no  No notable events documented.   Last Vitals:  Vitals:   09/14/22 1441 09/14/22 1445  BP: 138/68 133/73  Pulse: 62 (!) 53  Resp: 17 17  Temp: (!) 36.2 C   SpO2: 99% 98%    Last Pain:  Vitals:   09/14/22 1454  TempSrc:   PainSc: 8                  Stephanie Coup

## 2022-09-14 NOTE — Op Note (Signed)
Preoperative diagnosis: Acute cholecystitis  Postoperative diagnosis: Acute cholecystitis  Procedure: Robotic Assisted Laparoscopic Cholecystectomy.   Anesthesia: GETA   Surgeon: Dr. Hazle Quant  Wound Classification: Clean Contaminated  Indications: Patient is a 31 y.o. female developed right upper quadrant pain, nausea and on workup was found to have cholelithiasis (sludge) with a normal common duct. Robotic Assisted Laparoscopic cholecystectomy was elected.  Findings:  Critical view of safety achieved Cystic duct and artery identified, ligated and divided Adequate hemostasis  Description of procedure: The patient was placed on the operating table in the supine position. General anesthesia was induced. A time-out was completed verifying correct patient, procedure, site, positioning, and implant(s) and/or special equipment prior to beginning this procedure. An orogastric tube was placed. The abdomen was prepped and draped in the usual sterile fashion.  An incision was made in a natural skin line below the umbilicus.  The fascia was elevated and the Veress needle inserted. Proper position was confirmed by aspiration and saline meniscus test.  The abdomen was insufflated with carbon dioxide to a pressure of 15 mmHg. The patient tolerated insufflation well. A 8-mm trocar was then inserted in optiview fashion.  The laparoscope was inserted and the abdomen inspected. No injuries from initial trocar placement were noted. Additional trocars were then inserted in the following locations: an 8-mm trocar in the left lateral abdomen, and another two 8-mm trocars to the right side of the abdomen 5 cm appart. The umbilical trocar was changed to a 12 mm trocar all under direct visualization. The abdomen was inspected and no abnormalities were found. The table was placed in the reverse Trendelenburg position with the right side up. The robotic arms were docked and target anatomy identified. Instrument  inserted under direct visualization.  Filmy adhesions between the gallbladder and omentum, duodenum and transverse colon were lysed with electrocautery. The dome of the gallbladder was grasped with a prograsp and retracted over the dome of the liver. The infundibulum was also grasped with an atraumatic grasper and retracted toward the right lower quadrant. This maneuver exposed Calot's triangle. The peritoneum overlying the gallbladder infundibulum was then incised and the cystic duct and cystic artery identified and circumferentially dissected. Critical view of safety reviewed before ligating any structure. Firefly images taken to visualize biliary ducts. The cystic duct and cystic artery were then doubly clipped and divided close to the gallbladder.  The gallbladder was then dissected from its peritoneal attachments by electrocautery. Hemostasis was checked and the gallbladder and contained stones were removed using an endoscopic retrieval bag. The gallbladder was passed off the table as a specimen. There was no evidence of bleeding from the gallbladder fossa or cystic artery or leakage of the bile from the cystic duct stump. Secondary trocars were removed under direct vision. No bleeding was noted. The robotic arms were undoked. The scope was withdrawn and the umbilical trocar removed. The abdomen was allowed to collapse. The fascia of the 20mm trocar sites was closed with figure-of-eight 0 vicryl sutures. The skin was closed with subcuticular sutures of 4-0 monocryl and topical skin adhesive. The orogastric tube was removed.  The patient tolerated the procedure well and was taken to the postanesthesia care unit in stable condition.   Specimen: Gallbladder  Complications: None  EBL: 3 mL

## 2022-09-14 NOTE — H&P (Signed)
SURGICAL HISTORY AND PHYSICAL EXAM NOTE   HISTORY OF PRESENT ILLNESS (HPI):  31 y.o. female presented to [facility] ED for evaluation of abdominal pain. Patient reports she has been having on and off abdominal pain since 2 weeks ago.  She endorses that the pain is in the right upper quadrant.  Pain radiates to her back.  She endorses food can be an aggravating factor but sometimes pain can come without any trigger.  No elevating factor.  Patient has received multiple pain medications since she got to the ED without significant improvement of the pain.  Denies any fever or chills.  At the ED she was found with normal white blood cell count.  Normal AST/ALT, alkaline phosphatase and bilirubin.  There is no significant electrolyte disturbance.  She had CT scan of the abdomen and pelvis with that does not show any intra-abdominal pathology.  She had an ultrasound 2 days ago that shows the bladder is a large.  I personally evaluated the images of the abdominal CT scan and abdominal ultrasound.  She has been previously evaluated by general surgery 2 weeks ago and she was told that her pain most likely was coming from her gallbladder.  Ultrasound was ordered as outpatient.  She was unable to do the outpatient ultrasound.  Surgery is consulted by Dr. Fuller Plan in this context for evaluation and management of acute cholecystitis.  PAST MEDICAL HISTORY (PMH):  Past Medical History:  Diagnosis Date   Migraine     PAST SURGICAL HISTORY (PSH):  Past Surgical History:  Procedure Laterality Date   CESAREAN SECTION     x2   CESAREAN SECTION WITH BILATERAL TUBAL LIGATION N/A 09/22/2021   Procedure: Repeat CESAREAN SECTION WITH BILATERAL TUBAL LIGATION;  Surgeon: Candice Camp, MD;  Location: [facility] LD ORS;  Service: Obstetrics;  Laterality: N/A;   TONSILLECTOMY      MEDICATIONS:  Prior to Admission medications   Medication Sig Start Date End Date Taking? Authorizing Provider  acetaminophen (TYLENOL) 325 MG tablet  Take 2 tablets (650 mg total) by mouth every 4 (four) hours as needed for mild pain (temperature > 101.5.). 09/24/21   Lyn Henri, MD  ibuprofen (ADVIL) 600 MG tablet Take 1 tablet (600 mg total) by mouth every 6 (six) hours. 09/24/21   Lyn Henri, MD  ondansetron (ZOFRAN-ODT) 4 MG disintegrating tablet Take 1 tablet (4 mg total) by mouth every 8 (eight) hours as needed for nausea or vomiting. 09/12/22   Roxy Horseman, PA-C  oxyCODONE (ROXICODONE) 5 MG immediate release tablet Take 1 tablet (5 mg total) by mouth every 4 (four) hours as needed for severe pain. 09/24/21   Lyn Henri, MD  oxyCODONE-acetaminophen (PERCOCET) 5-325 MG tablet Take 1-2 tablets by mouth every 6 (six) hours as needed. 09/12/22   Roxy Horseman, PA-C     ALLERGIES:  Allergies  Allergen Reactions   Penicillins Hives   Tamiflu [Oseltamivir Phosphate] Hives   Tamiflu [Oseltamivir] Hives     SOCIAL HISTORY:  Social History   Socioeconomic History   Marital status: Married    Spouse name: Not on file   Number of children: Not on file   Years of education: Not on file   Highest education level: Not on file  Occupational History   Not on file  Tobacco Use   Smoking status: Former   Smokeless tobacco: Never  Vaping Use   Vaping Use: Not on file  Substance and Sexual Activity   Alcohol use:  Not Currently   Drug use: Not Currently   Sexual activity: Yes  Other Topics Concern   Not on file  Social History Narrative   Not on file   Social Determinants of Health   Financial Resource Strain: Not on file  Food Insecurity: Not on file  Transportation Needs: Not on file  Physical Activity: Not on file  Stress: Not on file  Social Connections: Not on file  Intimate Partner Violence: Not on file     FAMILY HISTORY:  Family History  Problem Relation Age of Onset   Cancer Mother    Stroke Father      REVIEW OF SYSTEMS:  Constitutional: denies weight loss, fever, chills, or  sweats  Eyes: denies any other vision changes, history of eye injury  ENT: denies sore throat, hearing problems  Respiratory: denies shortness of breath, wheezing  Cardiovascular: denies chest pain, palpitations  Gastrointestinal: positive abdominal pain, nausea and vomitnig Genitourinary: denies burning with urination or urinary frequency Musculoskeletal: denies any other joint pains or cramps  Skin: denies any other rashes or skin discolorations  Neurological: denies any other headache, dizziness, weakness  Psychiatric: denies any other depression, anxiety   All other review of systems were negative   VITAL SIGNS:  Temp:  [97.8 F (36.6 C)-98.1 F (36.7 C)] 97.8 F (36.6 C) (11/14 0353) Pulse Rate:  [75-112] 78 (11/14 0353) Resp:  [16-20] 20 (11/14 0353) BP: (115-155)/(67-81) 115/77 (11/14 0353) SpO2:  [96 %-100 %] 96 % (11/14 0353) Weight:  [59 kg] 59 kg (11/14 0223)     Height: 5\' 2"  (157.5 cm) Weight: 59 kg BMI (Calculated): 23.78   INTAKE/OUTPUT:  This shift: No intake/output data recorded.  Last 2 shifts: @IOLAST2SHIFTS @   PHYSICAL EXAM:  Constitutional:  -- Normal body habitus  -- Awake, alert, and oriented x3  Eyes:  -- Pupils equally round and reactive to light  -- No scleral icterus  Ear, nose, and throat:  -- No jugular venous distension  Pulmonary:  -- No crackles  -- Equal breath sounds bilaterally -- Breathing non-labored at rest Cardiovascular:  -- S1, S2 present  -- No pericardial rubs Gastrointestinal:  -- Abdomen soft, tender to palpation in the right upper quadrant, non-distended, no guarding or rebound tenderness -- No abdominal masses appreciated, pulsatile or otherwise  Musculoskeletal and Integumentary:  -- Wounds: None appreciated -- Extremities: B/L UE and LE FROM, hands and feet warm, no edema  Neurologic:  -- Motor function: intact and symmetric -- Sensation: intact and symmetric   Labs:     Latest Ref Rng & Units 09/13/2022     7:48 PM 09/11/2022   12:00 PM 09/28/2021    4:06 AM  CBC  WBC 4.0 - 10.5 K/uL 9.3  8.7  10.5   Hemoglobin 12.0 - 15.0 g/dL 11.9  12.3  9.8   Hematocrit 36.0 - 46.0 % 36.7  38.9  30.8   Platelets 150 - 400 K/uL 368  365  354       Latest Ref Rng & Units 09/13/2022    7:48 PM 09/11/2022   12:00 PM 09/28/2021    4:06 AM  CMP  Glucose 70 - 99 mg/dL 97  100  87   BUN 6 - 20 mg/dL 11  7  11    Creatinine 0.44 - 1.00 mg/dL 0.67  0.65  0.67   Sodium 135 - 145 mmol/L 137  140  135   Potassium 3.5 - 5.1 mmol/L 3.8  4.3  3.8  Chloride 98 - 111 mmol/L 105  108  102   CO2 22 - 32 mmol/L 24  24  25    Calcium 8.9 - 10.3 mg/dL 9.0  9.4  7.2   Total Protein 6.5 - 8.1 g/dL 7.8  7.1  5.9   Total Bilirubin 0.3 - 1.2 mg/dL 0.6  0.4  0.4   Alkaline Phos 38 - 126 U/L 99  98  155   AST 15 - 41 U/L 21  19  37   ALT 0 - 44 U/L 12  14  46     Imaging studies:  EXAM: ULTRASOUND ABDOMEN LIMITED RIGHT UPPER QUADRANT   COMPARISON:  Abdominal ultrasound September 27, 2021   FINDINGS: Gallbladder:   There is mobile echogenic debris within the gallbladder. No gallstones or wall thickening visualized. No sonographic Murphy sign noted by sonographer.   Common bile duct:   Diameter: 3 mm   Liver:   No focal lesion identified. Within normal limits in parenchymal echogenicity. Portal vein is patent on color Doppler imaging with normal direction of blood flow towards the liver.   Other: None.   IMPRESSION: Gallbladder sludge but no evidence of cholelithiasis or acute cholecystitis.     Electronically Signed   By: September 29, 2021 M.D.   On: 09/11/2022 13:05  Assessment/Plan:  32 y.o. female with biliary colic that is progressing to acute cholecystitis.  Patient with history, physical exam consistent with acute cholecystitis.  Even though images are not conclusive of acute cholecystitis, clinically patient with tenderness in the right upper quadrant that has been getting worse the last few  days.  Patient oriented about diagnosis and surgical management as treatment.   Discussed the risk of surgery including post-op infxn, seroma, biloma, chronic pain, poor-delayed wound healing, retained gallstone, conversion to open procedure, post-op SBO or ileus, and need for additional procedures to address said risks.  The risks of general anesthetic including MI, CVA, sudden death or even reaction to anesthetic medications also discussed. Alternatives include continued observation.  Benefits include possible symptom relief, prevention of complications including acute cholecystitis, pancreatitis.  30, MD

## 2022-09-14 NOTE — Anesthesia Preprocedure Evaluation (Signed)
Anesthesia Evaluation  Patient identified by MRN, date of birth, ID band Patient awake    Reviewed: Allergy & Precautions, NPO status , Patient's Chart, lab work & pertinent test results  History of Anesthesia Complications Negative for: history of anesthetic complications  Airway Mallampati: I  TM Distance: >3 FB Neck ROM: full    Dental  (+) Dental Advidsory Given, Teeth Intact   Pulmonary neg pulmonary ROS, Patient abstained from smoking., former smoker   Pulmonary exam normal        Cardiovascular (-) angina (-) Past MI and (-) CABG negative cardio ROS Normal cardiovascular exam     Neuro/Psych negative neurological ROS  negative psych ROS   GI/Hepatic negative GI ROS, Neg liver ROS,,,  Endo/Other  negative endocrine ROS    Renal/GU      Musculoskeletal   Abdominal   Peds  Hematology negative hematology ROS (+)   Anesthesia Other Findings Past Medical History: No date: Migraine  Past Surgical History: No date: CESAREAN SECTION     Comment:  x2 09/22/2021: CESAREAN SECTION WITH BILATERAL TUBAL LIGATION; N/A     Comment:  Procedure: Repeat CESAREAN SECTION WITH BILATERAL TUBAL               LIGATION;  Surgeon: Candice Camp, MD;  Location: [facility] LD               ORS;  Service: Obstetrics;  Laterality: N/A; No date: TONSILLECTOMY  BMI    Body Mass Index: 24.33 kg/m      Reproductive/Obstetrics negative OB ROS                             Anesthesia Physical Anesthesia Plan  ASA: 1  Anesthesia Plan: General ETT   Post-op Pain Management:    Induction: Intravenous  PONV Risk Score and Plan: 4 or greater and Ondansetron, Midazolam and Dexamethasone  Airway Management Planned: Oral ETT  Additional Equipment:   Intra-op Plan:   Post-operative Plan: Extubation in OR  Informed Consent: I have reviewed the patients History and Physical, chart, labs and discussed the  procedure including the risks, benefits and alternatives for the proposed anesthesia with the patient or authorized representative who has indicated his/her understanding and acceptance.     Dental Advisory Given  Plan Discussed with: Anesthesiologist, CRNA and Surgeon  Anesthesia Plan Comments: (Patient consented for risks of anesthesia including but not limited to:  - adverse reactions to medications - damage to eyes, teeth, lips or other oral mucosa - nerve damage due to positioning  - sore throat or hoarseness - Damage to heart, brain, nerves, lungs, other parts of body or loss of life  Patient voiced understanding.)       Anesthesia Quick Evaluation

## 2022-09-14 NOTE — Anesthesia Procedure Notes (Signed)
Procedure Name: Intubation Date/Time: 09/14/2022 1:35 PM  Performed by: Philbert Riser, CRNAPre-anesthesia Checklist: Patient identified, Patient being monitored, Timeout performed, Emergency Drugs available and Suction available Patient Re-evaluated:Patient Re-evaluated prior to induction Oxygen Delivery Method: Circle system utilized Preoxygenation: Pre-oxygenation with 100% oxygen Induction Type: IV induction Ventilation: Mask ventilation without difficulty Laryngoscope Size: 3 and McGraph Grade View: Grade I Tube type: Oral Tube size: 6.5 mm Number of attempts: 1 Airway Equipment and Method: Stylet Placement Confirmation: ETT inserted through vocal cords under direct vision, positive ETCO2 and breath sounds checked- equal and bilateral Secured at: 21 cm Tube secured with: Tape Dental Injury: Teeth and Oropharynx as per pre-operative assessment

## 2022-09-15 MED ORDER — HYDROCODONE-ACETAMINOPHEN 5-325 MG PO TABS: 1.0000 | ORAL_TABLET | ORAL | 0 refills | Status: AC | PRN

## 2022-09-15 NOTE — Discharge Summary (Signed)
  Patient ID: CHISTINE DEMATTEO MRN: [id number] DOB/AGE: [birth year] 31 y.o.  Admit date: 09/13/2022 Discharge date: 09/15/2022   Discharge Diagnoses:  Principal Problem:   Acute cholecystitis   Procedures: Robotic assisted laparoscopic cholecystectomy  Hospital Course: Patient admitted with acute cholecystitis.  She underwent robotic cholecystectomy.  She has been recovering adequately.  This morning the pain is controlled.  Patient ambulating.  Patient tolerated breakfast.  The wounds are dry and clean.  Physical Exam Vitals reviewed.  Constitutional:      Appearance: She is well-developed.  HENT:     Head: Normocephalic.  Cardiovascular:     Rate and Rhythm: Normal rate and regular rhythm.  Pulmonary:     Effort: Pulmonary effort is normal.  Abdominal:     General: Abdomen is flat. Bowel sounds are normal.     Palpations: Abdomen is soft.  Skin:    General: Skin is warm.  Neurological:     Mental Status: She is alert and oriented to person, place, and time.      Consults: None  Disposition: Discharge disposition: 01-Home or Self Care       Discharge Instructions     Diet - low sodium heart healthy   Complete by: As directed    Increase activity slowly   Complete by: As directed       Allergies as of 09/15/2022       Reactions   Penicillins Hives   Tamiflu [oseltamivir Phosphate] Hives   Tamiflu [oseltamivir] Hives        Medication List     STOP taking these medications    oxyCODONE 5 MG immediate release tablet Commonly known as: Roxicodone       TAKE these medications    acetaminophen 325 MG tablet Commonly known as: TYLENOL Take 2 tablets (650 mg total) by mouth every 4 (four) hours as needed for mild pain (temperature > 101.5.).   HYDROcodone-acetaminophen 5-325 MG tablet Commonly known as: Norco Take 1 tablet by mouth every 4 (four) hours as needed for up to 3 days for moderate pain.   ibuprofen 600 MG tablet Commonly known  as: ADVIL Take 1 tablet (600 mg total) by mouth every 6 (six) hours.   ondansetron 4 MG disintegrating tablet Commonly known as: ZOFRAN-ODT Take 1 tablet (4 mg total) by mouth every 8 (eight) hours as needed for nausea or vomiting.   oxyCODONE-acetaminophen 5-325 MG tablet Commonly known as: Percocet Take 1-2 tablets by mouth every 6 (six) hours as needed.        Follow-up Information     Carolan Shiver, MD Follow up in 2 week(s).   Specialty: General Surgery Why: Follow up after cholecystectomy Contact information: [address] [place] [address] [phone]

## 2022-09-15 NOTE — TOC Initial Note (Signed)
Transition of Care [facility]) - Initial/Assessment Note    Patient Details  Name: Adriana Harrell MRN: [id number] Date of Birth: [birth year]  Transition of Care [facility]) CM/SW Contact:    Chapman Fitch, RN Phone Number: 09/15/2022, 11:55 AM  Clinical Narrative:                       Transition of Care (TOC) Screening Note   Patient Details  Name: Adriana Harrell Date of Birth: [birth year]   Transition of Care [facility]) CM/SW Contact:    Chapman Fitch, RN Phone Number: 09/15/2022, 11:55 AM    Transition of Care Department [facility]) has reviewed patient and no TOC needs have been identified at this time. We will continue to monitor patient advancement through interdisciplinary progression rounds. If new patient transition needs arise, please place a TOC consult.     Patient Goals and CMS Choice        Expected Discharge Plan and Services           Expected Discharge Date: 09/15/22                                    Prior Living Arrangements/Services                       Activities of Daily Living Home Assistive Devices/Equipment: None ADL Screening (condition at time of admission) Patient's cognitive ability adequate to safely complete daily activities?: Yes Is the patient deaf or have difficulty hearing?: No Does the patient have difficulty seeing, even when wearing glasses/contacts?: No Does the patient have difficulty concentrating, remembering, or making decisions?: No Patient able to express need for assistance with ADLs?: Yes Does the patient have difficulty dressing or bathing?: No Independently performs ADLs?: Yes (appropriate for developmental age) Does the patient have difficulty walking or climbing stairs?: No Weakness of Legs: None Weakness of Arms/Hands: None  Permission Sought/Granted                  Emotional Assessment              Admission diagnosis:  Acute cholecystitis [K81.0] Biliary sludge [K83.8] Patient  Active Problem List   Diagnosis Date Noted   Acute cholecystitis 09/13/2022   Preeclampsia 09/26/2021   Previous cesarean section 09/22/2021   Preop testing 09/21/2021   Chronic migraine 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 status migrainosus, not intractable 07/29/2021   Persistent headaches 07/29/2021   Intracranial hypertension, benign 07/21/2021   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) 07/21/2021   Pregnancy 07/11/2021   Pseudotumor cerebri 07/10/2021   Supervision of normal IUP (intrauterine pregnancy) in multigravida, unspecified trimester 07/10/2021   Pseudotumor 07/10/2021   PCP:  Lance Morin, MD Pharmacy:   [id number] [place], [place] - [address] AT [facility] OF [address] [place] [address] Phone: [phone] Fax: [phone]     Social Determinants of Health (SDOH) Interventions    Readmission Risk Interventions     No data to display

## 2022-09-15 NOTE — Discharge Instructions (Signed)

## 2022-09-16 LAB — SURGICAL PATHOLOGY

## 2023-06-15 ENCOUNTER — Encounter: Payer: Self-pay | Admitting: Adult Health

## 2023-06-15 ENCOUNTER — Ambulatory Visit: Payer: BC Managed Care – PPO | Admitting: Adult Health

## 2023-06-15 VITALS — BP 119/79 | HR 104 | Ht 63.0 in | Wt 125.0 lb

## 2023-06-15 DIAGNOSIS — M542 Cervicalgia: Secondary | ICD-10-CM

## 2023-06-15 DIAGNOSIS — M5412 Radiculopathy, cervical region: Secondary | ICD-10-CM

## 2023-06-15 DIAGNOSIS — R55 Syncope and collapse: Secondary | ICD-10-CM

## 2023-06-15 DIAGNOSIS — R519 Headache, unspecified: Secondary | ICD-10-CM | POA: Diagnosis not present

## 2023-06-15 DIAGNOSIS — R42 Dizziness and giddiness: Secondary | ICD-10-CM

## 2023-06-15 NOTE — Progress Notes (Signed)
PATIENT: Adriana Harrell DOB: 07/18/1991  REASON FOR VISIT: follow up HISTORY FROM: patient PRIMARY NEUROLOGIST: Dr. Terrace Arabia  Chief Complaint  Patient presents with   Headache    Rm 5 alone  Pt is well, reports she has had about 2 migraines in the past 6 months but she has a headache at base of scull 5-4 days a week. Headaches worsen when she is on her feet a while      HISTORY OF PRESENT ILLNESS:  (Previous note from Dr. Terrace Arabia) 07/29/21: Adriana Harrell, is a 32 year old female, seen in request by Dr. Dione Booze, for evaluation of possible intracranial hypertension, her primary care physician is Dr. Freda Jackson, Wilmer Floor, MD, initial evaluation was on July 21, 2021   I reviewed and summarized the referring note.   She is currently [redacted] weeks pregnant, has children at age 56, 33, no problem with previous pregnancy,   She reported frequent migraine headache when she was younger, but gradually tapered off, previous migraine often preceded by visual aura, lateralized severe pounding headache with light noise sensitivity, nauseous  In early September 2022, she has developed severe holoacranial headaches, pounding, was evaluated [facility] September 2, some relief with Reglan and Fioricet, but headache returned, severe, holoacranial, with light, noise sensitivity, also during intense headache complains of blurry vision,  She presented to emergency room again on July 09, 2021, concerned about possibility of intracranial hypertension, abnormal MRI of the brain, MRV of the brain, lumbar puncture in lateral recumbent position, reported to be 24 cmH2O, close pressure was 18 cmH2O  She received Solu-Medrol, Flexeril, magnesium, Reglan, with limited improvement of her headache, does report some improvement after lumbar puncture,  Concerning for possible idiopathic intracranial hypertension, she was put on Diamox, currently taking 500 mg 3 tablets twice a day,  She now complains of 5 out of  10 constant holoacranial pressure headaches, occasionally escalate to 8 out of 10, with light noise sensitive, worsening by movement, has been taking frequent Reglan, and Tylenol with limited help   UPDATE Sept 28 2022: The nerve block did help her headache for 3 days,  headache has came back, more intense than previous week since Sept 25th, holocranial headache, sometimes, really severe, she tried phenergan, Tylenol Flexeril and sleep, which did help her some.     She tried Stadol nasal pray on Sept 28th, did help some, previously tried Fioricet with limited help   I reviewed ophthalmology evaluation by Dr. Georges Mouse on July 24, 2021, no disc edema was seen, visual field clear OU, no evidence of disc edema on more sensitive measure OCT RNFL, no ophthalmology sign of increased intracranial pressure,     She is no longer taking Diamox  Today 06/15/23:  ADYLYNN Harrell is a 32 y.o. female who has been followed in this office for persistent headaches. Last seen by Dr. Terrace Arabia on 07/29/21.   She has reported that she has been to the doctor several times in the last 2 weeks.  She has been having a multitude of symptoms including dizziness, presyncope, neck pain, brain fog, nausea, loss of appetite and fatigue.  She reports that the episodes of dizziness sometimes feels like vertigo but other times she feels like she may pass out.  She did go see her PCP who checked blood work and recommended that she wear a cardiac monitor.  She has not gotten the cardiac monitor or call about this.  She also reports that blood work showed  low iron.  Reports pain in the neck that radiates to the occipital region.  It does get worse as the day goes on. Pain will often radiate to the left side of the neck and into the face. Reports nausea and loss of appetite. Feels that she has brain fog all the time. Feels like she has a whoosing side on the left side when she is up walking better when laying down. Also has  tingling that goes down the left arm. She went to urgent care for neck pain- they gave her steroids but symptoms persistent.   When she bends over- she will have clear fluid dripping out of her nose on the left side.  Reports that she does have seasonal allergies.  She currently works as an Charity fundraiser at  [employer]   REVIEW OF SYSTEMS: Out of a complete 14 system review of symptoms, the patient complains only of the following symptoms, and all other reviewed systems are negative.  ALLERGIES: Allergies  Allergen Reactions   Penicillins Hives   Tamiflu [Oseltamivir Phosphate] Hives   Tamiflu [Oseltamivir] Hives    HOME MEDICATIONS: Outpatient Medications Prior to Visit  Medication Sig Dispense Refill   acetaminophen (TYLENOL) 325 MG tablet Take 2 tablets (650 mg total) by mouth every 4 (four) hours as needed for mild pain (temperature > 101.5.). 30 tablet 0   ibuprofen (ADVIL) 600 MG tablet Take 1 tablet (600 mg total) by mouth every 6 (six) hours. (Patient not taking: Reported on 09/14/2022) 30 tablet 0   ondansetron (ZOFRAN-ODT) 4 MG disintegrating tablet Take 1 tablet (4 mg total) by mouth every 8 (eight) hours as needed for nausea or vomiting. 10 tablet 0   oxyCODONE-acetaminophen (PERCOCET) 5-325 MG tablet Take 1-2 tablets by mouth every 6 (six) hours as needed. 15 tablet 0   No facility-administered medications prior to visit.    PAST MEDICAL HISTORY: Past Medical History:  Diagnosis Date   Migraine     PAST SURGICAL HISTORY: Past Surgical History:  Procedure Laterality Date   CESAREAN SECTION     x2   CESAREAN SECTION WITH BILATERAL TUBAL LIGATION N/A 09/22/2021   Procedure: Repeat CESAREAN SECTION WITH BILATERAL TUBAL LIGATION;  Surgeon: Candice Camp, MD;  Location: [facility] LD ORS;  Service: Obstetrics;  Laterality: N/A;   TONSILLECTOMY      FAMILY HISTORY: Family History  Problem Relation Age of Onset   Cancer Mother    Stroke Father     SOCIAL HISTORY: Social History    Socioeconomic History   Marital status: Married    Spouse name: Not on file   Number of children: Not on file   Years of education: Not on file   Highest education level: Not on file  Occupational History   Not on file  Tobacco Use   Smoking status: Former   Smokeless tobacco: Never  Vaping Use   Vaping status: Not on file  Substance and Sexual Activity   Alcohol use: Not Currently   Drug use: Not Currently   Sexual activity: Yes  Other Topics Concern   Not on file  Social History Narrative   Not on file   Social Determinants of Health   Financial Resource Strain: Medium Risk (06/13/2023)   Received from [facility]   Overall Financial Resource Strain (CARDIA)    Difficulty of Paying Living Expenses: Somewhat hard  Food Insecurity: Food Insecurity Present (06/13/2023)   Received from [facility]   Hunger Vital Sign  Worried About Programme researcher, broadcasting/film/video in the Last Year: Sometimes true    [employer] of Food in the Last Year: Never true  Transportation Needs: No Transportation Needs (06/13/2023)   Received from [facility]   PRAPARE - Transportation    Lack of Transportation (Medical): No    Lack of Transportation (Non-Medical): No  Physical Activity: Not on file  Stress: Not on file  Social Connections: Not on file  Intimate Partner Violence: Not on file      PHYSICAL EXAM  Vitals:   06/15/23 0954  BP: 119/79  Pulse: (!) 104  Weight: 125 lb (56.7 kg)  Height: 5\' 3"  (1.6 m)   Body mass index is 22.14 kg/m.  Generalized: Well developed, in no acute distress   Neurological examination  Mentation: Alert oriented to time, place, history taking. Follows all commands speech and language fluent Cranial nerve II-XII: Pupils were equal round reactive to light. Extraocular movements were full, visual field were full on confrontational test. Facial sensation and strength were normal. Uvula tongue midline. Head turning and shoulder shrug  were normal and  symmetric.  I did have the patient bend over with a gauze on her nose to see if she had fluid leaking out of the nostrils- no fluid today  Motor: The motor testing reveals 5 over 5 strength of all 4 extremities. Good symmetric motor tone is noted throughout.  Sensory: Sensory testing is intact to soft touch on all 4 extremities. No evidence of extinction is noted.  Coordination: Cerebellar testing reveals good finger-nose-finger and heel-to-shin bilaterally.  Gait and station: Gait is normal. Romberg is negative. No drift is seen.  Reflexes: Deep tendon reflexes are symmetric and normal bilaterally.   DIAGNOSTIC DATA (LABS, IMAGING, TESTING) - I reviewed patient records, labs, notes, testing and imaging myself where available.  Lab Results  Component Value Date   WBC 9.3 09/13/2022   HGB 11.9 (L) 09/13/2022   HCT 36.7 09/13/2022   MCV 78.8 (L) 09/13/2022   PLT 368 09/13/2022      Component Value Date/Time   NA 137 09/13/2022 1948   NA 138 09/15/2012 1556   K 3.8 09/13/2022 1948   K 4.0 09/15/2012 1556   CL 105 09/13/2022 1948   CL 107 09/15/2012 1556   CO2 24 09/13/2022 1948   CO2 22 09/15/2012 1556   GLUCOSE 97 09/13/2022 1948   GLUCOSE 90 09/15/2012 1556   BUN 11 09/13/2022 1948   BUN 8 09/15/2012 1556   CREATININE 0.67 09/13/2022 1948   CREATININE 0.65 09/15/2012 1556   CALCIUM 9.0 09/13/2022 1948   CALCIUM 9.1 09/15/2012 1556   PROT 7.8 09/13/2022 1948   PROT 8.7 (H) 09/15/2012 1556   ALBUMIN 4.5 09/13/2022 1948   ALBUMIN 4.5 09/15/2012 1556   AST 21 09/13/2022 1948   AST 23 09/15/2012 1556   ALT 12 09/13/2022 1948   ALT 18 09/15/2012 1556   ALKPHOS 99 09/13/2022 1948   ALKPHOS 122 09/15/2012 1556   BILITOT 0.6 09/13/2022 1948   BILITOT 0.5 09/15/2012 1556   GFRNONAA >60 09/13/2022 1948   GFRNONAA >60 09/15/2012 1556   GFRAA >60 09/14/2019 0755   GFRAA >60 09/15/2012 1556    Lab Results  Component Value Date   TSH 0.69 09/15/2012      ASSESSMENT AND  PLAN 32 y.o. year old female  has a past medical history of Migraine. here with:  Neck pain/cervical radiculopathy Dizziness/presyncope Headaches  MRI of the brain with  and without contrast ordered MRI of the cervical spine with and without contrast ordered Will consider medication pending results of imaging. Encouraged her to keep close follow-up with her PCP Encouraged her to follow-up and wearing cardiac monitor Follow-up with [place] in 3 to 4 months or sooner if needed      Butch Penny, MSN, NP-C 06/15/2023, 9:49 AM [facility] [address] [place], [place] [address] [phone]

## 2023-06-15 NOTE — Progress Notes (Addendum)
Chart reviewed, agree above plan  Personally reviewed MRI of the brain with and without contrast June 21, 2023 that was essentially normal  MRI of cervical spine with without contrast, C5-6 mild bulging disc, no significant canal foraminal stenosis.

## 2023-06-21 ENCOUNTER — Ambulatory Visit (INDEPENDENT_AMBULATORY_CARE_PROVIDER_SITE_OTHER): Payer: BC Managed Care – PPO

## 2023-06-21 DIAGNOSIS — M542 Cervicalgia: Secondary | ICD-10-CM

## 2023-06-21 DIAGNOSIS — R519 Headache, unspecified: Secondary | ICD-10-CM | POA: Diagnosis not present

## 2023-06-21 DIAGNOSIS — R42 Dizziness and giddiness: Secondary | ICD-10-CM | POA: Diagnosis not present

## 2023-06-21 DIAGNOSIS — R55 Syncope and collapse: Secondary | ICD-10-CM

## 2023-06-21 DIAGNOSIS — M5412 Radiculopathy, cervical region: Secondary | ICD-10-CM

## 2023-06-21 MED ORDER — GADOBENATE DIMEGLUMINE 529 MG/ML IV SOLN
10.0000 mL | Freq: Once | INTRAVENOUS | Status: AC | PRN
  Administered 2023-06-21: 10 mL via INTRAVENOUS

## 2023-06-23 ENCOUNTER — Encounter: Payer: Self-pay | Admitting: Adult Health

## 2023-07-02 ENCOUNTER — Encounter: Payer: Self-pay | Admitting: Adult Health

## 2023-10-11 ENCOUNTER — Telehealth: Payer: Self-pay | Admitting: Adult Health

## 2023-10-11 NOTE — Telephone Encounter (Signed)
Message noted.

## 2023-10-11 NOTE — Telephone Encounter (Signed)
Pt said cancelling appointment and do not need due to now seeing neurologist at [facility]

## 2023-10-13 ENCOUNTER — Ambulatory Visit: Payer: BC Managed Care – PPO | Admitting: Neurology

## 2024-01-06 ENCOUNTER — Emergency Department
Admission: EM | Admit: 2024-01-06 | Discharge: 2024-01-06 | Disposition: A | Discharge status: Home / Self Care | Payer: PRIVATE HEALTH INSURANCE | Attending: Emergency Medicine | Admitting: Emergency Medicine

## 2024-01-06 ENCOUNTER — Encounter: Payer: Self-pay | Admitting: Emergency Medicine

## 2024-01-06 ENCOUNTER — Other Ambulatory Visit: Payer: Self-pay

## 2024-01-06 DIAGNOSIS — K529 Noninfective gastroenteritis and colitis, unspecified: Secondary | ICD-10-CM | POA: Diagnosis not present

## 2024-01-06 DIAGNOSIS — R1031 Right lower quadrant pain: Secondary | ICD-10-CM | POA: Diagnosis present

## 2024-01-06 LAB — COMPREHENSIVE METABOLIC PANEL
ALT: 11 U/L (ref 0–44)
AST: 19 U/L (ref 15–41)
Albumin: 4.3 g/dL (ref 3.5–5.0)
Alkaline Phosphatase: 87 U/L (ref 38–126)
Anion gap: 10 (ref 5–15)
BUN: 12 mg/dL (ref 6–20)
CO2: 20 mmol/L — ABNORMAL LOW (ref 22–32)
Calcium: 8.8 mg/dL — ABNORMAL LOW (ref 8.9–10.3)
Chloride: 106 mmol/L (ref 98–111)
Creatinine, Ser: 0.59 mg/dL (ref 0.44–1.00)
GFR, Estimated: >60 mL/min (ref >60)
Glucose, Bld: 102 mg/dL — ABNORMAL HIGH (ref 70–99)
Potassium: 3.7 mmol/L (ref 3.5–5.1)
Sodium: 136 mmol/L (ref 135–145)
Total Bilirubin: 0.5 mg/dL (ref 0.0–1.2)
Total Protein: 7.2 g/dL (ref 6.5–8.1)

## 2024-01-06 LAB — URINALYSIS, ROUTINE W REFLEX MICROSCOPIC
Bilirubin Urine: NEGATIVE
Glucose, UA: NEGATIVE mg/dL
Hgb urine dipstick: NEGATIVE
Ketones, ur: NEGATIVE mg/dL
Leukocytes,Ua: NEGATIVE
Nitrite: NEGATIVE
Protein, ur: NEGATIVE mg/dL
Specific Gravity, Urine: 1.008 (ref 1.005–1.030)
pH: 5 (ref 5.0–8.0)

## 2024-01-06 LAB — CBC
HCT: 37.2 % (ref 36.0–46.0)
Hemoglobin: 12 g/dL (ref 12.0–15.0)
MCH: 26.8 pg (ref 26.0–34.0)
MCHC: 32.3 g/dL (ref 30.0–36.0)
MCV: 83.2 fL (ref 80.0–100.0)
Platelets: 283 10*3/uL (ref 150–400)
RBC: 4.47 MIL/uL (ref 3.87–5.11)
RDW: 14.8 % (ref 11.5–15.5)
WBC: 9.7 10*3/uL (ref 4.0–10.5)
nRBC: 0 % (ref 0.0–0.2)

## 2024-01-06 LAB — PREGNANCY, URINE: Preg Test, Ur: NEGATIVE

## 2024-01-06 LAB — HCG, QUANTITATIVE, PREGNANCY: hCG, Beta Chain, Quant, S: <1 m[IU]/mL (ref <5)

## 2024-01-06 LAB — LIPASE, BLOOD: Lipase: 39 U/L (ref 11–51)

## 2024-01-06 MED ORDER — ONDANSETRON 4 MG PO TBDP
4.0000 mg | ORAL_TABLET | Freq: Once | ORAL | Status: AC
  Administered 2024-01-06: 4 mg via ORAL
  Filled 2024-01-06: qty 1

## 2024-01-06 MED ORDER — KETOROLAC TROMETHAMINE 30 MG/ML IJ SOLN
30.0000 mg | Freq: Once | INTRAMUSCULAR | Status: AC
  Administered 2024-01-06: 30 mg via INTRAMUSCULAR
  Filled 2024-01-06: qty 1

## 2024-01-06 MED ORDER — ACETAMINOPHEN 500 MG PO TABS
1000.0000 mg | ORAL_TABLET | Freq: Once | ORAL | Status: AC
  Administered 2024-01-06: 1000 mg via ORAL
  Filled 2024-01-06: qty 2

## 2024-01-06 MED ORDER — ONDANSETRON 4 MG PO TBDP: 4.0000 mg | ORAL_TABLET | Freq: Three times a day (TID) | ORAL | 0 refills | Status: AC | PRN

## 2024-01-06 NOTE — Discharge Instructions (Addendum)
 We discussed CT imaging to evaluate for appendicitis, ovarian pathology but at this time she would like to hold off given your reassuring blood work.  It is still possible to have appendicitis with reassuring blood work or ovary cyst but is also possible this could be gastroenteritis.  We have opted to hold off on CT imaging and [place] but you can return tomorrow I am here after 3 PM and we can do a CT scan if you have worsening pain or any other concerns.  Take Tylenol 1 g every 8 hours, ibuprofen 600 every 6-8 hours with food to help with pain Zofran to help with nausea

## 2024-01-06 NOTE — ED Provider Notes (Addendum)
 [facility] Provider Note    Event Date/Time   First MD Initiated Contact with Patient 01/06/24 1925     (approximate)   History   Abdominal Pain   HPI  Adriana Harrell is a 33 y.o. female patient is otherwise healthy.  She reports having some diarrhea that started last night and then again continuing today x4.  She reports multiple episodes but no one else is sick at home.  She reported initially some generalized cramping but now she reports pain more focal her right lower abdomen.  She states that she then developed some vomiting today x 1 which is why she presented to the emergency room.  She denies any fevers.  Denies anyone else being sick at home.  She took some ibuprofen earlier today with some relief in symptoms.  She does report history of cyst but does have a tubal ligation and reports no desire for future pregnancies.   Physical Exam   Triage Vital Signs: ED Triage Vitals  Encounter Vitals Group     BP 01/06/24 1719 130/82     Systolic BP Percentile --      Diastolic BP Percentile --      Pulse Rate 01/06/24 1719 98     Resp 01/06/24 1719 18     Temp 01/06/24 1719 98.3 F (36.8 C)     Temp Source 01/06/24 1719 Oral     SpO2 01/06/24 1719 100 %     Weight 01/06/24 1717 125 lb (56.7 kg)     Height 01/06/24 1717 5\' 2"  (1.575 m)     Head Circumference --      Peak Flow --      Pain Score 01/06/24 1717 7     Pain Loc --      Pain Education --      Exclude from Growth Chart --     Most recent vital signs: Vitals:   01/06/24 1719  BP: 130/82  Pulse: 98  Resp: 18  Temp: 98.3 F (36.8 C)  SpO2: 100%     General: Awake, no distress.  CV:  Good peripheral perfusion.  Resp:  Normal effort.  Abd:  No distention.  Tender in the right lower quadrant without any rebound, guarding Other:     ED Results / Procedures / Treatments   Labs (all labs ordered are listed, but only abnormal results are displayed) Labs Reviewed  COMPREHENSIVE  METABOLIC PANEL - Abnormal; Notable for the following components:      Result Value   CO2 20 (*)    Glucose, Bld 102 (*)    Calcium 8.8 (*)    All other components within normal limits  URINALYSIS, ROUTINE W REFLEX MICROSCOPIC - Abnormal; Notable for the following components:   Color, Urine STRAW (*)    APPearance CLEAR (*)    All other components within normal limits  LIPASE, BLOOD  CBC  POC URINE PREG, ED     RADIOLOGY None   PROCEDURES:  Critical Care performed: No  Procedures   MEDICATIONS ORDERED IN ED: Medications  ketorolac (TORADOL) 30 MG/ML injection 30 mg (has no administration in time range)  ondansetron (ZOFRAN-ODT) disintegrating tablet 4 mg (has no administration in time range)  acetaminophen (TYLENOL) tablet 1,000 mg (has no administration in time range)     IMPRESSION / MDM / ASSESSMENT AND PLAN / ED COURSE  I reviewed the triage vital signs and the nursing notes.   Patient's presentation is most consistent  with acute presentation with potential threat to life or bodily function.   Differential includes gastroenteritis, appendicitis, ovarian pathology.  Denies any symptoms of STDs to suggest PID.  hCG was negative lipase normal CMP reassuring CBC reassuring urine without evidence of UTI  Discussed with patient that CT imaging would be the best way to evaluate for appendicitis or if there was concern for potentially an ovarian cyst that we be able to see that on CT imaging or do an ultrasound but the ultrasound would not be as good at looking at the appendix and given the pain in the right lower quadrant I would think we should start off with a CT scan.  She denies any concerns for wanting additional pregnancy she is already had a tubal ligation.  We discussed doing an ultrasound or CT but discussed that with radiology reads taking 3 to 4 hours.  We did discuss that she has no fever, her white count is normal and given the nausea vomiting diarrhea this could  be a GI bug as norovirus is very prevalent here.  However I did explain that it was a little atypical of nobody else in the family was sick.  I discussed doing a CT imaging on her but she reports having small kids at home and would prefer to go home and monitor symptoms over the next 24 hours.  She knows that I am working tomorrow and will return after 3 PM and we will order CT imaging if she continues to have significant pain develops fevers or any other concerns but at this time she want to hold off on CT and monitor her symptoms which I think is reasonable given reassuring vitals and reassuring blood work  I did discuss the risk of missing appendicitis including perforation, the risk of missing an ovarian torsion including losing her ovary but patient states that she does not want any further children.  On repeat assessment after medication patient reports feeling better.  She still has a little bit of mild tenderness in her abdomen mostly on the right lower side.  We again rediscussed CT imaging but she does not want to undergo CT imaging or ultrasound at this time.  FINAL CLINICAL IMPRESSION(S) / ED DIAGNOSES   Final diagnoses:  Gastroenteritis     Rx / DC Orders   ED Discharge Orders          Ordered    ondansetron (ZOFRAN-ODT) 4 MG disintegrating tablet  Every 8 hours PRN        01/06/24 2058             Note:  This document was prepared using Dragon voice recognition software and may include unintentional dictation errors.   Concha Se, MD 01/06/24 4132    Concha Se, MD 01/06/24 2103

## 2024-01-06 NOTE — ED Triage Notes (Signed)
 Patient to ED via POV for right abd pain. Started yesterday. Having N/v/D.

## 2024-01-07 ENCOUNTER — Emergency Department

## 2024-01-07 ENCOUNTER — Emergency Department
Admission: EM | Admit: 2024-01-07 | Discharge: 2024-01-07 | Disposition: A | Discharge status: Home / Self Care | Attending: Emergency Medicine | Admitting: Emergency Medicine

## 2024-01-07 ENCOUNTER — Other Ambulatory Visit: Payer: Self-pay

## 2024-01-07 DIAGNOSIS — K529 Noninfective gastroenteritis and colitis, unspecified: Secondary | ICD-10-CM | POA: Insufficient documentation

## 2024-01-07 DIAGNOSIS — R109 Unspecified abdominal pain: Secondary | ICD-10-CM | POA: Diagnosis present

## 2024-01-07 LAB — COMPREHENSIVE METABOLIC PANEL
ALT: 13 U/L (ref 0–44)
AST: 21 U/L (ref 15–41)
Albumin: 4.3 g/dL (ref 3.5–5.0)
Alkaline Phosphatase: 72 U/L (ref 38–126)
Anion gap: 8 (ref 5–15)
BUN: 11 mg/dL (ref 6–20)
CO2: 23 mmol/L (ref 22–32)
Calcium: 9.1 mg/dL (ref 8.9–10.3)
Chloride: 106 mmol/L (ref 98–111)
Creatinine, Ser: 0.62 mg/dL (ref 0.44–1.00)
GFR, Estimated: >60 mL/min (ref >60)
Glucose, Bld: 106 mg/dL — ABNORMAL HIGH (ref 70–99)
Potassium: 3.9 mmol/L (ref 3.5–5.1)
Sodium: 137 mmol/L (ref 135–145)
Total Bilirubin: 0.8 mg/dL (ref 0.0–1.2)
Total Protein: 7.3 g/dL (ref 6.5–8.1)

## 2024-01-07 LAB — URINALYSIS, ROUTINE W REFLEX MICROSCOPIC
Bilirubin Urine: NEGATIVE
Glucose, UA: NEGATIVE mg/dL
Hgb urine dipstick: NEGATIVE
Ketones, ur: NEGATIVE mg/dL
Leukocytes,Ua: NEGATIVE
Nitrite: NEGATIVE
Protein, ur: NEGATIVE mg/dL
Specific Gravity, Urine: 1.011 (ref 1.005–1.030)
pH: 6 (ref 5.0–8.0)

## 2024-01-07 LAB — CBC
HCT: 37.1 % (ref 36.0–46.0)
Hemoglobin: 12.1 g/dL (ref 12.0–15.0)
MCH: 26.9 pg (ref 26.0–34.0)
MCHC: 32.6 g/dL (ref 30.0–36.0)
MCV: 82.4 fL (ref 80.0–100.0)
Platelets: 264 10*3/uL (ref 150–400)
RBC: 4.5 MIL/uL (ref 3.87–5.11)
RDW: 14.9 % (ref 11.5–15.5)
WBC: 5.8 10*3/uL (ref 4.0–10.5)
nRBC: 0 % (ref 0.0–0.2)

## 2024-01-07 LAB — POC URINE PREG, ED: Preg Test, Ur: NEGATIVE

## 2024-01-07 LAB — LIPASE, BLOOD: Lipase: 32 U/L (ref 11–51)

## 2024-01-07 MED ORDER — IOHEXOL 300 MG/ML  SOLN
100.0000 mL | Freq: Once | INTRAMUSCULAR | Status: AC | PRN
  Administered 2024-01-07: 100 mL via INTRAVENOUS

## 2024-01-07 MED ORDER — ONDANSETRON 4 MG PO TBDP
4.0000 mg | ORAL_TABLET | Freq: Once | ORAL | Status: AC
  Administered 2024-01-07: 4 mg via ORAL
  Filled 2024-01-07: qty 1

## 2024-01-07 MED ORDER — HYDROCODONE-ACETAMINOPHEN 5-325 MG PO TABS
1.0000 | ORAL_TABLET | Freq: Once | ORAL | Status: AC
  Administered 2024-01-07: 1 via ORAL
  Filled 2024-01-07: qty 1

## 2024-01-07 NOTE — ED Provider Notes (Signed)
 [facility] Provider Note    Event Date/Time   First MD Initiated Contact with Patient 01/07/24 [phone]     (approximate)   History   Abdominal Pain   HPI  Adriana Harrell is a 33 y.o. female with PMH of migraines, pseudotumor cerebri who presents for evaluation of abdominal pain.  Patient was seen by this ED yesterday for right lower quadrant abdominal pain.  Blood work, vitals were reassuring so CT imaging was not obtained per patient's preference.  Patient states that her pain is worsening which is what brought her back to the emergency department.  States is still located in the right lower quadrant.  No fevers.  Has been nauseous and has had some vomiting.  Tried taking both Tylenol and ibuprofen before presenting to the ER without relief.      Physical Exam   Triage Vital Signs: ED Triage Vitals  Encounter Vitals Group     BP 01/07/24 0819 (!) 134/94     Systolic BP Percentile --      Diastolic BP Percentile --      Pulse Rate 01/07/24 0819 100     Resp 01/07/24 0819 (!) 108     Temp 01/07/24 0819 97.6 F (36.4 C)     Temp Source 01/07/24 0819 Oral     SpO2 01/07/24 0819 100 %     Weight 01/07/24 0820 125 lb (56.7 kg)     Height 01/07/24 0820 5\' 2"  (1.575 m)     Head Circumference --      Peak Flow --      Pain Score 01/07/24 0820 8     Pain Loc --      Pain Education --      Exclude from Growth Chart --     Most recent vital signs: Vitals:   01/07/24 0819 01/07/24 1104  BP: (!) 134/94 126/73  Pulse: 100 75  Resp: (!) 108 18  Temp: 97.6 F (36.4 C)   SpO2: 100% 100%   General: Awake, no distress.  CV:  Good peripheral perfusion.  RRR. Resp:  Normal effort.  CTAB. Abd:  No distention.  Soft, tenderness to palpation in right lower quadrant, bowel sounds appropriate.    ED Results / Procedures / Treatments   Labs (all labs ordered are listed, but only abnormal results are displayed) Labs Reviewed  COMPREHENSIVE METABOLIC PANEL  - Abnormal; Notable for the following components:      Result Value   Glucose, Bld 106 (*)    All other components within normal limits  URINALYSIS, ROUTINE W REFLEX MICROSCOPIC - Abnormal; Notable for the following components:   Color, Urine YELLOW (*)    APPearance CLEAR (*)    All other components within normal limits  LIPASE, BLOOD  CBC  POC URINE PREG, ED    RADIOLOGY  CT abdomen pelvis obtained, interpreted the images as well as reviewed the radiologist report which is negative for any acute abdominal pelvic abnormalities.  PROCEDURES:  Critical Care performed: No  Procedures   MEDICATIONS ORDERED IN ED: Medications  ondansetron (ZOFRAN-ODT) disintegrating tablet 4 mg (4 mg Oral Given 01/07/24 1003)  HYDROcodone-acetaminophen (NORCO/VICODIN) 5-325 MG per tablet 1 tablet (1 tablet Oral Given 01/07/24 1003)  iohexol (OMNIPAQUE) 300 MG/ML solution 100 mL (100 mLs Intravenous Contrast Given 01/07/24 1120)     IMPRESSION / MDM / ASSESSMENT AND PLAN / ED COURSE  I reviewed the triage vital signs and the nursing notes.  33 year old female presents for evaluation of right lower quadrant pain.  Patient's blood pressure was elevated and she is tachycardic upon presentation.  Patient does appear uncomfortable on exam.  Differential diagnosis includes, but is not limited to, appendicitis, gastroenteritis, pancreatitis, ovarian cyst, ovarian torsion.  Patient's presentation is most consistent with acute complicated illness / injury requiring diagnostic workup.  CBC, CMP and lipase all unremarkable.  Urinalysis is normal.  Pregnancy test is negative.  Will proceed with CT scan given that patient's pain is not improving and she is tender to palpation.  CT was negative for any acute abdominal findings.    Based on patient's report of her symptoms I do feel that this is viral gastroenteritis.  Patient was given return precautions.  We discussed following up  with another healthcare provider if she is not improving after a week.  She already has nausea medication on hand from her ER visit last night.  She voiced understanding, all questions were answered and she was stable at discharge.     FINAL CLINICAL IMPRESSION(S) / ED DIAGNOSES   Final diagnoses:  Gastroenteritis     Rx / DC Orders   ED Discharge Orders     None        Note:  This document was prepared using Dragon voice recognition software and may include unintentional dictation errors.   Cameron Ali, PA-C 01/07/24 1228    Corena Herter, MD 01/07/24 [phone]

## 2024-01-07 NOTE — ED Triage Notes (Signed)
 Pt c/o right lower abd pain since Thursday afternoon. Seen last night; states pain is worse. Rates pain 8/10. Was told to come back today for a CT if pain was no better.

## 2024-01-07 NOTE — Discharge Instructions (Signed)
 Your lab work and CT scan was reassuring.  I believe you have a viral gastroenteritis.  This should improve on its own with time.  If you have diarrhea ongoing for a week please be seen by another healthcare provider.  This could be your primary care, urgent care or the emergency department.

## 2024-01-07 NOTE — ED Notes (Signed)
 The pt is a 32 yof lying supine in the bed in a right sided recumbent position. The pt advised she was seen here last night ref the same but her symptoms have not improved. The pt is warm, pink, and dry. The pt is alert and oriented x 4. The pt does look to be in pain as she is slightly rocking in the bed and grimacing.

## 2024-06-07 ENCOUNTER — Other Ambulatory Visit: Payer: Self-pay

## 2024-06-07 ENCOUNTER — Emergency Department

## 2024-06-07 ENCOUNTER — Emergency Department
Admission: EM | Admit: 2024-06-07 | Discharge: 2024-06-07 | Disposition: A | Discharge status: Home / Self Care | Attending: Emergency Medicine | Admitting: Emergency Medicine

## 2024-06-07 ENCOUNTER — Encounter: Payer: Self-pay | Admitting: Emergency Medicine

## 2024-06-07 DIAGNOSIS — R079 Chest pain, unspecified: Secondary | ICD-10-CM | POA: Diagnosis present

## 2024-06-07 DIAGNOSIS — R42 Dizziness and giddiness: Secondary | ICD-10-CM | POA: Insufficient documentation

## 2024-06-07 DIAGNOSIS — R55 Syncope and collapse: Secondary | ICD-10-CM | POA: Insufficient documentation

## 2024-06-07 DIAGNOSIS — R0789 Other chest pain: Secondary | ICD-10-CM | POA: Diagnosis not present

## 2024-06-07 DIAGNOSIS — R002 Palpitations: Secondary | ICD-10-CM | POA: Diagnosis not present

## 2024-06-07 LAB — BASIC METABOLIC PANEL WITH GFR
Anion gap: 11 (ref 5–15)
BUN: 11 mg/dL (ref 6–20)
CO2: 24 mmol/L (ref 22–32)
Calcium: 9.4 mg/dL (ref 8.9–10.3)
Chloride: 104 mmol/L (ref 98–111)
Creatinine, Ser: 0.87 mg/dL (ref 0.44–1.00)
GFR, Estimated: >60 mL/min (ref >60)
Glucose, Bld: 97 mg/dL (ref 70–99)
Potassium: 3.9 mmol/L (ref 3.5–5.1)
Sodium: 139 mmol/L (ref 135–145)

## 2024-06-07 LAB — CBC
HCT: 37 % (ref 36.0–46.0)
Hemoglobin: 11.7 g/dL — ABNORMAL LOW (ref 12.0–15.0)
MCH: 24.7 pg — ABNORMAL LOW (ref 26.0–34.0)
MCHC: 31.6 g/dL (ref 30.0–36.0)
MCV: 78.2 fL — ABNORMAL LOW (ref 80.0–100.0)
Platelets: 326 K/uL (ref 150–400)
RBC: 4.73 MIL/uL (ref 3.87–5.11)
RDW: 15.8 % — ABNORMAL HIGH (ref 11.5–15.5)
WBC: 6.7 K/uL (ref 4.0–10.5)
nRBC: 0 % (ref 0.0–0.2)

## 2024-06-07 LAB — POC URINE PREG, ED: Preg Test, Ur: NEGATIVE

## 2024-06-07 LAB — D-DIMER, QUANTITATIVE: D-Dimer, Quant: 1.25 ug{FEU}/mL — ABNORMAL HIGH (ref 0.00–0.50)

## 2024-06-07 LAB — MAGNESIUM: Magnesium: 2.1 mg/dL (ref 1.7–2.4)

## 2024-06-07 LAB — TROPONIN I (HIGH SENSITIVITY)
Troponin I (High Sensitivity): <2 ng/L (ref <18)
Troponin I (High Sensitivity): <2 ng/L (ref <18)

## 2024-06-07 MED ORDER — LACTATED RINGERS IV BOLUS
1000.0000 mL | Freq: Once | INTRAVENOUS | Status: AC
  Administered 2024-06-07: 1000 mL via INTRAVENOUS

## 2024-06-07 MED ORDER — MORPHINE SULFATE (PF) 4 MG/ML IV SOLN
4.0000 mg | Freq: Once | INTRAVENOUS | Status: AC
  Administered 2024-06-07: 4 mg via INTRAVENOUS
  Filled 2024-06-07: qty 1

## 2024-06-07 MED ORDER — KETOROLAC TROMETHAMINE 30 MG/ML IJ SOLN
15.0000 mg | Freq: Once | INTRAMUSCULAR | Status: AC
  Administered 2024-06-07: 15 mg via INTRAVENOUS
  Filled 2024-06-07: qty 1

## 2024-06-07 MED ORDER — IOHEXOL 350 MG/ML SOLN
75.0000 mL | Freq: Once | INTRAVENOUS | Status: AC | PRN
  Administered 2024-06-07: 75 mL via INTRAVENOUS

## 2024-06-07 NOTE — ED Provider Notes (Signed)
 [facility] Provider Note    Event Date/Time   First MD Initiated Contact with Patient 06/07/24 1128     (approximate)   History   Dizziness and Chest Pain   HPI  Adriana Harrell is a 33 y.o. female who presents to the ED for evaluation of Dizziness and Chest Pain   Review a clinic visit from March.  Seen for abdominal pain, loose stools hematochezia.  2016 colonoscopy and biopsies were normal  Patient presents to the ED for evaluation of 2 days of intermittent palpitations, presyncope and chest discomfort.  Symptoms are often exertional, developing palpitations, mild chest discomfort and presyncopal dizziness that improves while being seated.  No abdominal pain, emesis or stool changes.  She is on her menstrual period currently without additional vaginal discharge.  No urinary symptoms.  No full syncopal episodes.  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   Physical Exam   Triage Vital Signs: ED Triage Vitals  Encounter Vitals Group     BP 06/07/24 1013 (!) 120/91     Girls Systolic BP Percentile --      Girls Diastolic BP Percentile --      Boys Systolic BP Percentile --      Boys Diastolic BP Percentile --      Pulse Rate 06/07/24 1013 (!) 104     Resp 06/07/24 1013 18     Temp 06/07/24 1013 98.4 F (36.9 C)     Temp Source 06/07/24 1013 Oral     SpO2 06/07/24 1013 98 %     Weight 06/07/24 1012 125 lb (56.7 kg)     Height 06/07/24 1012 5' 3 (1.6 m)     Head Circumference --      Peak Flow --      Pain Score 06/07/24 1012 7     Pain Loc --      Pain Education --      Exclude from Growth Chart --     Most recent vital signs: Vitals:   06/07/24 1132 06/07/24 1507  BP:  101/75  Pulse:  64  Resp:  16  Temp:  98.2 F (36.8 C)  SpO2: 98% 98%    General: Awake, no distress.  Generally well-appearing CV:  Good peripheral perfusion.  Waxing and waning rates, all appear to be sinus, ranging 60s-90s.  RRR without appreciable  murmur on auscultation Resp:  Normal effort.  Clear without wheezing Abd:  No distention.  Soft benign MSK:  No deformity noted.  No swelling or signs of trauma Neuro:  No focal deficits appreciated. Other:     ED Results / Procedures / Treatments   Labs (all labs ordered are listed, but only abnormal results are displayed) Labs Reviewed  CBC - Abnormal; Notable for the following components:      Result Value   Hemoglobin 11.7 (*)    MCV 78.2 (*)    MCH 24.7 (*)    RDW 15.8 (*)    All other components within normal limits  D-DIMER, QUANTITATIVE - Abnormal; Notable for the following components:   D-Dimer, Quant 1.25 (*)    All other components within normal limits  BASIC METABOLIC PANEL WITH GFR  MAGNESIUM   POC URINE PREG, ED  TROPONIN I (HIGH SENSITIVITY)  TROPONIN I (HIGH SENSITIVITY)    EKG Sinus rhythm with a  rate of 98bpm, normal axis and intervals.   RADIOLOGY CXR interpreted by me without evidence of acute cardiopulmonary  pathology.  Official radiology report(s): CT Angio Chest PE W and/or Wo Contrast Result Date: 06/07/2024 CLINICAL DATA:  33 year old with intermittent chest pain. Evaluate for pulmonary embolism. EXAM: CT ANGIOGRAPHY CHEST WITH CONTRAST TECHNIQUE: Multidetector CT imaging of the chest was performed using the standard protocol during bolus administration of intravenous contrast. Multiplanar CT image reconstructions and MIPs were obtained to evaluate the vascular anatomy. RADIATION DOSE REDUCTION: This exam was performed according to the departmental dose-optimization program which includes automated exposure control, adjustment of the mA and/or kV according to patient size and/or use of iterative reconstruction technique. CONTRAST:  75mL OMNIPAQUE  IOHEXOL  350 MG/ML SOLN COMPARISON:  Chest CT 09/24/2018 FINDINGS: Cardiovascular: Satisfactory opacification of the pulmonary arteries to the segmental level. No evidence of pulmonary embolism. Normal heart size.  No pericardial effusion. Mediastinum/Nodes: Normal appearance of the mediastinum. No lymph node enlargement. No axillary lymph node enlargement. Thyroid tissue is unremarkable. Lungs/Pleura: Trachea and mainstem bronchi are patent. No airspace disease or lung consolidation. No pleural effusions. Stable 4 mm subpleural nodule in left lower lobe on image 86/6. Additional small stable nodular densities along the left major fissure. Punctate nodule in the left upper lobe on image 54/6 measures roughly 2 mm and likely incidental in a patient of this age. Upper Abdomen: Images of the upper abdomen are unremarkable. Musculoskeletal: No acute bone abnormality. Review of the MIP images confirms the above findings. IMPRESSION: 1. Negative for a pulmonary embolism. 2. No acute chest abnormality. Electronically Signed   By: Juliene Balder M.D.   On: 06/07/2024 16:27   DG Chest 2 View Result Date: 06/07/2024 CLINICAL DATA:  cp EXAM: DG CHEST 2V COMPARISON:  September 04, 2021 FINDINGS: No focal airspace consolidation, pleural effusion, or pneumothorax. No cardiomegaly. No acute fracture or destructive lesion. Multilevel thoracic osteophytosis. IMPRESSION: No acute cardiopulmonary abnormality. Electronically Signed   By: Rogelia Myers M.D.   On: 06/07/2024 11:33    PROCEDURES and INTERVENTIONS:  .1-3 Lead EKG Interpretation  Performed by: Claudene Rover, MD Authorized by: Claudene Rover, MD     Interpretation: normal     ECG rate:  72   ECG rate assessment: normal     Rhythm: sinus rhythm     Ectopy: none     Conduction: normal     Medications  lactated ringers  bolus 1,000 mL (0 mLs Intravenous Stopped 06/07/24 1332)  ketorolac  (TORADOL ) 30 MG/ML injection 15 mg (15 mg Intravenous Given 06/07/24 1223)  morphine  (PF) 4 MG/ML injection 4 mg (4 mg Intravenous Given 06/07/24 1532)  iohexol  (OMNIPAQUE ) 350 MG/ML injection 75 mL (75 mLs Intravenous Contrast Given 06/07/24 1552)     IMPRESSION / MDM / ASSESSMENT AND PLAN / ED  COURSE  I reviewed the triage vital signs and the nursing notes.  Differential diagnosis includes, but is not limited to, ACS, PTX, PNA, muscle strain/spasm, PE, dissection, anxiety, pleural effusion  {Patient presents with symptoms of an acute illness or injury that is potentially life-threatening.  Generally low risk young woman presents with a few days of exertional discomfort, palpitations and presyncope with a benign workup and suitable for outpatient management.  Essentially normal CBC with a marginal microcytic anemia in the setting of her menstrual period.  Normal metabolic panel, electrolytes, 2 negative troponins does have an elevated D-dimer so we obtained a CTA chest that does not demonstrate any PE or signs of acute pathology.  She is maintained on telemetry without dysrhythmias.  I considered admission for this patient but believe ultimately she would  be suitable for trial of outpatient management with cardiology referral and close ED return precautions..   Clinical Course as of 06/07/24 1643  Thu Jun 07, 2024  1337 Reassessed and discussed elevated D-dimer and workup overall.  Patient's mother now at the bedside and I updated her of plan of care.  They are in agreement.  She reports feeling well [DS]  1641 Reassessed.  Patient reports feeling a little bit better.  We discussed workup overall, no clear etiology for symptoms, though no concerning findings on workup.  We discussed possible etiologies.  We discussed plan of care and she is comfortable going home with cardiology referral and ED return precautions, which I think is reasonable. [DS]    Clinical Course User Index [DS] Claudene Rover, MD     FINAL CLINICAL IMPRESSION(S) / ED DIAGNOSES   Final diagnoses:  Other chest pain  Exertional chest pain  Palpitations  Dizziness     Rx / DC Orders   ED Discharge Orders          Ordered    Ambulatory referral to Cardiology       Comments: If you have not heard from the  Cardiology office within the next 72 hours please call [phone].   06/07/24 1643             Note:  This document was prepared using Dragon voice recognition software and may include unintentional dictation errors.   Claudene Rover, MD 06/07/24 [phone]

## 2024-06-07 NOTE — ED Triage Notes (Signed)
 Pt via POV from home. Pt c/o dizziness and intermittent chest pain since last night. Mid-sternal CP, radiates to her back. Denies any SOB. Denies NVD. Denies cardiac hx. Pt is A&OX4 and NAD, ambulatory to triage with steady gait.

## 2024-06-07 NOTE — Discharge Instructions (Addendum)
 The cardiology office should call you to schedule an appointment in the clinic.  If your symptoms worsen in the meantime such as passing out LOA, chest pain that persists at rest, or any other concerns then please return to the ED

## 2024-07-18 ENCOUNTER — Ambulatory Visit: Payer: PRIVATE HEALTH INSURANCE | Admitting: Cardiology

## 2024-07-19 ENCOUNTER — Encounter: Payer: Self-pay | Admitting: Cardiology

## 2024-07-19 ENCOUNTER — Ambulatory Visit

## 2024-07-19 ENCOUNTER — Ambulatory Visit: Payer: PRIVATE HEALTH INSURANCE | Attending: Cardiology | Admitting: Cardiology

## 2024-07-19 VITALS — BP 110/68 | HR 84 | Ht 62.0 in | Wt 126.0 lb

## 2024-07-19 DIAGNOSIS — R0602 Shortness of breath: Secondary | ICD-10-CM

## 2024-07-19 DIAGNOSIS — R002 Palpitations: Secondary | ICD-10-CM

## 2024-07-19 DIAGNOSIS — R079 Chest pain, unspecified: Secondary | ICD-10-CM | POA: Diagnosis not present

## 2024-07-19 NOTE — Patient Instructions (Signed)
 Medication Instructions:  Your physician recommends that you continue on your current medications as directed. Please refer to the Current Medication list given to you today.   *If you need a refill on your cardiac medications before your next appointment, please call your pharmacy*  Lab Work: No labs ordered today  If you have labs (blood work) drawn today and your tests are completely normal, you will receive your results only by: MyChart Message (if you have MyChart) OR A paper copy in the mail If you have any lab test that is abnormal or we need to change your treatment, we will call you to review the results.  Testing/Procedures: Your physician has requested that you have an echocardiogram. Echocardiography is a painless test that uses sound waves to create images of your heart. It provides your doctor with information about the size and shape of your heart and how well your heart's chambers and valves are working.   You may receive an ultrasound enhancing agent through an IV if needed to better visualize your heart during the echo. This procedure takes approximately one hour.  There are no restrictions for this procedure.  This will take place at [address] [facility]) [address], [place] [address]  Please note: We ask at that you not bring children with you during ultrasound (echo/ vascular) testing. Due to room size and safety concerns, children are not allowed in the ultrasound rooms during exams. Our front office staff cannot provide observation of children in our lobby area while testing is being conducted. An adult accompanying a patient to their appointment will only be allowed in the ultrasound room at the discretion of the ultrasound technician under special circumstances. We apologize for any inconvenience.   ZIO XT- Long Term Monitor Instructions  Your physician has requested you wear a ZIO patch monitor for 14 days.  This is a single patch monitor. [employer]  supplies one patch monitor per enrollment. Additional stickers are not available. Please do not apply patch if you will be having a Nuclear Stress Test, Echocardiogram, Cardiac CT, MRI, or Chest Xray during the period you would be wearing the monitor. The patch cannot be worn during these tests. You cannot remove and re-apply the ZIO XT patch monitor.  Your ZIO patch monitor will be mailed 3 day USPS to your address on file. It may take 3-5 days to receive your monitor after you have been enrolled. Once you have received your monitor, please review the enclosed instructions. Your monitor has already been registered assigning a specific monitor serial number to you.  Billing and Patient Assistance Program Information  We have supplied [employer] with any of your insurance information on file for billing purposes.  [employer] offers a sliding scale Patient Assistance Program for patients that do not have insurance, or whose insurance does not completely cover the cost of the ZIO monitor.  You must apply for the Patient Assistance Program to qualify for this discounted rate.  To apply, please call [employer] at [phone], select option 4, select option 2, ask to apply for Patient Assistance Program. Meredeth will ask your household income, and how many people are in your household. They will quote your out-of-pocket cost based on that information. [employer] will also be able to set up a 17-month, interest-free payment plan if needed.  Applying the monitor   Shave hair from upper left chest.  Hold abrader disc by orange tab. Rub abrader in 40 strokes over the upper left chest as indicated  in your monitor instructions.  Clean area with 4 enclosed alcohol pads. Let dry.  Apply patch as indicated in monitor instructions. Patch will be placed under collarbone on left side of chest with arrow pointing upward.  Rub patch adhesive wings for 2 minutes. Remove white label marked 1. Remove the white label marked 2. Rub  patch adhesive wings for 2 additional minutes.  While looking in a mirror, press and release button in center of patch. A small green light will flash 3-4 times. This will be your only indicator that the monitor has been turned on.  Do not shower for the first 24 hours. You may shower after the first 24 hours.  Press the button if you feel a symptom. You will hear a small click. Record Date, Time and Symptom in the Patient Logbook.  When you are ready to remove the patch, follow instructions on the last 2 pages of Patient Logbook.  Stick patch monitor into the tabs at the bottom of the return box.  Place Patient Logbook in the blue and white box. Use locking tab on box and tape box closed securely. The blue and white box has prepaid postage on it. Please place it in the mailbox as soon as possible. Your physician should have your test results approximately 7-14 days after the monitor has been mailed back to [facility].  Call [facility] Customer Care at [phone] if you have questions regarding your ZIO XT patch monitor.  Call them immediately if you see an orange light blinking on your monitor.  If your monitor falls off in less than 4 days, contact our Monitor department at [phone].  If your monitor becomes loose or falls off after 4 days call [employer] at [phone] for suggestions on securing your monitor.   Follow-Up: At [facility], you and your health needs are our priority.  As part of our continuing mission to provide you with exceptional heart care, our providers are all part of one team.  This team includes your primary Cardiologist (physician) and Advanced Practice Providers or APPs (Physician Assistants and Nurse Practitioners) who all work together to provide you with the care you need, when you need it.  Your next appointment:   3 month(s)  Provider:   You may see Dr. Darliss or one of the following Advanced Practice Providers on your designated Care  Team:   Lonni Meager, NP Lesley Maffucci, PA-C Bernardino Bring, PA-C Cadence [place], PA-C Tylene Lunch, NP Barnie Hila, NP    We recommend signing up for the patient portal called MyChart.  Sign up information is provided on this After Visit Summary.  MyChart is used to connect with patients for Virtual Visits (Telemedicine).  Patients are able to view lab/test results, encounter notes, upcoming appointments, etc.  Non-urgent messages can be sent to your provider as well.   To learn more about what you can do with MyChart, go to [email or web address].

## 2024-07-19 NOTE — Progress Notes (Signed)
 Cardiology Office Note:    Date:  07/19/2024   ID:  Damien DELENA Search, DOB 07/03/1991, MRN [id number]  PCP:  Karn Olam DELENA, MD   [facility] Providers Cardiologist:  None     Referring MD: Claudene Rover, MD   Chief Complaint  Patient presents with   Establish Care    New pt has been doing well with no complaints of chest pain, chest pressure or SOB, medciation reviewed verbally with patient   Adriana Harrell is a 33 y.o. female who is being seen today for the evaluation of palpitations, chest pain at the request of Claudene Rover, MD.   History of Present Illness:    Adriana Harrell is a 33 y.o. female with no significant past medical history presenting with palpitations.  States having rapid heart rates ongoing over the past 1 year or so.  Symptoms occur almost daily.  Associated with dizziness, feeling faint, shortness of breath.  She has checked her heart rates during episodes with her smart watch and noted heart rates of 130s to 140s beats per minute.  Presented to the ED a month ago due to palpitations and chest pain, where workup with troponins, EKG, chest CTA were all unrevealing.  Past Medical History:  Diagnosis Date   Migraine     Past Surgical History:  Procedure Laterality Date   CESAREAN SECTION     x2   CESAREAN SECTION WITH BILATERAL TUBAL LIGATION N/A 09/22/2021   Procedure: Repeat CESAREAN SECTION WITH BILATERAL TUBAL LIGATION;  Surgeon: Marget Lenis, MD;  Location: [facility] LD ORS;  Service: Obstetrics;  Laterality: N/A;   GALLBLADDER SURGERY  09/2022   TONSILLECTOMY      Current Medications: Current Meds  Medication Sig   acetaminophen  (TYLENOL ) 325 MG tablet Take 2 tablets (650 mg total) by mouth every 4 (four) hours as needed for mild pain (temperature > 101.5.).   cetirizine (ZYRTEC) 10 MG tablet Take 10 mg by mouth daily.   ibuprofen  (ADVIL ) 600 MG tablet Take 1 tablet (600 mg total) by mouth every 6 (six) hours.     Allergies:   Penicillins,  Tamiflu [oseltamivir phosphate], and Tamiflu [oseltamivir]   Social History   Socioeconomic History   Marital status: Married    Spouse name: Not on file   Number of children: Not on file   Years of education: Not on file   Highest education level: Not on file  Occupational History   Not on file  Tobacco Use   Smoking status: Former   Smokeless tobacco: Never  Vaping Use   Vaping status: Not on file  Substance and Sexual Activity   Alcohol use: Not Currently   Drug use: Not Currently   Sexual activity: Yes  Other Topics Concern   Not on file  Social History Narrative   Not on file   Social Drivers of Health   Financial Resource Strain: Medium Risk (06/13/2023)   Received from [facility]   Overall Financial Resource Strain (CARDIA)    Difficulty of Paying Living Expenses: Somewhat hard  Food Insecurity: Food Insecurity Present (06/13/2023)   Received from [facility]   Hunger Vital Sign    Within the past 12 months, you worried that your food would run out before you got the money to buy more.: Sometimes true    Within the past 12 months, the food you bought just didn't last and you didn't have money to get more.: Never true  Transportation Needs:  No Transportation Needs (06/13/2023)   Received from [facility]    Lack of Transportation (Medical): No    Lack of Transportation (Non-Medical): No  Physical Activity: Not on file  Stress: Not on file  Social Connections: Not on file     Family History: The patient's family history includes Cancer in her mother; Heart attack in her paternal grandfather and paternal grandmother; Hypertension in her mother; Stroke in her father.  ROS:   Please see the history of present illness.     All other systems reviewed and are negative.  EKGs/Labs/Other Studies Reviewed:    The following studies were reviewed today:  EKG Interpretation Date/Time:  Thursday July 19 2024 08:11:37  EDT Ventricular Rate:  84 PR Interval:  134 QRS Duration:  86 QT Interval:  338 QTC Calculation: 399 R Axis:   59  Text Interpretation: Normal sinus rhythm Normal ECG Confirmed by Darliss Rogue ([id number]) on 07/19/2024 8:33:32 AM    Recent Labs: 01/07/2024: ALT 13 06/07/2024: BUN 11; Creatinine, Ser 0.87; Hemoglobin 11.7; Magnesium  2.1; Platelets 326; Potassium 3.9; Sodium 139  Recent Lipid Panel No results found for: CHOL, TRIG, HDL, CHOLHDL, VLDL, LDLCALC, LDLDIRECT   Risk Assessment/Calculations:     Physical Exam:    VS:  BP 110/68 (BP Location: Left Arm, Patient Position: Sitting)   Pulse 84   Ht 5' 2 (1.575 m)   Wt 126 lb (57.2 kg)   SpO2 97%   BMI 23.05 kg/m     Wt Readings from Last 3 Encounters:  07/19/24 126 lb (57.2 kg)  06/07/24 125 lb (56.7 kg)  01/07/24 125 lb (56.7 kg)     GEN:  Well nourished, well developed in no acute distress HEENT: Normal NECK: No JVD; No carotid bruits CARDIAC: RRR, no murmurs, rubs, gallops RESPIRATORY:  Clear to auscultation without rales, wheezing or rhonchi  ABDOMEN: Soft, non-tender, non-distended MUSCULOSKELETAL:  No edema; No deformity  SKIN: Warm and dry NEUROLOGIC:  Alert and oriented x 3 PSYCHIATRIC:  Normal affect   ASSESSMENT:    1. Palpitations   2. Shortness of breath   3. Chest pain of uncertain etiology    PLAN:    In order of problems listed above:  Palpitations, order cardiac monitor x 2 weeks to evaluate any significant arrhythmias. Shortness of breath associated with palpitations, patient is low cardiac risk.  Obtain echo to rule out any structural abnormalities. Chest pain, not consistent with angina, associated with palpitations.  Echo and cardiac monitor as above.  Follow-up after cardiac testing.     Medication Adjustments/Labs and Tests Ordered: Current medicines are reviewed at length with the patient today.  Concerns regarding medicines are outlined above.  Orders Placed This  Encounter  Procedures   LONG TERM MONITOR (3-14 DAYS)   EKG 12-Lead   ECHOCARDIOGRAM COMPLETE   No orders of the defined types were placed in this encounter.   Patient Instructions  Medication Instructions:  Your physician recommends that you continue on your current medications as directed. Please refer to the Current Medication list given to you today.   *If you need a refill on your cardiac medications before your next appointment, please call your pharmacy*  Lab Work: No labs ordered today  If you have labs (blood work) drawn today and your tests are completely normal, you will receive your results only by: MyChart Message (if you have MyChart) OR A paper copy in the mail If you have any lab  test that is abnormal or we need to change your treatment, we will call you to review the results.  Testing/Procedures: Your physician has requested that you have an echocardiogram. Echocardiography is a painless test that uses sound waves to create images of your heart. It provides your doctor with information about the size and shape of your heart and how well your heart's chambers and valves are working.   You may receive an ultrasound enhancing agent through an IV if needed to better visualize your heart during the echo. This procedure takes approximately one hour.  There are no restrictions for this procedure.  This will take place at [address] [facility], [place] [address]  Please note: We ask at that you not bring children with you during ultrasound (echo/ vascular) testing. Due to room size and safety concerns, children are not allowed in the ultrasound rooms during exams. Our front office staff cannot provide observation of children in our lobby area while testing is being conducted. An adult accompanying a patient to their appointment will only be allowed in the ultrasound room at the discretion of the ultrasound technician under special circumstances. We  apologize for any inconvenience.   ZIO XT- Long Term Monitor Instructions  Your physician has requested you wear a ZIO patch monitor for 14 days.  This is a single patch monitor. [employer] supplies one patch monitor per enrollment. Additional stickers are not available. Please do not apply patch if you will be having a Nuclear Stress Test, Echocardiogram, Cardiac CT, MRI, or Chest Xray during the period you would be wearing the monitor. The patch cannot be worn during these tests. You cannot remove and re-apply the ZIO XT patch monitor.  Your ZIO patch monitor will be mailed 3 day USPS to your address on file. It may take 3-5 days to receive your monitor after you have been enrolled. Once you have received your monitor, please review the enclosed instructions. Your monitor has already been registered assigning a specific monitor serial number to you.  Billing and Patient Assistance Program Information  We have supplied [employer] with any of your insurance information on file for billing purposes.  [employer] offers a sliding scale Patient Assistance Program for patients that do not have insurance, or whose insurance does not completely cover the cost of the ZIO monitor.  You must apply for the Patient Assistance Program to qualify for this discounted rate.  To apply, please call [employer] at [phone], select option 4, select option 2, ask to apply for Patient Assistance Program. Meredeth will ask your household income, and how many people are in your household. They will quote your out-of-pocket cost based on that information. [employer] will also be able to set up a 33-month, interest-free payment plan if needed.  Applying the monitor   Shave hair from upper left chest.  Hold abrader disc by orange tab. Rub abrader in 40 strokes over the upper left chest as indicated in your monitor instructions.  Clean area with 4 enclosed alcohol pads. Let dry.  Apply patch as indicated in monitor instructions. Patch will  be placed under collarbone on left side of chest with arrow pointing upward.  Rub patch adhesive wings for 2 minutes. Remove white label marked 1. Remove the white label marked 2. Rub patch adhesive wings for 2 additional minutes.  While looking in a mirror, press and release button in center of patch. A small green light will flash 3-4 times. This will be your only  indicator that the monitor has been turned on.  Do not shower for the first 24 hours. You may shower after the first 24 hours.  Press the button if you feel a symptom. You will hear a small click. Record Date, Time and Symptom in the Patient Logbook.  When you are ready to remove the patch, follow instructions on the last 2 pages of Patient Logbook.  Stick patch monitor into the tabs at the bottom of the return box.  Place Patient Logbook in the blue and white box. Use locking tab on box and tape box closed securely. The blue and white box has prepaid postage on it. Please place it in the mailbox as soon as possible. Your physician should have your test results approximately 7-14 days after the monitor has been mailed back to [facility].  Call [facility] Customer Care at [phone] if you have questions regarding your ZIO XT patch monitor.  Call them immediately if you see an orange light blinking on your monitor.  If your monitor falls off in less than 4 days, contact our Monitor department at [phone].  If your monitor becomes loose or falls off after 4 days call [employer] at [phone] for suggestions on securing your monitor.   Follow-Up: At [facility], you and your health needs are our priority.  As part of our continuing mission to provide you with exceptional heart care, our providers are all part of one team.  This team includes your primary Cardiologist (physician) and Advanced Practice Providers or APPs (Physician Assistants and Nurse Practitioners) who all work together to provide you with the care  you need, when you need it.  Your next appointment:   3 month(s)  Provider:   You may see Dr. Darliss or one of the following Advanced Practice Providers on your designated Care Team:   Lonni Meager, NP Lesley Maffucci, PA-C Bernardino Bring, PA-C Cadence [place], PA-C Tylene Lunch, NP Barnie Hila, NP    We recommend signing up for the patient portal called MyChart.  Sign up information is provided on this After Visit Summary.  MyChart is used to connect with patients for Virtual Visits (Telemedicine).  Patients are able to view lab/test results, encounter notes, upcoming appointments, etc.  Non-urgent messages can be sent to your provider as well.   To learn more about what you can do with MyChart, go to [email or web address].              Signed, Redell Darliss, MD  07/19/2024 9:05 AM     [facility]

## 2024-08-13 ENCOUNTER — Ambulatory Visit: Payer: Self-pay | Admitting: Cardiology

## 2024-08-13 DIAGNOSIS — R002 Palpitations: Secondary | ICD-10-CM

## 2024-08-13 DIAGNOSIS — R0602 Shortness of breath: Secondary | ICD-10-CM | POA: Diagnosis not present

## 2024-09-11 ENCOUNTER — Ambulatory Visit: Payer: PRIVATE HEALTH INSURANCE | Attending: Cardiology

## 2024-09-11 DIAGNOSIS — R0602 Shortness of breath: Secondary | ICD-10-CM | POA: Diagnosis not present

## 2024-09-11 DIAGNOSIS — R002 Palpitations: Secondary | ICD-10-CM

## 2024-09-11 LAB — ECHOCARDIOGRAM COMPLETE
AR max vel: 2.44 cm2
AV Area VTI: 2.46 cm2
AV Area mean vel: 2.33 cm2
AV Mean grad: 5 mmHg
AV Peak grad: 8.8 mmHg
Ao pk vel: 1.48 m/s
Area-P 1/2: 3.65 cm2
S' Lateral: 3.26 cm
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
## 2024-10-08 ENCOUNTER — Emergency Department
Admission: EM | Admit: 2024-10-08 | Discharge: 2024-10-09 | Disposition: A | Attending: Emergency Medicine | Admitting: Emergency Medicine

## 2024-10-08 ENCOUNTER — Emergency Department

## 2024-10-08 ENCOUNTER — Other Ambulatory Visit: Payer: Self-pay

## 2024-10-08 DIAGNOSIS — R109 Unspecified abdominal pain: Secondary | ICD-10-CM

## 2024-10-08 DIAGNOSIS — N83202 Unspecified ovarian cyst, left side: Secondary | ICD-10-CM

## 2024-10-08 LAB — COMPREHENSIVE METABOLIC PANEL WITH GFR
ALT: 11 U/L (ref 0–44)
AST: 22 U/L (ref 15–41)
Albumin: 4.6 g/dL (ref 3.5–5.0)
Alkaline Phosphatase: 85 U/L (ref 38–126)
Anion gap: 14 (ref 5–15)
BUN: 9 mg/dL (ref 6–20)
CO2: 22 mmol/L (ref 22–32)
Calcium: 9.3 mg/dL (ref 8.9–10.3)
Chloride: 102 mmol/L (ref 98–111)
Creatinine, Ser: 0.68 mg/dL (ref 0.44–1.00)
GFR, Estimated: >60 mL/min (ref >60)
Glucose, Bld: 83 mg/dL (ref 70–99)
Potassium: 4.2 mmol/L (ref 3.5–5.1)
Sodium: 138 mmol/L (ref 135–145)
Total Bilirubin: 0.5 mg/dL (ref 0.0–1.2)
Total Protein: 7.3 g/dL (ref 6.5–8.1)

## 2024-10-08 LAB — URINALYSIS, ROUTINE W REFLEX MICROSCOPIC
Bilirubin Urine: NEGATIVE
Glucose, UA: NEGATIVE mg/dL
Hgb urine dipstick: NEGATIVE
Ketones, ur: 5 mg/dL — AB
Leukocytes,Ua: NEGATIVE
Nitrite: NEGATIVE
Protein, ur: NEGATIVE mg/dL
Specific Gravity, Urine: 1.019 (ref 1.005–1.030)
pH: 5 (ref 5.0–8.0)

## 2024-10-08 LAB — CBC
HCT: 35.4 % — ABNORMAL LOW (ref 36.0–46.0)
Hemoglobin: 11 g/dL — ABNORMAL LOW (ref 12.0–15.0)
MCH: 23.8 pg — ABNORMAL LOW (ref 26.0–34.0)
MCHC: 31.1 g/dL (ref 30.0–36.0)
MCV: 76.5 fL — ABNORMAL LOW (ref 80.0–100.0)
Platelets: 286 K/uL (ref 150–400)
RBC: 4.63 MIL/uL (ref 3.87–5.11)
RDW: 15.9 % — ABNORMAL HIGH (ref 11.5–15.5)
WBC: 8.2 K/uL (ref 4.0–10.5)
nRBC: 0 % (ref 0.0–0.2)

## 2024-10-08 LAB — POC URINE PREG, ED: Preg Test, Ur: NEGATIVE

## 2024-10-08 LAB — LIPASE, BLOOD: Lipase: 22 U/L (ref 11–51)

## 2024-10-08 MED ORDER — LACTATED RINGERS IV BOLUS
1000.0000 mL | Freq: Once | INTRAVENOUS | Status: AC
  Administered 2024-10-08: 1000 mL via INTRAVENOUS

## 2024-10-08 MED ORDER — MORPHINE SULFATE (PF) 4 MG/ML IV SOLN
4.0000 mg | Freq: Once | INTRAVENOUS | Status: AC
  Administered 2024-10-08: 4 mg via INTRAVENOUS
  Filled 2024-10-08: qty 1

## 2024-10-08 MED ORDER — IOHEXOL 300 MG/ML  SOLN
100.0000 mL | Freq: Once | INTRAMUSCULAR | Status: AC | PRN
  Administered 2024-10-08: 100 mL via INTRAVENOUS

## 2024-10-08 MED ORDER — ONDANSETRON HCL 4 MG/2ML IJ SOLN
4.0000 mg | Freq: Once | INTRAMUSCULAR | Status: AC
  Administered 2024-10-08: 4 mg via INTRAVENOUS
  Filled 2024-10-08: qty 2

## 2024-10-08 NOTE — ED Triage Notes (Signed)
 Pt comes in via pov with complains of lower abdominal pain that started this morning. Pt complains of pain 8/10 at this time. Pt with 2 episodes of vomiting, the most recent being at about 3pm.

## 2024-10-08 NOTE — ED Provider Notes (Signed)
 [facility] Provider Note    Event Date/Time   First MD Initiated Contact with Patient 10/08/24 2119     (approximate)   History   Abdominal Pain   HPI  Adriana Harrell is a 33 y.o. female presents to the emergency department with abdominal pain.  Patient endorses abdominal pain to the left lower abdomen that has been ongoing and worsening since Friday.  States that she had some mild intermittent pain on Thursday and Friday had progressively worsening pain.  Went to [facility] and had an ultrasound done and was told that she had a hemorrhagic cyst on her left ovary.  Did not have a CT scan done at that time.  Was given pain medication and told to follow-up as an outpatient with her gynecologist.  States that her pain was slightly improving but then had worsening symptoms, was evaluated with her gynecologist and told that she did not have any findings concerning for a cyst.  Ongoing or worsening pain and was told to come to the emergency department.  Denies fever or chills.  Endorses nausea but no episodes of vomiting.  Denies any diarrhea or blood in her stool.  Prior cholecystectomy and prior C-sections.  No prior history of IBD based on last colonoscopy, questionable history of inflammatory bowel in the past.  No prior history of endometriosis.  No dysuria, urinary urgency or frequency.     Physical Exam   Triage Vital Signs: ED Triage Vitals  Encounter Vitals Group     BP 10/08/24 1830 (!) 142/83     Girls Systolic BP Percentile --      Girls Diastolic BP Percentile --      Boys Systolic BP Percentile --      Boys Diastolic BP Percentile --      Pulse Rate 10/08/24 1830 100     Resp 10/08/24 1830 18     Temp 10/08/24 1830 98.1 F (36.7 C)     Temp Source 10/08/24 1830 Oral     SpO2 10/08/24 1830 100 %     Weight 10/08/24 1830 126 lb (57.2 kg)     Height 10/08/24 1830 5' 2 (1.575 m)     Head Circumference --      Peak Flow --      Pain Score 10/08/24 1833 9      Pain Loc --      Pain Education --      Exclude from Growth Chart --     Most recent vital signs: Vitals:   10/08/24 2130 10/08/24 2250  BP: 109/62   Pulse: 64   Resp: 16   Temp:  98.4 F (36.9 C)  SpO2: 100%     Physical Exam Constitutional:      Appearance: She is well-developed.  HENT:     Head: Atraumatic.  Eyes:     Conjunctiva/sclera: Conjunctivae normal.  Cardiovascular:     Rate and Rhythm: Regular rhythm.  Pulmonary:     Effort: No respiratory distress.  Abdominal:     General: There is no distension.     Tenderness: There is abdominal tenderness in the left lower quadrant. There is no right CVA tenderness or left CVA tenderness.  Musculoskeletal:        General: Normal range of motion.     Cervical back: Normal range of motion.  Skin:    General: Skin is warm.     Capillary Refill: Capillary refill takes less than 2 seconds.  Neurological:     General: No focal deficit present.     Mental Status: She is alert. Mental status is at baseline.     IMPRESSION / MDM / ASSESSMENT AND PLAN / ED COURSE  I reviewed the triage vital signs and the nursing notes.  Differential diagnosis including intra-abdominal abscess, IBD, constipation, small bowel obstruction, pyelonephritis, urinary tract infection, endometriosis.  Have a low suspicion for ovarian torsion.  Reviewed the patient's workup at [facility], had an ultrasound that did not show any signs of torsion, questionable hemorrhagic cyst but no signs of rupture.    RADIOLOGY CT scan abdomen and pelvis with contrast ordered LABS (all labs ordered are listed, but only abnormal results are displayed) Labs interpreted as -    Labs Reviewed  CBC - Abnormal; Notable for the following components:      Result Value   Hemoglobin 11.0 (*)    HCT 35.4 (*)    MCV 76.5 (*)    MCH 23.8 (*)    RDW 15.9 (*)    All other components within normal limits  URINALYSIS, ROUTINE W REFLEX MICROSCOPIC - Abnormal; Notable for  the following components:   Color, Urine YELLOW (*)    APPearance CLOUDY (*)    Ketones, ur 5 (*)    All other components within normal limits  LIPASE, BLOOD  COMPREHENSIVE METABOLIC PANEL WITH GFR  POC URINE PREG, ED     MDM  Patient's lab work with no significant leukocytosis.  Mild chronic anemia that appears microcytic.  Creatinine appears to be at baseline with no significant electrolyte abnormality.  Urine with no signs of a urinary tract infection but does have some ketones in the urine.  Normal LFTs and lipase.  Pregnancy test is negative.  Patient given IV fluids, antiemetics and pain medication.  CT scan pending.  Care transferred to oncoming provider with CT scan pending     PROCEDURES:  Critical Care performed: No  Procedures  Patient's presentation is most consistent with acute presentation with potential threat to life or bodily function.   MEDICATIONS ORDERED IN ED: Medications  lactated ringers  bolus 1,000 mL (has no administration in time range)  morphine  (PF) 4 MG/ML injection 4 mg (has no administration in time range)  ondansetron  (ZOFRAN ) injection 4 mg (4 mg Intravenous Given 10/08/24 2259)    FINAL CLINICAL IMPRESSION(S) / ED DIAGNOSES   Final diagnoses:  Abdominal pain, unspecified abdominal location     Rx / DC Orders   ED Discharge Orders     None        Note:  This document was prepared using Dragon voice recognition software and may include unintentional dictation errors.   Suzanne Kirsch, MD 10/08/24 [phone]

## 2024-10-08 NOTE — ED Provider Notes (Signed)
 CT ABDOMEN PELVIS W CONTRAST Result Date: 10/08/2024 EXAM: CT ABDOMEN AND PELVIS WITH CONTRAST 10/08/2024 11:49:44 PM TECHNIQUE: CT of the abdomen and pelvis was performed with the administration of 100 mL of iohexol  (OMNIPAQUE ) 300 MG/ML solution. Multiplanar reformatted images are provided for review. Automated exposure control, iterative reconstruction, and/or weight-based adjustment of the mA/kV was utilized to reduce the radiation dose to as low as reasonably achievable. COMPARISON: Findings were compared to 01/07/2024. CLINICAL HISTORY: LLQ abdominal pain. FINDINGS: LOWER CHEST: No acute abnormality. LIVER: The liver is unremarkable. GALLBLADDER AND BILE DUCTS: Status post cholecystectomy. No biliary ductal dilatation. SPLEEN: No acute abnormality. PANCREAS: No acute abnormality. ADRENAL GLANDS: No acute abnormality. KIDNEYS, URETERS AND BLADDER: No stones in the kidneys or ureters. No hydronephrosis. No perinephric or periureteral stranding. Urinary bladder is unremarkable. GI AND BOWEL: Appendix normal. The stomach, small bowel, and large bowel are otherwise unremarkable. There is no bowel obstruction. PERITONEUM AND RETROPERITONEUM: No ascites. No free air. VASCULATURE: Aorta is normal in caliber. LYMPH NODES: No lymphadenopathy. REPRODUCTIVE ORGANS: Bilateral tubal ligation clips are in place. Corpus luteum noted within the left ovary. Pelvic organs are otherwise unremarkable. BONES AND SOFT TISSUES: Diastasis of the rectus abdominis musculature with mild protuberance of the intraabdominal contents. No acute osseous abnormality. IMPRESSION: 1. No acute findings in the abdomen or pelvis related to LLQ abdominal pain. Electronically signed by: Dorethia Molt MD 10/08/2024 11:57 PM EST RP Workstation: [id number]     CT imaging reviewed.  Discussed with patient.  Resting very comfortably.  Noted there is a left-sided corpus luteum cyst.  In review of this her history that she describes and imaging [facility]  I suspect she may have had a left-sided hemorrhagic cyst that cause pain.  She is resting quite comfortably now.  She has no findings on exam, degree of pain etc. that would be highly concerning for torsion and the cyst noted is relatively small at this time.  She is comfortable with careful return precautions will follow-up her OB/GYN  Return precautions and treatment recommendations and follow-up discussed with the patient who is agreeable with the plan.    Dicky Anes, MD 10/09/24 [phone]

## 2024-10-09 NOTE — Discharge Instructions (Addendum)
 ? ?  Please return to the emergency room right away if you are to develop a fever, severe nausea, your pain becomes severe or worsens, you are unable to keep food down, begin vomiting any dark or bloody fluid, you develop any dark or bloody stools, feel dehydrated, or other new concerns or symptoms arise. ? ?

## 2024-10-18 ENCOUNTER — Ambulatory Visit: Payer: PRIVATE HEALTH INSURANCE | Admitting: Cardiology
